# Patient Record
Sex: Female | Born: 1955 | Race: White | Hispanic: No | Marital: Married | Smoking: Current every day smoker
Health system: Southern US, Community
[De-identification: ages and names within clinical notes are randomized; demographics above are authoritative.]

## PROBLEM LIST (undated history)

## (undated) DIAGNOSIS — S065X9A Traumatic subdural hemorrhage with loss of consciousness of unspecified duration, initial encounter: Secondary | ICD-10-CM

## (undated) DIAGNOSIS — Z720 Tobacco use: Secondary | ICD-10-CM

## (undated) DIAGNOSIS — F329 Major depressive disorder, single episode, unspecified: Secondary | ICD-10-CM

## (undated) DIAGNOSIS — F99 Mental disorder, not otherwise specified: Secondary | ICD-10-CM

## (undated) DIAGNOSIS — F32A Depression, unspecified: Secondary | ICD-10-CM

## (undated) DIAGNOSIS — R4182 Altered mental status, unspecified: Secondary | ICD-10-CM

## (undated) DIAGNOSIS — J449 Chronic obstructive pulmonary disease, unspecified: Secondary | ICD-10-CM

## (undated) DIAGNOSIS — F1310 Sedative, hypnotic or anxiolytic abuse, uncomplicated: Secondary | ICD-10-CM

## (undated) DIAGNOSIS — G458 Other transient cerebral ischemic attacks and related syndromes: Secondary | ICD-10-CM

## (undated) DIAGNOSIS — K589 Irritable bowel syndrome without diarrhea: Secondary | ICD-10-CM

## (undated) DIAGNOSIS — F419 Anxiety disorder, unspecified: Secondary | ICD-10-CM

## (undated) DIAGNOSIS — R569 Unspecified convulsions: Secondary | ICD-10-CM

## (undated) DIAGNOSIS — D496 Neoplasm of unspecified behavior of brain: Secondary | ICD-10-CM

## (undated) HISTORY — PX: DILATION AND CURETTAGE OF UTERUS: SHX78

## (undated) HISTORY — PX: CARDIAC CATHETERIZATION: SHX172

## (undated) HISTORY — PX: LOOP RECORDER IMPLANT: SHX5954

## (undated) HISTORY — PX: VAGINAL HYSTERECTOMY: SUR661

## (undated) HISTORY — PX: TUBAL LIGATION: SHX77

## (undated) HISTORY — PX: CORONARY ANGIOPLASTY WITH STENT PLACEMENT: SHX49

---

## 2007-02-02 HISTORY — PX: SUBDURAL HEMATOMA EVACUATION VIA CRANIOTOMY: SUR319

## 2010-02-01 DIAGNOSIS — S065X9A Traumatic subdural hemorrhage with loss of consciousness of unspecified duration, initial encounter: Secondary | ICD-10-CM

## 2010-02-01 DIAGNOSIS — D496 Neoplasm of unspecified behavior of brain: Secondary | ICD-10-CM

## 2010-02-01 DIAGNOSIS — S065XAA Traumatic subdural hemorrhage with loss of consciousness status unknown, initial encounter: Secondary | ICD-10-CM

## 2010-02-01 HISTORY — DX: Neoplasm of unspecified behavior of brain: D49.6

## 2010-02-01 HISTORY — DX: Traumatic subdural hemorrhage with loss of consciousness of unspecified duration, initial encounter: S06.5X9A

## 2010-02-01 HISTORY — DX: Traumatic subdural hemorrhage with loss of consciousness status unknown, initial encounter: S06.5XAA

## 2012-01-02 HISTORY — PX: BRAIN SURGERY: SHX531

## 2012-02-02 HISTORY — PX: SUBDURAL HEMATOMA EVACUATION VIA CRANIOTOMY: SUR319

## 2012-05-23 ENCOUNTER — Observation Stay (HOSPITAL_COMMUNITY)
Admission: EM | Admit: 2012-05-23 | Discharge: 2012-05-24 | Disposition: A | Discharge status: Home / Self Care | Payer: Medicare Other | Attending: Family Medicine | Admitting: Family Medicine

## 2012-05-23 ENCOUNTER — Emergency Department (HOSPITAL_COMMUNITY): Payer: Medicare Other

## 2012-05-23 ENCOUNTER — Encounter (HOSPITAL_COMMUNITY): Payer: Self-pay | Admitting: *Deleted

## 2012-05-23 DIAGNOSIS — K5900 Constipation, unspecified: Secondary | ICD-10-CM | POA: Insufficient documentation

## 2012-05-23 DIAGNOSIS — J4489 Other specified chronic obstructive pulmonary disease: Secondary | ICD-10-CM | POA: Insufficient documentation

## 2012-05-23 DIAGNOSIS — R4182 Altered mental status, unspecified: Secondary | ICD-10-CM

## 2012-05-23 DIAGNOSIS — S0003XA Contusion of scalp, initial encounter: Secondary | ICD-10-CM | POA: Insufficient documentation

## 2012-05-23 DIAGNOSIS — Y92009 Unspecified place in unspecified non-institutional (private) residence as the place of occurrence of the external cause: Secondary | ICD-10-CM | POA: Insufficient documentation

## 2012-05-23 DIAGNOSIS — F1210 Cannabis abuse, uncomplicated: Secondary | ICD-10-CM | POA: Insufficient documentation

## 2012-05-23 DIAGNOSIS — J449 Chronic obstructive pulmonary disease, unspecified: Secondary | ICD-10-CM | POA: Insufficient documentation

## 2012-05-23 DIAGNOSIS — G40909 Epilepsy, unspecified, not intractable, without status epilepticus: Secondary | ICD-10-CM | POA: Insufficient documentation

## 2012-05-23 DIAGNOSIS — S81009A Unspecified open wound, unspecified knee, initial encounter: Secondary | ICD-10-CM | POA: Insufficient documentation

## 2012-05-23 DIAGNOSIS — S0083XA Contusion of other part of head, initial encounter: Secondary | ICD-10-CM | POA: Insufficient documentation

## 2012-05-23 DIAGNOSIS — F411 Generalized anxiety disorder: Secondary | ICD-10-CM

## 2012-05-23 DIAGNOSIS — F1920 Other psychoactive substance dependence, uncomplicated: Secondary | ICD-10-CM | POA: Diagnosis present

## 2012-05-23 DIAGNOSIS — R55 Syncope and collapse: Principal | ICD-10-CM | POA: Insufficient documentation

## 2012-05-23 DIAGNOSIS — W010XXA Fall on same level from slipping, tripping and stumbling without subsequent striking against object, initial encounter: Secondary | ICD-10-CM | POA: Insufficient documentation

## 2012-05-23 DIAGNOSIS — F1320 Sedative, hypnotic or anxiolytic dependence, uncomplicated: Secondary | ICD-10-CM | POA: Insufficient documentation

## 2012-05-23 HISTORY — DX: Neoplasm of unspecified behavior of brain: D49.6

## 2012-05-23 HISTORY — DX: Mental disorder, not otherwise specified: F99

## 2012-05-23 HISTORY — DX: Irritable bowel syndrome, unspecified: K58.9

## 2012-05-23 HISTORY — DX: Sedative, hypnotic or anxiolytic abuse, uncomplicated: F13.10

## 2012-05-23 HISTORY — DX: Traumatic subdural hemorrhage with loss of consciousness of unspecified duration, initial encounter: S06.5X9A

## 2012-05-23 HISTORY — DX: Chronic obstructive pulmonary disease, unspecified: J44.9

## 2012-05-23 HISTORY — DX: Tobacco use: Z72.0

## 2012-05-23 HISTORY — DX: Altered mental status, unspecified: R41.82

## 2012-05-23 HISTORY — DX: Other transient cerebral ischemic attacks and related syndromes: G45.8

## 2012-05-23 LAB — CBC WITH DIFFERENTIAL/PLATELET
Basophils Absolute: 0 10*3/uL (ref 0.0–0.1)
Lymphocytes Relative: 40 % (ref 12–46)
Neutro Abs: 2.6 10*3/uL (ref 1.7–7.7)
Neutrophils Relative %: 49 % (ref 43–77)
Platelets: 165 10*3/uL (ref 150–400)
RBC: 4.5 MIL/uL (ref 3.87–5.11)
RDW: 15.3 % (ref 11.5–15.5)
WBC: 5.4 10*3/uL (ref 4.0–10.5)

## 2012-05-23 LAB — POCT I-STAT 3, ART BLOOD GAS (G3+)
TCO2: 33 mmol/L (ref 0–100)
pCO2 arterial: 52 mmHg — ABNORMAL HIGH (ref 35.0–45.0)
pH, Arterial: 7.384 (ref 7.350–7.450)
pO2, Arterial: 106 mmHg — ABNORMAL HIGH (ref 80.0–100.0)

## 2012-05-23 LAB — COMPREHENSIVE METABOLIC PANEL
ALT: 5 U/L (ref 0–35)
AST: 14 U/L (ref 0–37)
Alkaline Phosphatase: 90 U/L (ref 39–117)
CO2: 32 mEq/L (ref 19–32)
Calcium: 9.1 mg/dL (ref 8.4–10.5)
Chloride: 102 mEq/L (ref 96–112)
GFR calc non Af Amer: >90 mL/min (ref >90)
Potassium: 4.5 mEq/L (ref 3.5–5.1)
Sodium: 138 mEq/L (ref 135–145)
Total Bilirubin: 0.2 mg/dL — ABNORMAL LOW (ref 0.3–1.2)

## 2012-05-23 LAB — URINALYSIS, ROUTINE W REFLEX MICROSCOPIC
Bilirubin Urine: NEGATIVE
Glucose, UA: NEGATIVE mg/dL
Ketones, ur: 15 mg/dL — AB
Nitrite: NEGATIVE
Protein, ur: NEGATIVE mg/dL
pH: 5 (ref 5.0–8.0)

## 2012-05-23 LAB — RAPID URINE DRUG SCREEN, HOSP PERFORMED
Amphetamines: NOT DETECTED
Barbiturates: NOT DETECTED
Benzodiazepines: POSITIVE — AB
Cocaine: NOT DETECTED

## 2012-05-23 LAB — CREATININE, SERUM
GFR calc Af Amer: >90 mL/min (ref >90)
GFR calc non Af Amer: >90 mL/min (ref >90)

## 2012-05-23 LAB — VALPROIC ACID LEVEL: Valproic Acid Lvl: 46.1 ug/mL — ABNORMAL LOW (ref 50.0–100.0)

## 2012-05-23 LAB — CG4 I-STAT (LACTIC ACID): Lactic Acid, Venous: 1.07 mmol/L (ref 0.5–2.2)

## 2012-05-23 MED ORDER — SODIUM CHLORIDE 0.9 % IJ SOLN
3.0000 mL | Freq: Two times a day (BID) | INTRAMUSCULAR | Status: DC
  Administered 2012-05-24: 3 mL via INTRAVENOUS

## 2012-05-23 MED ORDER — IPRATROPIUM BROMIDE 0.02 % IN SOLN
0.5000 mg | Freq: Once | RESPIRATORY_TRACT | Status: AC
  Administered 2012-05-23: 0.5 mg via RESPIRATORY_TRACT
  Filled 2012-05-23: qty 2.5

## 2012-05-23 MED ORDER — LUBIPROSTONE 24 MCG PO CAPS
24.0000 ug | ORAL_CAPSULE | Freq: Two times a day (BID) | ORAL | Status: DC
  Administered 2012-05-24 (×2): 24 ug via ORAL
  Filled 2012-05-23 (×3): qty 1

## 2012-05-23 MED ORDER — ACETAMINOPHEN 650 MG RE SUPP: 650.0000 mg | Freq: Four times a day (QID) | RECTAL | Status: DC | PRN

## 2012-05-23 MED ORDER — ACETAMINOPHEN 325 MG PO TABS: 650.0000 mg | ORAL_TABLET | Freq: Four times a day (QID) | ORAL | Status: DC | PRN

## 2012-05-23 MED ORDER — HEPARIN SODIUM (PORCINE) 5000 UNIT/ML IJ SOLN
5000.0000 [IU] | Freq: Three times a day (TID) | INTRAMUSCULAR | Status: DC
  Administered 2012-05-23 – 2012-05-24 (×2): 5000 [IU] via SUBCUTANEOUS
  Filled 2012-05-23 (×5): qty 1

## 2012-05-23 MED ORDER — ALBUTEROL SULFATE (5 MG/ML) 0.5% IN NEBU
2.5000 mg | INHALATION_SOLUTION | Freq: Once | RESPIRATORY_TRACT | Status: AC
  Administered 2012-05-23: 2.5 mg via RESPIRATORY_TRACT
  Filled 2012-05-23: qty 0.5

## 2012-05-23 MED ORDER — DIVALPROEX SODIUM 500 MG PO DR TAB
500.0000 mg | DELAYED_RELEASE_TABLET | Freq: Three times a day (TID) | ORAL | Status: DC
  Administered 2012-05-23 – 2012-05-24 (×3): 500 mg via ORAL
  Filled 2012-05-23 (×4): qty 1

## 2012-05-23 MED ORDER — SODIUM CHLORIDE 0.9 % IV SOLN
INTRAVENOUS | Status: DC
  Administered 2012-05-23: 100 mL/h via INTRAVENOUS

## 2012-05-23 MED ORDER — NALOXONE HCL 0.4 MG/ML IJ SOLN
0.4000 mg | Freq: Once | INTRAMUSCULAR | Status: AC
  Administered 2012-05-23: 0.4 mg via INTRAVENOUS
  Filled 2012-05-23: qty 1

## 2012-05-23 MED ORDER — BUDESONIDE-FORMOTEROL FUMARATE 160-4.5 MCG/ACT IN AERO
2.0000 | INHALATION_SPRAY | Freq: Two times a day (BID) | RESPIRATORY_TRACT | Status: DC
  Filled 2012-05-23: qty 6

## 2012-05-23 NOTE — ED Notes (Signed)
Pt undressed, in gown, on monitor, continuous pulse oximetry, blood pressure cuff, oxygen [place] (2L) and placed in c-collar

## 2012-05-23 NOTE — H&P (Signed)
Family Medicine Teaching [facility] Admission History and Physical  Patient name: Wanda Griffin Medical record number: [id number] Date of birth: 1955/09/04 Age: 57 y.o. Gender: female  Primary Care Provider: No primary provider on file.  Chief Complaint: altered mental status History of Present Illness: Wanda Griffin is a 57 y.o. year old female with PMH of a brain tumor status post resection, seizure disorder, benzodiazepine abuse, and COPD presenting with decreased level of consciousness. Her husband is accompanying her in the ED and provides the history that she started to act differently on Sunday. Her behavior included being quieter and increased desire to sleep. This morning, he found her unresponsive in her bathroom, and she struck her right forehead and right knee. Therefore, he call the EMS for evaluation. The husband notes that the patient has a history of being hospitalized for episodes like this where she has altered mental status. He notes three admissions in the last six months, each at [facility]. He says that her physicians at [facility] initially thought she was on too high a level of medications for anxiety, pain and seizures. However, he believes that they decreased the doses, but it is still happening. He cannot provide a history of all medications that she takes, but says that she is prescribed Xanax and Tylenol # 3. He locks these medications in a safe, because she will take more than prescribed if not. He is concerned that she has taken extra Xanax tablets during the past 2 days, because he has been gone from the house. He also notes that she will get benzodiazepines from her son and will smoke marijuana if he is not around. He denies any history of drinking alcohol. She does have a history of seizures, but he doesn't know when the last seizure was. She also has a history of possible cardiac problems, for which she has an implantable loop monitor, but  does not know if she has every had any abnormal heart rhythms. He denies any recent known illness. The patient is very somnolent and cannot provider history.    Past Medical History: Past Medical History  Diagnosis Date  . Subdural hematoma   . Brain tumor   . Mental health disorder   . Asthma   . Hypercholesterolemia   . IBS (irritable bowel syndrome)     chronic constipation  . COPD (chronic obstructive pulmonary disease)   . 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 Tobacco abuse   . Benzodiazepine abuse   . Marijuana use     Past Surgical History: Past Surgical History  Procedure Laterality Date  . Brain surgery  01/2012    Tumor resection  . Loop recorder implant      Social History: History   Social History  . Marital Status: Single    Spouse Name: N/A    Number of Children: N/A  . Years of Education: N/A   Social History Main Topics  . Smoking status: Never Smoker   . Smokeless tobacco: Not on file  . Alcohol Use: No  . Drug Use: No  . Sexually Active: Not on file   Other Topics Concern  . Not on file   Social History Narrative  . No narrative on file    Family History: No family history on file.  Allergies: Allergies  Allergen Reactions  . Ampicillin   . Asa (Aspirin)   . Colchicine   . Toradol (Ketorolac  Tromethamine)     Current Facility-Administered Medications  Medication Dose Route Frequency Provider Last Rate Last Dose  . 0.9 %  sodium chloride infusion   Intravenous Continuous Laray Anger, DO 100 mL/hr at 05/23/12 1448 100 mL/hr at 05/23/12 1448   Current Outpatient Prescriptions  Medication Sig Dispense Refill  . ALPRAZolam (XANAX) 1 MG tablet Take 1 mg by mouth 2 (two) times daily.      . budesonide-formoterol (SYMBICORT) 160-4.5 MCG/ACT inhaler Inhale 2 puffs into the lungs 2 (two) times daily.      . divalproex (DEPAKOTE) 500 MG DR tablet Take 500 mg by mouth 3 (three) times daily.      Marland Kitchen  lubiprostone (AMITIZA) 24 MCG capsule Take 24 mcg by mouth 2 (two) times daily with a meal.      . QUEtiapine (SEROQUEL) 25 MG tablet Take 25 mg by mouth at bedtime.      . rosuvastatin (CRESTOR) 20 MG tablet Take 20 mg by mouth daily.      . triazolam (HALCION) 0.25 MG tablet Take 0.25 mg by mouth at bedtime as needed.       Review Of Systems: Per HPI with the following additions: intermittent "shakes", muscle aches - Per husband  Otherwise 12 point review of systems was performed and was unremarkable.  Physical Exam: Temp:  [98.2 F (36.8 C)] 98.2 F (36.8 C) (04/22 1200) Pulse Rate:  [75] 75 (04/22 1200) Resp:  [16] 16 (04/22 1200) BP: (114)/(46) 114/46 mmHg (04/22 1200) SpO2:  [92 %] 92 % (04/22 1200)   General: cachetic, somnolent but easily aroused, chronically ill appearing, non distressed HEENT: pupils ERRLA but mydriasis bilaterally Neck: well healed incision scar superior to left medial clavicle  Chest: Palpable subcutaneous implantable device Heart: S1, S2 normal, no murmur, rub or gallop, regular rate and rhythm Lungs: normal work of breathing, poor respiratory effort and low air movement in bases Abdomen: abdomen is soft without significant tenderness, masses, organomegaly or guarding Extremities: atrophied muscle bilaterally, no edema Skin: new abrasion on right patella, healing abrasions on left patella Neurology: patient alert and oriented to person but not place, date or situation; CN II-XII grossly intact, negative babinski, could not participate in cerebellar testing  Labs and Imaging:  Results for orders placed during the hospital encounter of 05/23/12 (from the past 24 hour(s))  URINE RAPID DRUG SCREEN (HOSP PERFORMED)     Status: Abnormal   Collection Time    05/23/12  1:59 PM      Result Value Range   Opiates POSITIVE (*) NONE DETECTED   Cocaine NONE DETECTED  NONE DETECTED   Benzodiazepines POSITIVE (*) NONE DETECTED   Amphetamines NONE DETECTED  NONE  DETECTED   Tetrahydrocannabinol POSITIVE (*) NONE DETECTED   Barbiturates NONE DETECTED  NONE DETECTED  URINALYSIS, ROUTINE W REFLEX MICROSCOPIC     Status: Abnormal   Collection Time    05/23/12  1:59 PM      Result Value Range   Color, Urine YELLOW  YELLOW   APPearance CLEAR  CLEAR   Specific Gravity, Urine 1.021  1.005 - 1.030   pH 5.0  5.0 - 8.0   Glucose, UA NEGATIVE  NEGATIVE mg/dL   Hgb urine dipstick NEGATIVE  NEGATIVE   Bilirubin Urine NEGATIVE  NEGATIVE   Ketones, ur 15 (*) NEGATIVE mg/dL   Protein, ur NEGATIVE  NEGATIVE mg/dL   Urobilinogen, UA 1.0  0.0 - 1.0 mg/dL   Nitrite NEGATIVE  NEGATIVE   Leukocytes,  UA NEGATIVE  NEGATIVE  ETHANOL     Status: None   Collection Time    05/23/12  2:15 PM      Result Value Range   Alcohol, Ethyl (B) <11  0 - 11 mg/dL  CBC WITH DIFFERENTIAL     Status: None   Collection Time    05/23/12  2:15 PM      Result Value Range   WBC 5.4  4.0 - 10.5 K/uL   RBC 4.50  3.87 - 5.11 MIL/uL   Hemoglobin 13.3  12.0 - 15.0 g/dL   HCT 08.6  57.8 - 46.9 %   MCV 92.9  78.0 - 100.0 fL   MCH 29.6  26.0 - 34.0 pg   MCHC 31.8  30.0 - 36.0 g/dL   RDW 62.9  52.8 - 41.3 %   Platelets 165  150 - 400 K/uL   Neutrophils Relative 49  43 - 77 %   Neutro Abs 2.6  1.7 - 7.7 K/uL   Lymphocytes Relative 40  12 - 46 %   Lymphs Abs 2.2  0.7 - 4.0 K/uL   Monocytes Relative 9  3 - 12 %   Monocytes Absolute 0.5  0.1 - 1.0 K/uL   Eosinophils Relative 2  0 - 5 %   Eosinophils Absolute 0.1  0.0 - 0.7 K/uL   Basophils Relative 0  0 - 1 %   Basophils Absolute 0.0  0.0 - 0.1 K/uL  COMPREHENSIVE METABOLIC PANEL     Status: Abnormal   Collection Time    05/23/12  2:15 PM      Result Value Range   Sodium 138  135 - 145 mEq/L   Potassium 4.5  3.5 - 5.1 mEq/L   Chloride 102  96 - 112 mEq/L   CO2 32  19 - 32 mEq/L   Glucose, Bld 78  70 - 99 mg/dL   BUN 11  6 - 23 mg/dL   Creatinine, Ser 2.44  0.50 - 1.10 mg/dL   Calcium 9.1  8.4 - 01.0 mg/dL   Total Protein 6.0   6.0 - 8.3 g/dL   Albumin 2.3 (*) 3.5 - 5.2 g/dL   AST 14  0 - 37 U/L   ALT 5  0 - 35 U/L   Alkaline Phosphatase 90  39 - 117 U/L   Total Bilirubin 0.2 (*) 0.3 - 1.2 mg/dL   GFR calc non Af Amer >90  >90 mL/min   GFR calc Af Amer >90  >90 mL/min  LIPASE, BLOOD     Status: Abnormal   Collection Time    05/23/12  2:15 PM      Result Value Range   Lipase 10 (*) 11 - 59 U/L  VALPROIC ACID LEVEL     Status: Abnormal   Collection Time    05/23/12  2:15 PM      Result Value Range   Valproic Acid Lvl 46.1 (*) 50.0 - 100.0 ug/mL  TROPONIN I     Status: None   Collection Time    05/23/12  2:18 PM      Result Value Range   Troponin I <0.30  <0.30 ng/mL  POCT I-STAT TROPONIN I     Status: None   Collection Time    05/23/12  2:34 PM      Result Value Range   Troponin i, poc 0.00  0.00 - 0.08 ng/mL   Comment 3  CG4 I-STAT (LACTIC ACID)     Status: None   Collection Time    05/23/12  2:59 PM      Result Value Range   Lactic Acid, Venous 1.07  0.5 - 2.2 mmol/L    Dg Chest 1 View  05/23/2012  *RADIOLOGY REPORT*  Clinical Data: Fall, chest soreness  CHEST - 1 VIEW  Comparison: None.  Findings: Implantable loop recorder projects over the left thorax. Multiple surgical clips are noted in the soft tissues of the left clavicular region.  There is a wall stent projecting over the left clavicular head, likely within the proximal left subclavian or carotid artery. Borderline cardiomegaly.  Mild pulmonary vascular congestion without overt edema.  Appear chronic.  No pneumothorax or large pleural effusion.  Surgical clips in the right upper quadrant suggest prior cholecystectomy.  No acute rib fracture identified.  IMPRESSION:  1.  Borderline cardiomegaly and central bronchitic changes likely reflects chronic processes. 2.  Mild pulmonary vascular congestion. 3.  Implantable loop recorder. 4.  Surgical changes in the left supraclavicular. 5.  Stent in the region of the proximal left common carotid  or subclavian artery.   Original Report Authenticated By: Malachy Moan, M.D.    Ct Head Wo Contrast  05/23/2012  *RADIOLOGY REPORT*  Clinical Data:  Fall, confusion.  History of brain tumor.  CT HEAD WITHOUT CONTRAST CT CERVICAL SPINE WITHOUT CONTRAST  Technique:  Multidetector CT imaging of the head and cervical spine was performed following the standard protocol without intravenous contrast.  Multiplanar CT image reconstructions of the cervical spine were also generated.  Comparison:   None  CT HEAD  Findings: Right frontal craniotomy defect noted. No acute intracranial hemorrhage.  No focal mass lesion.  No CT evidence of acute infarction.   No midline shift or mass effect.  No hydrocephalus.  Basilar cisterns are patent.  There is mild generalized cortical atrophy.  No skull fracture. Paranasal sinuses mastoid air cells are clear.  Left frontal craniotomy defect also noted.  IMPRESSION:  1.  No acute intracranial findings. 2.  Mild generalized atrophy. 3.  Bilateral frontal craniotomy defects  CT CERVICAL SPINE  Findings: No prevertebral soft tissue swelling.  Normal alignment of cervical vertebral bodies.  No loss of vertebral body height. Normal facet articulation.  Normal craniocervical junction.  No evidence epidural or paraspinal hematoma. Vascular stents within the left supraclavicular neck.  IMPRESSION: No evidence cervical spine fracture.   Original Report Authenticated By: Genevive Bi, M.D.    Ct Cervical Spine Wo Contrast  05/23/2012  *RADIOLOGY REPORT*  Clinical Data:  Fall, confusion.  History of brain tumor.  CT HEAD WITHOUT CONTRAST CT CERVICAL SPINE WITHOUT CONTRAST  Technique:  Multidetector CT imaging of the head and cervical spine was performed following the standard protocol without intravenous contrast.  Multiplanar CT image reconstructions of the cervical spine were also generated.  Comparison:   None  CT HEAD  Findings: Right frontal craniotomy defect noted. No acute  intracranial hemorrhage.  No focal mass lesion.  No CT evidence of acute infarction.   No midline shift or mass effect.  No hydrocephalus.  Basilar cisterns are patent.  There is mild generalized cortical atrophy.  No skull fracture. Paranasal sinuses mastoid air cells are clear.  Left frontal craniotomy defect also noted.  IMPRESSION:  1.  No acute intracranial findings. 2.  Mild generalized atrophy. 3.  Bilateral frontal craniotomy defects  CT CERVICAL SPINE  Findings: No prevertebral soft tissue swelling.  Normal  alignment of cervical vertebral bodies.  No loss of vertebral body height. Normal facet articulation.  Normal craniocervical junction.  No evidence epidural or paraspinal hematoma. Vascular stents within the left supraclavicular neck.  IMPRESSION: No evidence cervical spine fracture.   Original Report Authenticated By: Genevive Bi, M.D.    Dg Knee Complete 4 Views Left  05/23/2012  *RADIOLOGY REPORT*  Clinical Data: Fall  LEFT KNEE - COMPLETE 4+ VIEW  Comparison: Concurrently obtained radiographs of the right knee  Findings: No acute fracture, malalignment or joint effusion.  No significant degenerative changes.  IMPRESSION: No acute fracture or malalignment.   Original Report Authenticated By: Malachy Moan, M.D.    Dg Knee Complete 4 Views Right  05/23/2012  *RADIOLOGY REPORT*  Clinical Data: Lacerations to the knees secondary to a fall.  RIGHT KNEE - COMPLETE 4+ VIEW  Comparison: None.  Findings: There is no fracture, dislocation, arthritis, joint effusion, or radiodense foreign body in the soft tissues.  IMPRESSION: Normal exam.   Original Report Authenticated By: Francene Boyers, M.D.       Assessment and Plan: Wanda Griffin is a 57 y.o. year old female with a history of brain tumor s/p resection 2013, seizure disorder, anxiety disorder and benzodiazepine abuse presenting with altered mental status  # Altered Mental Status - Most likely due to benzo abuse given history and +  UDS; possible post-ictal and less likely is metabolic encephalopathy, CVA or recurrent tumor - Mental status improving, but hold NPO - Seizure precautions - Continue current dose of Depakote - Decrease alprazolam dose but give to prevent withdrawal - Check ammonia level  # Seizure Disorder - Very poor history about this - No EEG warranted at this time - Continue Depakote  # Psych - Anxiety disorder - Cont alprazolam at 0.5 mg BID to prevent withdrawal - Hold halcion and seroquel  # Vascular Disease - S/p stent of left common carotid vs subclavian - Hold crestor tonight, restart tomorrow  # Chronic Constipation - Cont lubiprostone   # COPD - Cont symbicort   FENGI - NS @ 159ml/he PPX - Heparin SQ DISPO - Admit to telemetry   Rahim Astorga V. Clinton Sawyer, MD, MBA 05/23/2012, 6:05 PM Family Medicine Resident, PGY-2 [phone] pager

## 2012-05-23 NOTE — ED Notes (Signed)
Family has just arrived.  States every month pt has an episode where she "talks out of her head" and has to be admitted and have her meds changed.  State all of her MD'S are at [facility].

## 2012-05-23 NOTE — ED Notes (Signed)
Pt started choking and sats dropped to 86.  Pt suction and replaced on O2 she removed.  Thick white mucus suctioned.  Pt sats back to 96 on 2L.  Suction remains at bedside.

## 2012-05-23 NOTE — ED Notes (Signed)
RT paged for blood gas

## 2012-05-23 NOTE — ED Provider Notes (Signed)
History     CSN: [id number]  Arrival date & time 05/23/12  1144   First MD Initiated Contact with Patient 05/23/12 1204      Chief Complaint  Patient presents with  . Fall  . Altered Mental Status     Patient is a 57 y.o. female presenting with fall and altered mental status. The history is provided by the EMS personnel and the spouse. The history is limited by the condition of the patient (AMS).  Fall  Altered Mental Status  Pt was seen at 1205.  Per EMS, friend and family report, pt found on the floor of the bathroom at home by her husband this morning PTA.  Pt's family feels she has been "confused" from her baseline.  Denies recent N/V/D, no cough, no fevers.    Td UTD (11/2011) Past Medical History  Diagnosis Date  . Subdural hematoma   . Brain tumor   . Mental health disorder   . Asthma   . Hypercholesterolemia   . IBS (irritable bowel syndrome)     chronic constipation    Past Surgical History  Procedure Laterality Date  . Pacemaker insertion    . Brain surgery  01/2012  . Loop recorder implant       History  Substance Use Topics  . Smoking status: Never Smoker   . Smokeless tobacco: Not on file  . Alcohol Use: No    Review of Systems  Unable to perform ROS: Mental status change  Psychiatric/Behavioral: Positive for altered mental status.    Allergies  Ampicillin; Asa; Colchicine; and Toradol  Home Medications   Current Outpatient Rx  Name  Route  Sig  Dispense  Refill  . ALPRAZolam (XANAX) 1 MG tablet   Oral   Take 1 mg by mouth 2 (two) times daily.         . budesonide-formoterol (SYMBICORT) 160-4.5 MCG/ACT inhaler   Inhalation   Inhale 2 puffs into the lungs 2 (two) times daily.         . divalproex (DEPAKOTE) 500 MG DR tablet   Oral   Take 500 mg by mouth 3 (three) times daily.         Marland Kitchen lubiprostone (AMITIZA) 24 MCG capsule   Oral   Take 24 mcg by mouth 2 (two) times daily with a meal.         . QUEtiapine (SEROQUEL) 25  MG tablet   Oral   Take 25 mg by mouth at bedtime.         . rosuvastatin (CRESTOR) 20 MG tablet   Oral   Take 20 mg by mouth daily.         . triazolam (HALCION) 0.25 MG tablet   Oral   Take 0.25 mg by mouth at bedtime as needed.           BP 114/46  Pulse 75  Temp(Src) 98.2 F (36.8 C) (Oral)  Resp 16  SpO2 92%  Physical Exam 1210: Physical examination:  Nursing notes reviewed; Vital signs and O2 SAT reviewed;  Constitutional: Well developed, Well nourished, In no acute distress; Head:  Normocephalic, +right forehead hematoma with small very superficial hemostatic abrasion.; Eyes: EOMI, pupils pinpoint, No scleral icterus; ENMT: Mouth and pharynx normal, Mucous membranes dry; Neck: C-collar placed in ED, trachea midline, No lymphadenopathy; Cardiovascular: Regular rate and rhythm, No gallop; Respiratory: Breath sounds clear & equal bilaterally, No wheezes.  Speaking full sentences with ease, Normal respiratory effort/excursion; Chest: Nontender, Movement normal;  Abdomen: Soft, Nontender, Nondistended, Normal bowel sounds; Genitourinary: No CVA tenderness; Spine:  No midline CS, TS, LS tenderness.;; Extremities: Pulses normal, No tenderness, +FROM bilat knees, including able to lift extended bilat LE off stretcher, and extend bilat lower legs against resistance.  No ligamentous laxity.  No patellar or quad tendon step-offs.  NMS intact bilat feet, strong pedal pp. +plantarflexion of bilat feet w/calf squeeze.  No palpable gap right and left Achilles's tendons.  No bilat proximal fibular head tenderness. Bilat knees with superficial hemostatic abrasions and without edema, erythema, warmth, ecchymosis or deformity bilat knees. NT bilat hips/knees/ankles/feet. Pelvis stable. No calf edema or asymmetry.; Neuro: Lethargic, confused re: events PTA. No facial droop, holds her eyes closed, speech slurred. Moves all ext on stretcher spontaneously and to command. Grips equal, strength 5/5 equal  bilat UE's and LE's.; Skin: Color normal, Warm, Dry.   ED Course  Procedures   1345:  CT-H without acute findings.  Pt continues lethargic, will not answer questions. Will proceed with workup.     MDM  MDM Reviewed: nursing note and vitals Interpretation: labs, ECG, x-ray and CT scan    Date: 05/23/2012  Rate: 78  Rhythm: normal sinus rhythm  QRS Axis: normal  Intervals: normal  ST/T Wave abnormalities: normal  Conduction Disutrbances:none  Narrative Interpretation: artifact, baseline wander  Old EKG Reviewed: none available.  Results for orders placed during the hospital encounter of 05/23/12  ETHANOL      Result Value Range   Alcohol, Ethyl (B) <11  0 - 11 mg/dL  URINE RAPID DRUG SCREEN (HOSP PERFORMED)      Result Value Range   Opiates POSITIVE (*) NONE DETECTED   Cocaine NONE DETECTED  NONE DETECTED   Benzodiazepines POSITIVE (*) NONE DETECTED   Amphetamines NONE DETECTED  NONE DETECTED   Tetrahydrocannabinol POSITIVE (*) NONE DETECTED   Barbiturates NONE DETECTED  NONE DETECTED  URINALYSIS, ROUTINE W REFLEX MICROSCOPIC      Result Value Range   Color, Urine YELLOW  YELLOW   APPearance CLEAR  CLEAR   Specific Gravity, Urine 1.021  1.005 - 1.030   pH 5.0  5.0 - 8.0   Glucose, UA NEGATIVE  NEGATIVE mg/dL   Hgb urine dipstick NEGATIVE  NEGATIVE   Bilirubin Urine NEGATIVE  NEGATIVE   Ketones, ur 15 (*) NEGATIVE mg/dL   Protein, ur NEGATIVE  NEGATIVE mg/dL   Urobilinogen, UA 1.0  0.0 - 1.0 mg/dL   Nitrite NEGATIVE  NEGATIVE   Leukocytes, UA NEGATIVE  NEGATIVE  CBC WITH DIFFERENTIAL      Result Value Range   WBC 5.4  4.0 - 10.5 K/uL   RBC 4.50  3.87 - 5.11 MIL/uL   Hemoglobin 13.3  12.0 - 15.0 g/dL   HCT 78.2  95.6 - 21.3 %   MCV 92.9  78.0 - 100.0 fL   MCH 29.6  26.0 - 34.0 pg   MCHC 31.8  30.0 - 36.0 g/dL   RDW 08.6  57.8 - 46.9 %   Platelets 165  150 - 400 K/uL   Neutrophils Relative 49  43 - 77 %   Neutro Abs 2.6  1.7 - 7.7 K/uL   Lymphocytes Relative  40  12 - 46 %   Lymphs Abs 2.2  0.7 - 4.0 K/uL   Monocytes Relative 9  3 - 12 %   Monocytes Absolute 0.5  0.1 - 1.0 K/uL   Eosinophils Relative 2  0 - 5 %   Eosinophils  Absolute 0.1  0.0 - 0.7 K/uL   Basophils Relative 0  0 - 1 %   Basophils Absolute 0.0  0.0 - 0.1 K/uL  COMPREHENSIVE METABOLIC PANEL      Result Value Range   Sodium 138  135 - 145 mEq/L   Potassium 4.5  3.5 - 5.1 mEq/L   Chloride 102  96 - 112 mEq/L   CO2 32  19 - 32 mEq/L   Glucose, Bld 78  70 - 99 mg/dL   BUN 11  6 - 23 mg/dL   Creatinine, Ser 1.61  0.50 - 1.10 mg/dL   Calcium 9.1  8.4 - 09.6 mg/dL   Total Protein 6.0  6.0 - 8.3 g/dL   Albumin 2.3 (*) 3.5 - 5.2 g/dL   AST 14  0 - 37 U/L   ALT 5  0 - 35 U/L   Alkaline Phosphatase 90  39 - 117 U/L   Total Bilirubin 0.2 (*) 0.3 - 1.2 mg/dL   GFR calc non Af Amer >90  >90 mL/min   GFR calc Af Amer >90  >90 mL/min  LIPASE, BLOOD      Result Value Range   Lipase 10 (*) 11 - 59 U/L  TROPONIN I      Result Value Range   Troponin I <0.30  <0.30 ng/mL  VALPROIC ACID LEVEL      Result Value Range   Valproic Acid Lvl 46.1 (*) 50.0 - 100.0 ug/mL  POCT I-STAT TROPONIN I      Result Value Range   Troponin i, poc 0.00  0.00 - 0.08 ng/mL   Comment 3           CG4 I-STAT (LACTIC ACID)      Result Value Range   Lactic Acid, Venous 1.07  0.5 - 2.2 mmol/L   Dg Chest 1 View 05/23/2012  *RADIOLOGY REPORT*  Clinical Data: Fall, chest soreness  CHEST - 1 VIEW  Comparison: None.  Findings: Implantable loop recorder projects over the left thorax. Multiple surgical clips are noted in the soft tissues of the left clavicular region.  There is a wall stent projecting over the left clavicular head, likely within the proximal left subclavian or carotid artery. Borderline cardiomegaly.  Mild pulmonary vascular congestion without overt edema.  Appear chronic.  No pneumothorax or large pleural effusion.  Surgical clips in the right upper quadrant suggest prior cholecystectomy.  No acute rib  fracture identified.  IMPRESSION:  1.  Borderline cardiomegaly and central bronchitic changes likely reflects chronic processes. 2.  Mild pulmonary vascular congestion. 3.  Implantable loop recorder. 4.  Surgical changes in the left supraclavicular. 5.  Stent in the region of the proximal left common carotid or subclavian artery.   Original Report Authenticated By: Malachy Moan, M.D.    Ct Head Wo Contrast 05/23/2012  *RADIOLOGY REPORT*  Clinical Data:  Fall, confusion.  History of brain tumor.  CT HEAD WITHOUT CONTRAST CT CERVICAL SPINE WITHOUT CONTRAST  Technique:  Multidetector CT imaging of the head and cervical spine was performed following the standard protocol without intravenous contrast.  Multiplanar CT image reconstructions of the cervical spine were also generated.  Comparison:   None  CT HEAD  Findings: Right frontal craniotomy defect noted. No acute intracranial hemorrhage.  No focal mass lesion.  No CT evidence of acute infarction.   No midline shift or mass effect.  No hydrocephalus.  Basilar cisterns are patent.  There is mild generalized cortical atrophy.  No  skull fracture. Paranasal sinuses mastoid air cells are clear.  Left frontal craniotomy defect also noted.  IMPRESSION:  1.  No acute intracranial findings. 2.  Mild generalized atrophy. 3.  Bilateral frontal craniotomy defects  CT CERVICAL SPINE  Findings: No prevertebral soft tissue swelling.  Normal alignment of cervical vertebral bodies.  No loss of vertebral body height. Normal facet articulation.  Normal craniocervical junction.  No evidence epidural or paraspinal hematoma. Vascular stents within the left supraclavicular neck.  IMPRESSION: No evidence cervical spine fracture.   Original Report Authenticated By: Genevive Bi, M.D.    Ct Cervical Spine Wo Contrast 05/23/2012  *RADIOLOGY REPORT*  Clinical Data:  Fall, confusion.  History of brain tumor.  CT HEAD WITHOUT CONTRAST CT CERVICAL SPINE WITHOUT CONTRAST  Technique:   Multidetector CT imaging of the head and cervical spine was performed following the standard protocol without intravenous contrast.  Multiplanar CT image reconstructions of the cervical spine were also generated.  Comparison:   None  CT HEAD  Findings: Right frontal craniotomy defect noted. No acute intracranial hemorrhage.  No focal mass lesion.  No CT evidence of acute infarction.   No midline shift or mass effect.  No hydrocephalus.  Basilar cisterns are patent.  There is mild generalized cortical atrophy.  No skull fracture. Paranasal sinuses mastoid air cells are clear.  Left frontal craniotomy defect also noted.  IMPRESSION:  1.  No acute intracranial findings. 2.  Mild generalized atrophy. 3.  Bilateral frontal craniotomy defects  CT CERVICAL SPINE  Findings: No prevertebral soft tissue swelling.  Normal alignment of cervical vertebral bodies.  No loss of vertebral body height. Normal facet articulation.  Normal craniocervical junction.  No evidence epidural or paraspinal hematoma. Vascular stents within the left supraclavicular neck.  IMPRESSION: No evidence cervical spine fracture.   Original Report Authenticated By: Genevive Bi, M.D.    Dg Knee Complete 4 Views Left 05/23/2012  *RADIOLOGY REPORT*  Clinical Data: Fall  LEFT KNEE - COMPLETE 4+ VIEW  Comparison: Concurrently obtained radiographs of the right knee  Findings: No acute fracture, malalignment or joint effusion.  No significant degenerative changes.  IMPRESSION: No acute fracture or malalignment.   Original Report Authenticated By: Malachy Moan, M.D.    Dg Knee Complete 4 Views Right 05/23/2012  *RADIOLOGY REPORT*  Clinical Data: Lacerations to the knees secondary to a fall.  RIGHT KNEE - COMPLETE 4+ VIEW  Comparison: None.  Findings: There is no fracture, dislocation, arthritis, joint effusion, or radiodense foreign body in the soft tissues.  IMPRESSION: Normal exam.   Original Report Authenticated By: Francene Boyers, M.D.     1615:   Pt continues with AMS, resps without distress.  Will awaken to name and then go back to sleep. VS remain stable. Pt's friend and pt's husband at bedside states pt "does this once a month like a cycle."  States she has been evaluated at [facility] previously for same. Care Everywhere records accessed: no records found for this pt name.  Pt's husband he gives pt her meds (they are under lock and key) but both pt's husband and friend both "still worry she may hide some medicine in her room."  Pt's UDS +THC, benzos, and opiates and pt's friend and husband state pt is not on any narcotic meds.  IV narcan given with partial effect.  Dx and testing d/w pt's friend and family.  Questions answered.  Verb understanding, agreeable to admit. T/C to [facility] Resident, case discussed, including:  HPI, pertinent  PM/SHx, VS/PE, dx testing, ED course and treatment:  Agreeable to admit, requests they will come to ED for eval.            Laray Anger, DO 05/25/12 1748

## 2012-05-23 NOTE — ED Notes (Addendum)
Patient was by husband in the bathroom on the floor, tripped and hit her head and has a hematoma to her right forehead. EMS did not collar or board patient. Patient has a history of head bleeds and brain tumor. Patient is confused.

## 2012-05-23 NOTE — ED Notes (Signed)
Pt transported to CT at this time.

## 2012-05-23 NOTE — H&P (Signed)
I have seen and examined this patient with Dr Clinton Sawyer . I have discussed with Dr Clinton Sawyer.  I agree with their findings and plans as documented in their admission note.

## 2012-05-24 DIAGNOSIS — R4182 Altered mental status, unspecified: Secondary | ICD-10-CM

## 2012-05-24 DIAGNOSIS — F1920 Other psychoactive substance dependence, uncomplicated: Secondary | ICD-10-CM | POA: Diagnosis present

## 2012-05-24 DIAGNOSIS — R55 Syncope and collapse: Secondary | ICD-10-CM | POA: Diagnosis present

## 2012-05-24 DIAGNOSIS — G40909 Epilepsy, unspecified, not intractable, without status epilepticus: Secondary | ICD-10-CM | POA: Diagnosis present

## 2012-05-24 LAB — PROTIME-INR
INR: 1 (ref 0.00–1.49)
Prothrombin Time: 13.1 seconds (ref 11.6–15.2)

## 2012-05-24 LAB — BASIC METABOLIC PANEL
Calcium: 8.4 mg/dL (ref 8.4–10.5)
Creatinine, Ser: 0.72 mg/dL (ref 0.50–1.10)
GFR calc Af Amer: >90 mL/min (ref >90)
Sodium: 136 mEq/L (ref 135–145)

## 2012-05-24 LAB — CBC
Platelets: 188 10*3/uL (ref 150–400)
RBC: 4.15 MIL/uL (ref 3.87–5.11)
RDW: 15.3 % (ref 11.5–15.5)
WBC: 5 10*3/uL (ref 4.0–10.5)

## 2012-05-24 LAB — AMMONIA: Ammonia: 24 umol/L (ref 11–60)

## 2012-05-24 LAB — URINE CULTURE: Colony Count: NO GROWTH

## 2012-05-24 MED ORDER — ALPRAZOLAM 0.5 MG PO TABS
0.5000 mg | ORAL_TABLET | Freq: Two times a day (BID) | ORAL | Status: DC
  Administered 2012-05-24: 0.5 mg via ORAL
  Filled 2012-05-24: qty 1

## 2012-05-24 NOTE — Discharge Summary (Signed)
Physician Discharge Summary  Patient ID: Wanda Griffin MRN: [id number] DOB/AGE: 05-29-1955 57 y.o.  Admit date: 05/23/2012 Discharge date: 05/24/2012  Admission Diagnoses: Syncope with altered mental status  Discharge Diagnoses:  Principal Problem:   Altered mental status Active Problems:   Seizure disorder   Syncope and collapse   Polysubstance (excluding opioids) dependence, daily use   Discharged Condition: fair  Hospital Course:  Wanda Griffin is a 57 y.o. year old female with a history of brain tumor s/p resection 2013, seizure disorder, anxiety disorder and benzodiazepine abuse that presented with altered mental status  Altered Mental Status - Most likely due to benzo abuse given history and + UDS. Family reported this type of event occurs at least once a month and the patient is typically worked up at [employer]. During admission her Depakote was continued and a decreased alprazolam dose was given to allow some clarity, but prevent withdrawal. Ammonia, CBC and CMP were normal, with the exception of low albumin.  Hgb was stable and remained stable. CT was unchanged from prior readings. On day of discharge patients mental status had improved greatly and was back to her baseline.  Seizure Disorder - Very poor history, likely from brain resections, but unknown etiology. Continued Depakote  Psych - Anxiety disorder. Cont alprazolam at 0.5 mg BID to prevent withdrawal. Held halcion and seroquel for sedating effects, but continued them on discharge.  Vascular Disease - S/p stent of left common carotid vs subclavian. Crestor held first night and then given. Chronic Constipation - Cont lubiprostone  COPD - Cont symbicort   Consults: None  Significant Diagnostic Studies: Dg Chest 1 View 05/23/2012  IMPRESSION:  1.  Borderline cardiomegaly and central bronchitic changes likely reflects chronic processes. 2.  Mild pulmonary vascular congestion. 3.  Implantable loop recorder. 4.   Surgical changes in the left supraclavicular. 5.  Stent in the region of the proximal left common carotid or subclavian artery.   Original Report Authenticated By: Malachy Moan, M.D.    Ct Head Wo Contrast 05/23/2012  IMPRESSION: No evidence cervical spine fracture.   Original Report Authenticated By: Genevive Bi, M.D.    Ct Cervical Spine Wo Contrast 05/23/2012   IMPRESSION: No evidence cervical spine fracture.   Original Report Authenticated By: Genevive Bi, M.D.    Dg Knee Complete 4 Views Left 05/23/2012   Findings: No acute fracture, malalignment or joint effusion.  No significant degenerative changes.  IMPRESSION: No acute fracture or malalignment.   Original Report Authenticated By: Malachy Moan, M.D.    Dg Knee Complete 4 Views Right 05/23/2012    Findings: There is no fracture, dislocation, arthritis, joint effusion, or radiodense foreign body in the soft tissues.  IMPRESSION: Normal exam.   Original Report Authenticated By: Francene Boyers, M.D.    Treatments: IV hydration, weaned of medications that may cause confusion.   Discharge Exam: Blood pressure 100/40, pulse 74, temperature 98.7 F (37.1 C), temperature source Oral, resp. rate 16, height 5\' 2"  (1.575 m), weight 102 lb 11.2 oz (46.584 kg), SpO2 90.00%. Gen: NAD, alert, cooperative with exam. Chronically ill appearing. Alert to person, birth date, building (Called it [facility]), time  HEENT: [place]. Abrasions to right temporal area (healing), EOMI. Well healed incision scar to left medical clavicle  Chest: Palpable subcutaneous implantable device  CV: RRR. No murmur  Resp: CTAB, non labored, good air movement.  Abd: Soft. NTND. BS present  Ext: No erythema or edema. +2/4 pulses bilaterally.  Skin: No rashes noted.  Well perfused. New abrasion on right patella. Healing abrasions on left patella.  Neuro: Alert and oriented, No gross deficits. Walking in room. Family reports she is at her  baseline   Disposition: 01-Home or Self Care      Discharge Orders   Future Orders Complete By Expires     Diet - low sodium heart healthy  As directed     Discharge instructions  As directed     Comments:      Patient  Needs to make an appointment to follow up with her primary care physician in 1 week. Did not order to continue the triazolam, she can talk to her primary physician about this if she feels she needs it continued.    Increase activity slowly  As directed         Medication List    STOP taking these medications       triazolam 0.25 MG tablet  Commonly known as:  HALCION      TAKE these medications       ALPRAZolam 1 MG tablet  Commonly known as:  XANAX  Take 1 mg by mouth 2 (two) times daily.     budesonide-formoterol 160-4.5 MCG/ACT inhaler  Commonly known as:  SYMBICORT  Inhale 2 puffs into the lungs 2 (two) times daily.     divalproex 500 MG DR tablet  Commonly known as:  DEPAKOTE  Take 500 mg by mouth 3 (three) times daily.     lubiprostone 24 MCG capsule  Commonly known as:  AMITIZA  Take 24 mcg by mouth 2 (two) times daily with a meal.     QUEtiapine 25 MG tablet  Commonly known as:  SEROQUEL  Take 25 mg by mouth at bedtime.     rosuvastatin 20 MG tablet  Commonly known as:  CRESTOR  Take 20 mg by mouth daily.       Follow-up Information   Follow up with Jearld Lesch, MD. Schedule an appointment as soon as possible for a visit in 1 week.   Contact information:   [address] [place] [address] [phone]      Outpatient recommendations: - Counseling on Benzodiazepine dependence and polysubstance abuse Signed: Felix Pacini 05/25/2012, 1:38 PM

## 2012-05-24 NOTE — Progress Notes (Signed)
I discussed with Dr Claiborne Billings.  I agree with their plans documented in their note for today.

## 2012-05-24 NOTE — H&P (Signed)
I have seen and examined this patient with Dr Williamson . I have discussed with Dr Williamson.  I agree with their findings and plans as documented in their admission note.     

## 2012-05-24 NOTE — Progress Notes (Signed)
Patient ID: Wanda Griffin, female   DOB: Jan 07, 1956, 57 y.o.   MRN: [id number] Family Medicine Teaching Service Daily Progress Note Service Page: [phone]   Subjective:  Patient is alone in  her room today. She answers questions appropriately. She has been able to walk to the bathroom without event, and has tolerated meals. She reports she had a large, but normal BM last night.   Objective: Temp:  [98.2 F (36.8 C)-98.8 F (37.1 C)] 98.8 F (37.1 C) (04/23 0557) Pulse Rate:  [70-80] 78 (04/23 0557) Cardiac Rhythm:  [-] Normal sinus rhythm (04/22 2000) Resp:  [16-25] 18 (04/23 0557) BP: (90-114)/(36-50) 90/42 mmHg (04/23 0557) SpO2:  [91 %-100 %] 93 % (04/23 0557) Weight:  [102 lb 11.2 oz (46.584 kg)] 102 lb 11.2 oz (46.584 kg) (04/22 2013) No intake or output data in the 24 hours ending 05/24/12 1003  Exam: Gen: NAD, alert, cooperative with exam. Chronically ill appearing. Alert to person, birth date, building (Called it [facility]), time HEENT: [place]. Abrasions to right temporal area (healing), EOMI. Well healed incision scar to left medical clavicle Chest: Palpable subcutaneous implantable device  CV: RRR. No murmur Resp: CTAB, non labored, good air movement.  Abd: Soft. NTND. BS present Ext: No erythema or edema. +2/4 pulses bilaterally.  Skin: No rashes noted. Well perfused. New abrasion on right patella. Healing abrasions on left patella.  Neuro: Alert and oriented, No gross deficits. Walking in room. Family reports she is at her baseline  I have reviewed the patient's medications, labs, imaging, and diagnostic testing.  Notable results are summarized below. CBC    Component Value Date/Time   WBC 5.0 05/24/2012 0606   RBC 4.15 05/24/2012 0606   HGB 11.9* 05/24/2012 0606   HCT 38.2 05/24/2012 0606   PLT 188 05/24/2012 0606   MCV 92.0 05/24/2012 0606   MCH 28.7 05/24/2012 0606   MCHC 31.2 05/24/2012 0606   RDW 15.3 05/24/2012 0606   LYMPHSABS 2.2 05/23/2012 1415   MONOABS 0.5 05/23/2012 1415   EOSABS 0.1 05/23/2012 1415   BASOSABS 0.0 05/23/2012 1415     CMP     Component Value Date/Time   NA 136 05/24/2012 0606   K 4.8 05/24/2012 0606   CL 101 05/24/2012 0606   CO2 24 05/24/2012 0606   GLUCOSE 57* 05/24/2012 0606   BUN 16 05/24/2012 0606   CREATININE 0.72 05/24/2012 0606   CALCIUM 8.4 05/24/2012 0606   PROT 6.0 05/23/2012 1415   ALBUMIN 2.3* 05/23/2012 1415   AST 14 05/23/2012 1415   ALT 5 05/23/2012 1415   ALKPHOS 90 05/23/2012 1415   BILITOT 0.2* 05/23/2012 1415   GFRNONAA >90 05/24/2012 0606   GFRAA >90 05/24/2012 0606      Dg Chest 1 View  05/23/2012  *RADIOLOGY REPORT*  Clinical Data: Fall, chest soreness  CHEST - 1 VIEW  Comparison: None.  Findings: Implantable loop recorder projects over the left thorax. Multiple surgical clips are noted in the soft tissues of the left clavicular region.  There is a wall stent projecting over the left clavicular head, likely within the proximal left subclavian or carotid artery. Borderline cardiomegaly.  Mild pulmonary vascular congestion without overt edema.  Appear chronic.  No pneumothorax or large pleural effusion.  Surgical clips in the right upper quadrant suggest prior cholecystectomy.  No acute rib fracture identified.  IMPRESSION:  1.  Borderline cardiomegaly and central bronchitic changes likely reflects chronic processes. 2.  Mild pulmonary vascular congestion. 3.  Implantable loop recorder. 4.  Surgical changes in the left supraclavicular. 5.  Stent in the region of the proximal left common carotid or subclavian artery.   Original Report Authenticated By: Malachy Moan, M.D.    Ct Head Wo Contrast  05/23/2012  *RADIOLOGY REPORT*  Clinical Data:  Fall, confusion.  History of brain tumor.  CT HEAD WITHOUT CONTRAST CT CERVICAL SPINE WITHOUT CONTRAST  Technique:  Multidetector CT imaging of the head and cervical spine was performed following the standard protocol without intravenous contrast.  Multiplanar CT  image reconstructions of the cervical spine were also generated.  Comparison:   None  CT HEAD  Findings: Right frontal craniotomy defect noted. No acute intracranial hemorrhage.  No focal mass lesion.  No CT evidence of acute infarction.   No midline shift or mass effect.  No hydrocephalus.  Basilar cisterns are patent.  There is mild generalized cortical atrophy.  No skull fracture. Paranasal sinuses mastoid air cells are clear.  Left frontal craniotomy defect also noted.  IMPRESSION:  1.  No acute intracranial findings. 2.  Mild generalized atrophy. 3.  Bilateral frontal craniotomy defects  CT CERVICAL SPINE  Findings: No prevertebral soft tissue swelling.  Normal alignment of cervical vertebral bodies.  No loss of vertebral body height. Normal facet articulation.  Normal craniocervical junction.  No evidence epidural or paraspinal hematoma. Vascular stents within the left supraclavicular neck.  IMPRESSION: No evidence cervical spine fracture.   Original Report Authenticated By: Genevive Bi, M.D.    Ct Cervical Spine Wo Contrast  05/23/2012  *RADIOLOGY REPORT*  Clinical Data:  Fall, confusion.  History of brain tumor.  CT HEAD WITHOUT CONTRAST CT CERVICAL SPINE WITHOUT CONTRAST  Technique:  Multidetector CT imaging of the head and cervical spine was performed following the standard protocol without intravenous contrast.  Multiplanar CT image reconstructions of the cervical spine were also generated.  Comparison:   None  CT HEAD  Findings: Right frontal craniotomy defect noted. No acute intracranial hemorrhage.  No focal mass lesion.  No CT evidence of acute infarction.   No midline shift or mass effect.  No hydrocephalus.  Basilar cisterns are patent.  There is mild generalized cortical atrophy.  No skull fracture. Paranasal sinuses mastoid air cells are clear.  Left frontal craniotomy defect also noted.  IMPRESSION:  1.  No acute intracranial findings. 2.  Mild generalized atrophy. 3.  Bilateral frontal  craniotomy defects  CT CERVICAL SPINE  Findings: No prevertebral soft tissue swelling.  Normal alignment of cervical vertebral bodies.  No loss of vertebral body height. Normal facet articulation.  Normal craniocervical junction.  No evidence epidural or paraspinal hematoma. Vascular stents within the left supraclavicular neck.  IMPRESSION: No evidence cervical spine fracture.   Original Report Authenticated By: Genevive Bi, M.D.    Dg Knee Complete 4 Views Left  05/23/2012  *RADIOLOGY REPORT*  Clinical Data: Fall  LEFT KNEE - COMPLETE 4+ VIEW  Comparison: Concurrently obtained radiographs of the right knee  Findings: No acute fracture, malalignment or joint effusion.  No significant degenerative changes.  IMPRESSION: No acute fracture or malalignment.   Original Report Authenticated By: Malachy Moan, M.D.    Dg Knee Complete 4 Views Right  05/23/2012  *RADIOLOGY REPORT*  Clinical Data: Lacerations to the knees secondary to a fall.  RIGHT KNEE - COMPLETE 4+ VIEW  Comparison: None.  Findings: There is no fracture, dislocation, arthritis, joint effusion, or radiodense foreign body in the soft tissues.  IMPRESSION: Normal exam.  Original Report Authenticated By: Francene Boyers, M.D.    Assessment/Plan: Wanda Griffin is a 57 y.o. year old female with a history of brain tumor s/p resection 2013, seizure disorder, anxiety disorder and benzodiazepine abuse presenting with altered mental status   Altered Mental Status - Most likely due to benzo abuse given history and + UDS; possible post-ictal and less likely is metabolic encephalopathy, CVA or recurrent tumor  - Mental status improved today, family agrees at baseline since last night - Seizure precautions  - Continue current dose of Depakote  - Decrease alprazolam dose but give to prevent withdrawal  - Ammonia level normal - CBC and CMP within normal limits (low albumin) - HgB stable - CT: Unchanged from prior  Seizure Disorder - Very poor  history, likely from brain resections, but unknown etiology. - No EEG warranted at this time  - Continue Depakote    Psych - Anxiety disorder  - Cont alprazolam at 0.5 mg BID to prevent withdrawal  - Hold halcion and seroquel    Vascular Disease - S/p stent of left common carotid vs subclavian  - Hold crestor tonight, restart tomorrow   Chronic Constipation - Cont lubiprostone   COPD - Cont symbicort   FENGI - Saline lock  PPX - Heparin SQ   DISPO - Probable discharge today to home

## 2012-05-29 NOTE — Discharge Summary (Signed)
I discussed with  Dr Claiborne Billings.  I agree with their plans documented in their discharge note for today.

## 2014-04-10 DIAGNOSIS — R5382 Chronic fatigue, unspecified: Secondary | ICD-10-CM | POA: Diagnosis not present

## 2014-04-10 DIAGNOSIS — F0390 Unspecified dementia without behavioral disturbance: Secondary | ICD-10-CM | POA: Diagnosis not present

## 2014-04-10 DIAGNOSIS — F419 Anxiety disorder, unspecified: Secondary | ICD-10-CM | POA: Diagnosis not present

## 2014-04-10 DIAGNOSIS — G4700 Insomnia, unspecified: Secondary | ICD-10-CM | POA: Diagnosis not present

## 2014-04-18 DIAGNOSIS — R419 Unspecified symptoms and signs involving cognitive functions and awareness: Secondary | ICD-10-CM | POA: Diagnosis not present

## 2014-05-20 DIAGNOSIS — R419 Unspecified symptoms and signs involving cognitive functions and awareness: Secondary | ICD-10-CM | POA: Diagnosis not present

## 2014-07-02 DIAGNOSIS — R419 Unspecified symptoms and signs involving cognitive functions and awareness: Secondary | ICD-10-CM | POA: Diagnosis not present

## 2014-08-28 DIAGNOSIS — R419 Unspecified symptoms and signs involving cognitive functions and awareness: Secondary | ICD-10-CM | POA: Diagnosis not present

## 2014-10-16 DIAGNOSIS — R419 Unspecified symptoms and signs involving cognitive functions and awareness: Secondary | ICD-10-CM | POA: Diagnosis not present

## 2014-10-31 DIAGNOSIS — I10 Essential (primary) hypertension: Secondary | ICD-10-CM | POA: Diagnosis not present

## 2014-10-31 DIAGNOSIS — R05 Cough: Secondary | ICD-10-CM | POA: Diagnosis not present

## 2014-10-31 DIAGNOSIS — Z79899 Other long term (current) drug therapy: Secondary | ICD-10-CM | POA: Diagnosis not present

## 2014-10-31 DIAGNOSIS — F418 Other specified anxiety disorders: Secondary | ICD-10-CM | POA: Diagnosis not present

## 2014-10-31 DIAGNOSIS — F332 Major depressive disorder, recurrent severe without psychotic features: Secondary | ICD-10-CM | POA: Diagnosis not present

## 2014-10-31 DIAGNOSIS — Z950 Presence of cardiac pacemaker: Secondary | ICD-10-CM | POA: Diagnosis not present

## 2014-10-31 DIAGNOSIS — R45851 Suicidal ideations: Secondary | ICD-10-CM | POA: Diagnosis not present

## 2014-10-31 DIAGNOSIS — F0390 Unspecified dementia without behavioral disturbance: Secondary | ICD-10-CM | POA: Diagnosis not present

## 2014-10-31 DIAGNOSIS — F17210 Nicotine dependence, cigarettes, uncomplicated: Secondary | ICD-10-CM | POA: Diagnosis not present

## 2014-10-31 DIAGNOSIS — J441 Chronic obstructive pulmonary disease with (acute) exacerbation: Secondary | ICD-10-CM | POA: Diagnosis not present

## 2014-11-02 DIAGNOSIS — Z950 Presence of cardiac pacemaker: Secondary | ICD-10-CM | POA: Diagnosis not present

## 2014-11-02 DIAGNOSIS — J441 Chronic obstructive pulmonary disease with (acute) exacerbation: Secondary | ICD-10-CM | POA: Diagnosis not present

## 2014-11-02 DIAGNOSIS — F332 Major depressive disorder, recurrent severe without psychotic features: Secondary | ICD-10-CM | POA: Diagnosis not present

## 2014-11-02 DIAGNOSIS — I10 Essential (primary) hypertension: Secondary | ICD-10-CM | POA: Diagnosis not present

## 2014-11-02 DIAGNOSIS — F0390 Unspecified dementia without behavioral disturbance: Secondary | ICD-10-CM | POA: Diagnosis not present

## 2014-11-02 DIAGNOSIS — F17210 Nicotine dependence, cigarettes, uncomplicated: Secondary | ICD-10-CM | POA: Diagnosis not present

## 2014-11-02 DIAGNOSIS — Z79899 Other long term (current) drug therapy: Secondary | ICD-10-CM | POA: Diagnosis not present

## 2014-12-02 DIAGNOSIS — R419 Unspecified symptoms and signs involving cognitive functions and awareness: Secondary | ICD-10-CM | POA: Diagnosis not present

## 2015-07-27 ENCOUNTER — Encounter (HOSPITAL_COMMUNITY): Payer: Self-pay | Admitting: *Deleted

## 2015-07-27 ENCOUNTER — Inpatient Hospital Stay (HOSPITAL_COMMUNITY): Payer: Medicare Other

## 2015-07-27 ENCOUNTER — Emergency Department (HOSPITAL_COMMUNITY): Payer: Medicare Other

## 2015-07-27 ENCOUNTER — Other Ambulatory Visit: Payer: Self-pay

## 2015-07-27 ENCOUNTER — Inpatient Hospital Stay (HOSPITAL_COMMUNITY)
Admission: EM | Admit: 2015-07-27 | Discharge: 2015-08-04 | DRG: 480 | Disposition: A | Discharge status: Skilled Nursing Facility | Payer: Medicare Other | Attending: Internal Medicine | Admitting: Internal Medicine

## 2015-07-27 DIAGNOSIS — K5909 Other constipation: Secondary | ICD-10-CM | POA: Diagnosis present

## 2015-07-27 DIAGNOSIS — I10 Essential (primary) hypertension: Secondary | ICD-10-CM | POA: Diagnosis present

## 2015-07-27 DIAGNOSIS — S72331A Displaced oblique fracture of shaft of right femur, initial encounter for closed fracture: Secondary | ICD-10-CM | POA: Diagnosis not present

## 2015-07-27 DIAGNOSIS — S72361A Displaced segmental fracture of shaft of right femur, initial encounter for closed fracture: Secondary | ICD-10-CM | POA: Diagnosis not present

## 2015-07-27 DIAGNOSIS — F39 Unspecified mood [affective] disorder: Secondary | ICD-10-CM | POA: Diagnosis not present

## 2015-07-27 DIAGNOSIS — E872 Acidosis: Secondary | ICD-10-CM | POA: Diagnosis present

## 2015-07-27 DIAGNOSIS — D7589 Other specified diseases of blood and blood-forming organs: Secondary | ICD-10-CM | POA: Diagnosis present

## 2015-07-27 DIAGNOSIS — R69 Illness, unspecified: Secondary | ICD-10-CM

## 2015-07-27 DIAGNOSIS — R41841 Cognitive communication deficit: Secondary | ICD-10-CM | POA: Diagnosis not present

## 2015-07-27 DIAGNOSIS — I638 Other cerebral infarction: Secondary | ICD-10-CM | POA: Diagnosis not present

## 2015-07-27 DIAGNOSIS — W19XXXA Unspecified fall, initial encounter: Secondary | ICD-10-CM | POA: Diagnosis not present

## 2015-07-27 DIAGNOSIS — E785 Hyperlipidemia, unspecified: Secondary | ICD-10-CM | POA: Diagnosis not present

## 2015-07-27 DIAGNOSIS — G9340 Encephalopathy, unspecified: Secondary | ICD-10-CM | POA: Diagnosis not present

## 2015-07-27 DIAGNOSIS — Z886 Allergy status to analgesic agent status: Secondary | ICD-10-CM | POA: Diagnosis not present

## 2015-07-27 DIAGNOSIS — Z880 Allergy status to penicillin: Secondary | ICD-10-CM | POA: Diagnosis not present

## 2015-07-27 DIAGNOSIS — W1830XA Fall on same level, unspecified, initial encounter: Secondary | ICD-10-CM | POA: Diagnosis present

## 2015-07-27 DIAGNOSIS — M6281 Muscle weakness (generalized): Secondary | ICD-10-CM | POA: Diagnosis not present

## 2015-07-27 DIAGNOSIS — Z681 Body mass index (BMI) 19 or less, adult: Secondary | ICD-10-CM | POA: Diagnosis not present

## 2015-07-27 DIAGNOSIS — J438 Other emphysema: Secondary | ICD-10-CM | POA: Diagnosis not present

## 2015-07-27 DIAGNOSIS — K589 Irritable bowel syndrome without diarrhea: Secondary | ICD-10-CM | POA: Diagnosis present

## 2015-07-27 DIAGNOSIS — F339 Major depressive disorder, recurrent, unspecified: Secondary | ICD-10-CM | POA: Diagnosis not present

## 2015-07-27 DIAGNOSIS — R4182 Altered mental status, unspecified: Secondary | ICD-10-CM

## 2015-07-27 DIAGNOSIS — G8191 Hemiplegia, unspecified affecting right dominant side: Secondary | ICD-10-CM | POA: Diagnosis not present

## 2015-07-27 DIAGNOSIS — S7291XD Unspecified fracture of right femur, subsequent encounter for closed fracture with routine healing: Secondary | ICD-10-CM | POA: Diagnosis not present

## 2015-07-27 DIAGNOSIS — T148 Other injury of unspecified body region: Secondary | ICD-10-CM | POA: Diagnosis not present

## 2015-07-27 DIAGNOSIS — G40901 Epilepsy, unspecified, not intractable, with status epilepticus: Secondary | ICD-10-CM | POA: Diagnosis present

## 2015-07-27 DIAGNOSIS — J449 Chronic obstructive pulmonary disease, unspecified: Secondary | ICD-10-CM | POA: Diagnosis not present

## 2015-07-27 DIAGNOSIS — Z741 Need for assistance with personal care: Secondary | ICD-10-CM | POA: Diagnosis not present

## 2015-07-27 DIAGNOSIS — F17210 Nicotine dependence, cigarettes, uncomplicated: Secondary | ICD-10-CM | POA: Diagnosis present

## 2015-07-27 DIAGNOSIS — F29 Unspecified psychosis not due to a substance or known physiological condition: Secondary | ICD-10-CM | POA: Diagnosis not present

## 2015-07-27 DIAGNOSIS — I63232 Cerebral infarction due to unspecified occlusion or stenosis of left carotid arteries: Secondary | ICD-10-CM | POA: Diagnosis not present

## 2015-07-27 DIAGNOSIS — I6522 Occlusion and stenosis of left carotid artery: Secondary | ICD-10-CM | POA: Diagnosis present

## 2015-07-27 DIAGNOSIS — S728X9A Other fracture of unspecified femur, initial encounter for closed fracture: Secondary | ICD-10-CM | POA: Diagnosis not present

## 2015-07-27 DIAGNOSIS — S7291XA Unspecified fracture of right femur, initial encounter for closed fracture: Secondary | ICD-10-CM | POA: Diagnosis present

## 2015-07-27 DIAGNOSIS — F0631 Mood disorder due to known physiological condition with depressive features: Secondary | ICD-10-CM | POA: Insufficient documentation

## 2015-07-27 DIAGNOSIS — F418 Other specified anxiety disorders: Secondary | ICD-10-CM | POA: Diagnosis not present

## 2015-07-27 DIAGNOSIS — S72141A Displaced intertrochanteric fracture of right femur, initial encounter for closed fracture: Secondary | ICD-10-CM | POA: Diagnosis not present

## 2015-07-27 DIAGNOSIS — R262 Difficulty in walking, not elsewhere classified: Secondary | ICD-10-CM | POA: Diagnosis not present

## 2015-07-27 DIAGNOSIS — I639 Cerebral infarction, unspecified: Secondary | ICD-10-CM | POA: Diagnosis not present

## 2015-07-27 DIAGNOSIS — N390 Urinary tract infection, site not specified: Secondary | ICD-10-CM | POA: Diagnosis present

## 2015-07-27 DIAGNOSIS — F419 Anxiety disorder, unspecified: Secondary | ICD-10-CM

## 2015-07-27 DIAGNOSIS — R278 Other lack of coordination: Secondary | ICD-10-CM | POA: Diagnosis not present

## 2015-07-27 DIAGNOSIS — S72351A Displaced comminuted fracture of shaft of right femur, initial encounter for closed fracture: Secondary | ICD-10-CM | POA: Diagnosis present

## 2015-07-27 DIAGNOSIS — S7290XA Unspecified fracture of unspecified femur, initial encounter for closed fracture: Secondary | ICD-10-CM | POA: Diagnosis present

## 2015-07-27 DIAGNOSIS — S72001A Fracture of unspecified part of neck of right femur, initial encounter for closed fracture: Secondary | ICD-10-CM | POA: Diagnosis not present

## 2015-07-27 DIAGNOSIS — Z419 Encounter for procedure for purposes other than remedying health state, unspecified: Secondary | ICD-10-CM

## 2015-07-27 DIAGNOSIS — K5900 Constipation, unspecified: Secondary | ICD-10-CM | POA: Diagnosis not present

## 2015-07-27 DIAGNOSIS — Z09 Encounter for follow-up examination after completed treatment for conditions other than malignant neoplasm: Secondary | ICD-10-CM

## 2015-07-27 DIAGNOSIS — E440 Moderate protein-calorie malnutrition: Secondary | ICD-10-CM | POA: Insufficient documentation

## 2015-07-27 DIAGNOSIS — I6789 Other cerebrovascular disease: Secondary | ICD-10-CM | POA: Diagnosis not present

## 2015-07-27 DIAGNOSIS — Z955 Presence of coronary angioplasty implant and graft: Secondary | ICD-10-CM | POA: Diagnosis not present

## 2015-07-27 DIAGNOSIS — S72001D Fracture of unspecified part of neck of right femur, subsequent encounter for closed fracture with routine healing: Secondary | ICD-10-CM | POA: Diagnosis not present

## 2015-07-27 DIAGNOSIS — G40909 Epilepsy, unspecified, not intractable, without status epilepticus: Secondary | ICD-10-CM | POA: Diagnosis present

## 2015-07-27 DIAGNOSIS — Z4789 Encounter for other orthopedic aftercare: Secondary | ICD-10-CM | POA: Diagnosis not present

## 2015-07-27 DIAGNOSIS — S72144A Nondisplaced intertrochanteric fracture of right femur, initial encounter for closed fracture: Secondary | ICD-10-CM | POA: Diagnosis not present

## 2015-07-27 DIAGNOSIS — G9389 Other specified disorders of brain: Secondary | ICD-10-CM | POA: Diagnosis not present

## 2015-07-27 DIAGNOSIS — E46 Unspecified protein-calorie malnutrition: Secondary | ICD-10-CM | POA: Diagnosis not present

## 2015-07-27 DIAGNOSIS — Z8543 Personal history of malignant neoplasm of ovary: Secondary | ICD-10-CM

## 2015-07-27 DIAGNOSIS — F329 Major depressive disorder, single episode, unspecified: Secondary | ICD-10-CM | POA: Diagnosis present

## 2015-07-27 DIAGNOSIS — Z945 Skin transplant status: Secondary | ICD-10-CM | POA: Diagnosis not present

## 2015-07-27 DIAGNOSIS — S72301A Unspecified fracture of shaft of right femur, initial encounter for closed fracture: Secondary | ICD-10-CM | POA: Diagnosis not present

## 2015-07-27 DIAGNOSIS — F32A Depression, unspecified: Secondary | ICD-10-CM | POA: Diagnosis present

## 2015-07-27 DIAGNOSIS — Z720 Tobacco use: Secondary | ICD-10-CM | POA: Diagnosis present

## 2015-07-27 DIAGNOSIS — M79604 Pain in right leg: Secondary | ICD-10-CM | POA: Diagnosis not present

## 2015-07-27 DIAGNOSIS — I63032 Cerebral infarction due to thrombosis of left carotid artery: Secondary | ICD-10-CM | POA: Diagnosis not present

## 2015-07-27 DIAGNOSIS — R569 Unspecified convulsions: Secondary | ICD-10-CM | POA: Diagnosis not present

## 2015-07-27 HISTORY — DX: Unspecified convulsions: R56.9

## 2015-07-27 HISTORY — DX: Major depressive disorder, single episode, unspecified: F32.9

## 2015-07-27 HISTORY — DX: Depression, unspecified: F32.A

## 2015-07-27 HISTORY — DX: Anxiety disorder, unspecified: F41.9

## 2015-07-27 LAB — COMPREHENSIVE METABOLIC PANEL
ALK PHOS: 78 U/L (ref 38–126)
ALT: 12 U/L — AB (ref 14–54)
AST: 18 U/L (ref 15–41)
Albumin: 3.4 g/dL — ABNORMAL LOW (ref 3.5–5.0)
Anion gap: 6 (ref 5–15)
BUN: 7 mg/dL (ref 6–20)
CALCIUM: 9.1 mg/dL (ref 8.9–10.3)
CHLORIDE: 110 mmol/L (ref 101–111)
CO2: 23 mmol/L (ref 22–32)
CREATININE: 0.78 mg/dL (ref 0.44–1.00)
GFR calc non Af Amer: >60 mL/min (ref >60)
GLUCOSE: 117 mg/dL — AB (ref 65–99)
Potassium: 3.9 mmol/L (ref 3.5–5.1)
SODIUM: 139 mmol/L (ref 135–145)
Total Bilirubin: 0.1 mg/dL — ABNORMAL LOW (ref 0.3–1.2)
Total Protein: 6.2 g/dL — ABNORMAL LOW (ref 6.5–8.1)

## 2015-07-27 LAB — TYPE AND SCREEN
ABO/RH(D): A POS
Antibody Screen: NEGATIVE

## 2015-07-27 LAB — CBC WITH DIFFERENTIAL/PLATELET
BASOS ABS: 0 10*3/uL (ref 0.0–0.1)
Basophils Relative: 0 %
EOS ABS: 0.1 10*3/uL (ref 0.0–0.7)
Eosinophils Relative: 1 %
HCT: 40.2 % (ref 36.0–46.0)
HEMOGLOBIN: 12.4 g/dL (ref 12.0–15.0)
Lymphocytes Relative: 24 %
Lymphs Abs: 1.7 10*3/uL (ref 0.7–4.0)
MCH: 32.5 pg (ref 26.0–34.0)
MCHC: 30.8 g/dL (ref 30.0–36.0)
MCV: 105.2 fL — ABNORMAL HIGH (ref 78.0–100.0)
MONO ABS: 0.4 10*3/uL (ref 0.1–1.0)
MONOS PCT: 6 %
NEUTROS ABS: 5 10*3/uL (ref 1.7–7.7)
Neutrophils Relative %: 69 %
PLATELETS: 255 10*3/uL (ref 150–400)
RBC: 3.82 MIL/uL — AB (ref 3.87–5.11)
RDW: 13.6 % (ref 11.5–15.5)
WBC: 7.2 10*3/uL (ref 4.0–10.5)

## 2015-07-27 LAB — I-STAT CHEM 8, ED
BUN: 8 mg/dL (ref 6–20)
CALCIUM ION: 1.15 mmol/L (ref 1.12–1.23)
CREATININE: 0.8 mg/dL (ref 0.44–1.00)
Chloride: 108 mmol/L (ref 101–111)
GLUCOSE: 114 mg/dL — AB (ref 65–99)
HEMATOCRIT: 43 % (ref 36.0–46.0)
HEMOGLOBIN: 14.6 g/dL (ref 12.0–15.0)
Potassium: 3.8 mmol/L (ref 3.5–5.1)
Sodium: 141 mmol/L (ref 135–145)
TCO2: 23 mmol/L (ref 0–100)

## 2015-07-27 LAB — ABO/RH: ABO/RH(D): A POS

## 2015-07-27 LAB — PROTIME-INR
INR: 0.98 (ref 0.00–1.49)
PROTHROMBIN TIME: 13.2 s (ref 11.6–15.2)

## 2015-07-27 MED ORDER — ZOLPIDEM TARTRATE 5 MG PO TABS: 5.0000 mg | ORAL_TABLET | Freq: Every evening | ORAL | Status: DC | PRN

## 2015-07-27 MED ORDER — FENTANYL CITRATE (PF) 100 MCG/2ML IJ SOLN
100.0000 ug | Freq: Once | INTRAMUSCULAR | Status: AC
  Administered 2015-07-27: 100 ug via INTRAVENOUS
  Filled 2015-07-27: qty 2

## 2015-07-27 MED ORDER — CHLORHEXIDINE GLUCONATE 4 % EX LIQD
60.0000 mL | Freq: Once | CUTANEOUS | Status: AC
  Administered 2015-07-28: 4 via TOPICAL
  Filled 2015-07-27: qty 60

## 2015-07-27 MED ORDER — LUBIPROSTONE 24 MCG PO CAPS
24.0000 ug | ORAL_CAPSULE | Freq: Two times a day (BID) | ORAL | Status: DC
  Administered 2015-07-29 – 2015-08-04 (×14): 24 ug via ORAL
  Filled 2015-07-27 (×17): qty 1

## 2015-07-27 MED ORDER — MORPHINE SULFATE (PF) 2 MG/ML IV SOLN
0.5000 mg | INTRAVENOUS | Status: DC | PRN
  Administered 2015-07-27 – 2015-07-28 (×3): 0.5 mg via INTRAVENOUS
  Filled 2015-07-27 (×3): qty 1

## 2015-07-27 MED ORDER — POLYETHYLENE GLYCOL 3350 17 G PO PACK: 17.0000 g | PACK | Freq: Every day | ORAL | Status: DC | PRN

## 2015-07-27 MED ORDER — BISACODYL 5 MG PO TBEC: 5.0000 mg | DELAYED_RELEASE_TABLET | Freq: Every day | ORAL | Status: DC | PRN

## 2015-07-27 MED ORDER — HYDROMORPHONE HCL 1 MG/ML IJ SOLN
0.5000 mg | Freq: Once | INTRAMUSCULAR | Status: AC
  Administered 2015-07-27: 0.5 mg via INTRAVENOUS

## 2015-07-27 MED ORDER — POVIDONE-IODINE 10 % EX SWAB
2.0000 | Freq: Once | CUTANEOUS | Status: AC
Start: 2015-07-28 — End: 2015-07-28
  Administered 2015-07-28: 2 via TOPICAL

## 2015-07-27 MED ORDER — METHOCARBAMOL 500 MG PO TABS
500.0000 mg | ORAL_TABLET | Freq: Four times a day (QID) | ORAL | Status: DC | PRN
  Administered 2015-07-28: 500 mg via ORAL
  Filled 2015-07-27: qty 1

## 2015-07-27 MED ORDER — HYDROMORPHONE HCL 1 MG/ML IJ SOLN
INTRAMUSCULAR | Status: AC
  Filled 2015-07-27: qty 1

## 2015-07-27 MED ORDER — DIVALPROEX SODIUM 500 MG PO DR TAB
500.0000 mg | DELAYED_RELEASE_TABLET | Freq: Three times a day (TID) | ORAL | Status: DC
  Administered 2015-07-27 – 2015-08-04 (×23): 500 mg via ORAL
  Filled 2015-07-27 (×2): qty 1
  Filled 2015-07-27: qty 2
  Filled 2015-07-27 (×16): qty 1
  Filled 2015-07-27: qty 2
  Filled 2015-07-27 (×4): qty 1

## 2015-07-27 MED ORDER — HYDROMORPHONE HCL 1 MG/ML IJ SOLN
0.5000 mg | Freq: Once | INTRAMUSCULAR | Status: AC
  Administered 2015-07-27: 0.5 mg via INTRAVENOUS
  Filled 2015-07-27: qty 1

## 2015-07-27 MED ORDER — MORPHINE SULFATE (PF) 4 MG/ML IV SOLN
4.0000 mg | Freq: Once | INTRAVENOUS | Status: AC
  Administered 2015-07-27: 4 mg via INTRAVENOUS
  Filled 2015-07-27: qty 1

## 2015-07-27 MED ORDER — LIDOCAINE HCL (PF) 1 % IJ SOLN
10.0000 mL | Freq: Once | INTRAMUSCULAR | Status: AC
  Administered 2015-07-27: 10 mL
  Filled 2015-07-27: qty 10

## 2015-07-27 MED ORDER — ROSUVASTATIN CALCIUM 10 MG PO TABS
20.0000 mg | ORAL_TABLET | Freq: Every day | ORAL | Status: DC
  Administered 2015-07-29 – 2015-08-04 (×7): 20 mg via ORAL
  Filled 2015-07-27: qty 2
  Filled 2015-07-27: qty 1
  Filled 2015-07-27 (×7): qty 2

## 2015-07-27 MED ORDER — QUETIAPINE FUMARATE 25 MG PO TABS
25.0000 mg | ORAL_TABLET | Freq: Every day | ORAL | Status: DC
  Administered 2015-07-27 – 2015-07-31 (×5): 25 mg via ORAL
  Filled 2015-07-27 (×5): qty 1

## 2015-07-27 MED ORDER — ALPRAZOLAM 0.5 MG PO TABS
1.0000 mg | ORAL_TABLET | Freq: Two times a day (BID) | ORAL | Status: DC | PRN
  Administered 2015-07-29 – 2015-08-03 (×2): 1 mg via ORAL
  Filled 2015-07-27 (×3): qty 2

## 2015-07-27 MED ORDER — IPRATROPIUM-ALBUTEROL 0.5-2.5 (3) MG/3ML IN SOLN
3.0000 mL | RESPIRATORY_TRACT | Status: DC | PRN
Start: 2015-07-27 — End: 2015-08-04

## 2015-07-27 MED ORDER — HYDROMORPHONE HCL 1 MG/ML IJ SOLN
1.0000 mg | Freq: Once | INTRAMUSCULAR | Status: AC
  Administered 2015-07-27: 1 mg via INTRAVENOUS

## 2015-07-27 MED ORDER — MOMETASONE FURO-FORMOTEROL FUM 200-5 MCG/ACT IN AERO
2.0000 | INHALATION_SPRAY | Freq: Two times a day (BID) | RESPIRATORY_TRACT | Status: DC
  Administered 2015-08-03 – 2015-08-04 (×2): 2 via RESPIRATORY_TRACT
  Filled 2015-07-27 (×2): qty 8.8

## 2015-07-27 MED ORDER — HYDROCODONE-ACETAMINOPHEN 5-325 MG PO TABS: 1.0000 | ORAL_TABLET | Freq: Four times a day (QID) | ORAL | Status: DC | PRN

## 2015-07-27 MED ORDER — HYDROMORPHONE HCL 1 MG/ML IJ SOLN
1.0000 mg | Freq: Once | INTRAMUSCULAR | Status: DC
Start: 2015-07-27 — End: 2015-07-27

## 2015-07-27 MED ORDER — METHOCARBAMOL 1000 MG/10ML IJ SOLN
500.0000 mg | Freq: Four times a day (QID) | INTRAVENOUS | Status: DC | PRN
  Filled 2015-07-27: qty 5

## 2015-07-27 MED ORDER — POTASSIUM CHLORIDE IN NACL 20-0.9 MEQ/L-% IV SOLN
INTRAVENOUS | Status: AC
  Administered 2015-07-28: 75 mL/h via INTRAVENOUS
  Filled 2015-07-27: qty 1000

## 2015-07-27 MED ORDER — FENTANYL CITRATE (PF) 100 MCG/2ML IJ SOLN
50.0000 ug | INTRAMUSCULAR | Status: DC | PRN
  Administered 2015-07-27: 50 ug via INTRAVENOUS
  Filled 2015-07-27: qty 2

## 2015-07-27 MED ORDER — CLINDAMYCIN PHOSPHATE 900 MG/50ML IV SOLN
900.0000 mg | INTRAVENOUS | Status: AC
  Administered 2015-07-28: 900 mg via INTRAVENOUS
  Filled 2015-07-27 (×2): qty 50

## 2015-07-27 MED ORDER — NICOTINE 14 MG/24HR TD PT24: 14.0000 mg | MEDICATED_PATCH | Freq: Every day | TRANSDERMAL | Status: DC | PRN

## 2015-07-27 NOTE — ED Notes (Signed)
Offered fluids and snack to patient, states she does not feel like eating or drinking anything at this time.

## 2015-07-27 NOTE — H&P (Signed)
History and Physical    Wanda Griffin [id number] DOB: [birth year] DOA: 07/27/2015  PCP: [name], MD   Patient coming from: Home   Chief Complaint: Right leg pain   HPI: Wanda Griffin is a 60 y.o. 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 Patient reports that she had been in her usual state of health with no fever, chills, chest pain, palpitations, or dyspnea when she was squatting and pivoting to get into her car this evening. She heard a "pop" from the right thigh and experienced immediate pain. EMS was activated for transported to the hospital. She reportedly had a shortening and external rotation of the right lower extremity and was placed in traction by EMS. 200 g of fentanyl was administered en route.  ED Course: Upon arrival to the ED, patient is found to be afebrile, saturating well on room air, mildly hypertensive, but with vitals otherwise stable. EKG demonstrates a sinus rhythm with short PR and RSR'in V1 with no significant change relative to prior EKGs. Chest x-ray is negative for acute cardiopulmonary disease and radiographs of the right femur demonstrate no bleak, mild comminuted and displaced fracture of the proximal shaft. CMP is largely unremarkable and CBC is notable only for MCV of 105.2. INR is 2.98 and type and screen has been performed. Additional fentanyl was administered in the emergency department, patient remained hemodynamically stable, and orthopedic surgery was consulted by the ED physician. Admission to the hospitalist service was requested with plans for operative management of the right femur fracture.  Review of Systems:  All other systems  reviewed and apart from HPI, are negative.  Past Medical History  Diagnosis Date  . Subdural hematoma ([place]) 2012    After tumor resection  . Brain tumor ([place]) 2012    Resected, had subsequent subdural hematoma  . Mental health disorder   . IBS (irritable bowel syndrome)     chronic constipation  . COPD (chronic obstructive pulmonary disease) ([facility])     FEV1 36% predicted  . 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 Tobacco abuse   . Benzodiazepine abuse   . Altered mental status     /notes "has episodes ~ q month/family" (05/23/2012)    Past Surgical History  Procedure Laterality Date  . Brain surgery  01/2012    Tumor resection  . Loop recorder implant    . Subdural hematoma evacuation via craniotomy Left 2009  . Subdural hematoma evacuation via craniotomy Right 2014  . Tubal ligation    . Vaginal hysterectomy    . Dilation and curettage of uterus    . Cardiac catheterization    . Coronary angioplasty with stent placement  ~ 2009    "1" (05/23/2012)     reports that she has been smoking Cigarettes.  She has a 10 pack-year smoking history. She does not have any smokeless tobacco history on file. She reports that she uses illicit drugs (Marijuana). She reports that she does not drink alcohol.  Allergies  Allergen Reactions  . Ampicillin Hives  . Asa [Aspirin] Hives  . Colchicine     unknown  . Toradol [Ketorolac Tromethamine] Hives    History reviewed.  No pertinent family history.   Prior to Admission medications   Medication Sig Start Date End Date Taking? Authorizing Provider  acetaminophen (TYLENOL) 500 MG tablet Take 500 mg by mouth every 6 (six) hours as needed for mild pain.   Yes Historical Provider, MD  ALPRAZolam Duanne Moron) 1 MG tablet Take 1 mg by mouth 2 (two) times daily as needed for anxiety.     Historical Provider, MD  budesonide-formoterol (SYMBICORT) 160-4.5 MCG/ACT inhaler Inhale 2 puffs into the lungs 2 (two) times daily.     Historical Provider, MD  divalproex (DEPAKOTE) 500 MG DR tablet Take 500 mg by mouth 3 (three) times daily.    Historical Provider, MD  lubiprostone (AMITIZA) 24 MCG capsule Take 24 mcg by mouth 2 (two) times daily with a meal.    Historical Provider, MD  QUEtiapine (SEROQUEL) 25 MG tablet Take 25 mg by mouth at bedtime.    Historical Provider, MD  rosuvastatin (CRESTOR) 20 MG tablet Take 20 mg by mouth daily.    Historical Provider, MD    Physical Exam: Filed Vitals:   07/27/15 1618 07/27/15 1627 07/27/15 1730 07/27/15 1845  BP:  172/80 164/63 158/69  Pulse:  80 82 87  Temp:  98.6 F (37 C)    TempSrc:  Oral    Resp:  20  16  SpO2: 98% 99% 99% 96%      Constitutional: NAD, calm, in apparent discomfort, very thin  Eyes: PERTLA, lids and conjunctivae normal ENMT: Mucous membranes are moist. Posterior pharynx clear of any exudate or lesions.   Neck: normal, supple, no masses, no thyromegaly Respiratory: clear to auscultation bilaterally, no wheezing, no crackles. Normal respiratory effort.   Cardiovascular: S1 & S2 heard, regular rate and rhythm. No extremity edema. No significant JVD. Abdomen: No distension, no tenderness, no masses palpated. Bowel sounds normal.  Musculoskeletal: no clubbing / cyanosis. Deformity at mid-proximal right thigh, neurovascularly intact distally. Normal muscle tone.  Skin: no significant rashes, lesions, ulcers. Warm, dry, well-perfused. Neurologic: CN 2-12 grossly intact. Sensation intact, DTR normal. Strength 5/5 in all 4 limbs.  Psychiatric: Normal judgment and insight. Alert and oriented x 3. Normal mood and affect.     Labs on Admission: I have personally reviewed following labs and imaging studies  CBC:  Recent Labs Lab 07/27/15 1712 07/27/15 1723  WBC 7.2  --   NEUTROABS 5.0  --   HGB 12.4 14.6  HCT 40.2 43.0  MCV 105.2*  --   PLT 255  --    Basic Metabolic Panel:  Recent Labs Lab 07/27/15 1712 07/27/15 1723  NA 139 141  K  3.9 3.8  CL 110 108  CO2 23  --   GLUCOSE 117* 114*  BUN 7 8  CREATININE 0.78 0.80  CALCIUM 9.1  --    GFR: CrCl cannot be calculated (Unknown ideal weight.). Liver Function Tests:  Recent Labs Lab 07/27/15 1712  AST 18  ALT 12*  ALKPHOS 78  BILITOT 0.1*  PROT 6.2*  ALBUMIN 3.4*   No results for input(s): LIPASE, AMYLASE in the last 168 hours. No results for input(s): AMMONIA in the last 168 hours. Coagulation Profile:  Recent Labs Lab 07/27/15 1712  INR 0.98   Cardiac Enzymes: No results for input(s): CKTOTAL, CKMB, CKMBINDEX, TROPONINI in the last 168 hours. BNP (last 3 results) No results for input(s): PROBNP in the last 8760 hours. HbA1C: No results for input(s): HGBA1C in the last 72 hours. CBG: No results for input(s):  GLUCAP in the last 168 hours. Lipid Profile: No results for input(s): CHOL, HDL, LDLCALC, TRIG, CHOLHDL, LDLDIRECT in the last 72 hours. Thyroid Function Tests: No results for input(s): TSH, T4TOTAL, FREET4, T3FREE, THYROIDAB in the last 72 hours. Anemia Panel: No results for input(s): VITAMINB12, FOLATE, FERRITIN, TIBC, IRON, RETICCTPCT in the last 72 hours. Urine analysis:    Component Value Date/Time   COLORURINE YELLOW 05/23/2012 [place] 05/23/2012 1359   LABSPEC 1.021 05/23/2012 1359   PHURINE 5.0 05/23/2012 1359   GLUCOSEU NEGATIVE 05/23/2012 1359   HGBUR NEGATIVE 05/23/2012 1359   BILIRUBINUR NEGATIVE 05/23/2012 1359   KETONESUR 15* 05/23/2012 1359   PROTEINUR NEGATIVE 05/23/2012 1359   UROBILINOGEN 1.0 05/23/2012 1359   NITRITE NEGATIVE 05/23/2012 1359   LEUKOCYTESUR NEGATIVE 05/23/2012 1359   Sepsis Labs: @LABRCNTIP (procalcitonin:4,lacticidven:4) )No results found for this or any previous visit (from the past 240 hour(s)).   Radiological Exams on Admission: Dg Chest Portable 1 View  07/27/2015  CLINICAL DATA:  No chest complaints. Hx of COPD. Pt is a current smoker. Pt c/o proximal right femur pain  after stepping into her car and hearing her leg pop. No hx of prior injuries. Best obtainable images due to pt condition. EXAM: PORTABLE CHEST 1 VIEW COMPARISON:  05/23/2012 FINDINGS: Normal cardiac silhouette. Vascular stent again noted extending from the aortic arch. Cardiac recording device noted. Normal mediastinum and cardiac silhouette. Normal pulmonary vasculature. No evidence of effusion, infiltrate, or pneumothorax. No acute bony abnormality. IMPRESSION: No acute cardiopulmonary process. Electronically Signed   By: Suzy Bouchard M.D.   On: 07/27/2015 17:23   Dg Femur Port, Min 2 Views Right  07/27/2015  CLINICAL DATA:  Right femur pain after stepping into her car today and hearing a pop EXAM: RIGHT FEMUR PORTABLE 1 VIEW COMPARISON:  None. FINDINGS: Four views of the right femur submitted. There is oblique mild comminuted displaced fracture in proximal shaft of the right femur. There is at least 4 cm separation of bony fragments. No hip dislocation. IMPRESSION: Oblique mild comminuted displaced fracture proximal shaft of the right femur. Electronically Signed   By: Lahoma Crocker M.D.   On: 07/27/2015 17:25    EKG: Independently reviewed. Sinus rhythm, short PR, RSR' in V1, no significant change from prior  Assessment/Plan  1. Proximal femur fracture, right  - Occurred with squatting and pivoting to get into car just PTA  - Radiographs with oblique, mildly comminuted, displaced fx of prox shaft  - Dr. Rolena Infante of orthopedic surgery on the case with tentative plans for operative management tomorrow  - Traction pin placed in ED  - CXR with no acute cardiopulmonary dz; EKG without arrhythmia or significant changes from priors; type and screen done; chem panel and CBC okay; UA remains pending  - Pt presents a low perioperative cardiac risk per revised cardiac risk index - Keep NPO after MN; DVT ppx per surgical team  - Pain-control prn   2. Anxiety and depression  - Anxiety exacerbated by  current situation; depression reportedly stable  - Continue current management with Seroquel and prn Xanax   3. Seizure disorder  - Likely secondary to remote brain tumor s/p resection  - Pt reports no recent seizure  - Continue current management with Depakote    4. COPD  - Stable, no evidence of acute exacerbation  - Continue ICS/LABA with Dulera while in hospital  - DuoNeb q4h prn SOB or wheezing    5. Hyperlipidemia  - Continue Crestor  6. Tobacco abuse  - Counseled toward cessation  - Nicotine patch available  - RN asked to provide smoking cessation information prior to discharge   7. Macrocytosis  - MCV 105.2 without anemia  - Check B12 and folate; pt denies EtOH use     DVT prophylaxis: Left SCD, post-op ppx per surgical team  Code Status: Full  Family Communication: Husband updated at bedside   Disposition Plan: Admit to med-surg  Consults called: Orthopedic surgery  Admission status: Inpatient    [name], MD [facility] Pager [phone]  If 7PM-7AM, please contact night-coverage [email or web address] Password [facility]  07/27/2015, 7:14 PM

## 2015-07-27 NOTE — ED Notes (Signed)
Reports provided to Amy RN on 5N.  ED MD does not wish for pt to be transported upstairs until ortho comes down to place pt in traction.

## 2015-07-27 NOTE — Progress Notes (Signed)
Orthopedic Tech Progress Note Patient Details:  Wanda Griffin 1955/06/14 [id number]  Musculoskeletal Traction Type of Traction: Skeletal (Balanced Suspension) Traction Location: RLE Traction Weight: 15 lbs    Braulio Bosch 07/27/2015, 10:06 PM

## 2015-07-27 NOTE — ED Notes (Signed)
Warm washcloth placed over infiltration site.

## 2015-07-27 NOTE — ED Notes (Signed)
Per EMS: pt coming from home with c/o fall at home getting into car. Pt felt a pop in her right thigh. Pt has shortening and external rotation. Traction placed by EMS. Pt given 200 mcg fentanyl with no relief. Pt denies hitting her head, denies neck/back pain, denies LOC. CMS intact, strong pulses.

## 2015-07-27 NOTE — ED Notes (Signed)
Dr. Rolena Infante successfully drilled screw into patient's leg.

## 2015-07-27 NOTE — ED Notes (Signed)
MD at bedside. 

## 2015-07-27 NOTE — Consult Note (Signed)
Patient ID: KAMIRAH HAEFELE MRN: [id number] DOB/AGE: July 18, 1955 60 y.o.  Admit date: 07/27/2015  Admission Diagnoses:  Principal Problem:   Femur fracture ([place]) Active Problems:   Seizure disorder (HCC)   Anxiety and depression   Tobacco abuse   Macrocytosis without anemia   Hyperlipidemia   Chronic constipation   COPD (chronic obstructive pulmonary disease) (HCC)   Femur fracture, right (HCC)   HPI: Pleasant 60 year old female pt with a hx of a brain tumor, subdural hematoma, COPD and ovarian cancer.  She reports when she got in the car today as she twisted and fell to the ground.  She reports landing on her right leg and feeling a snap in her right femur.  She reports severe pain and being unable to bear weight after the trama.   Past Medical History: Past Medical History  Diagnosis Date  . Subdural hematoma ([place]) 2012    After tumor resection  . Brain tumor ([place]) 2012    Resected, had subsequent subdural hematoma  . Mental health disorder   . IBS (irritable bowel syndrome)     chronic constipation  . COPD (chronic obstructive pulmonary disease) (HCC)     FEV1 36% predicted  . 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 Tobacco abuse   . Benzodiazepine abuse   . Altered mental status     /notes "has episodes ~ q month/family" (05/23/2012)    Surgical History: Past Surgical History  Procedure Laterality Date  . Brain surgery  01/2012    Tumor resection  . Loop recorder implant    . Subdural hematoma evacuation via craniotomy Left 2009  . Subdural hematoma evacuation via craniotomy Right 2014  . Tubal ligation    . Vaginal hysterectomy    . Dilation and curettage of uterus    . Cardiac catheterization    . Coronary angioplasty with stent placement  ~ 2009    "1" (05/23/2012)    Family History: History reviewed. No pertinent family history.  Social History: Social History   Social History  . Marital Status: Married   Spouse Name: N/A  . Number of Children: N/A  . Years of Education: N/A   Occupational History  . Not on file.   Social History Main Topics  . Smoking status: Current Every Day Smoker -- 0.50 packs/day for 20 years    Types: Cigarettes  . Smokeless tobacco: Not on file     Comment: 05/23/2012 offered smoking cessation materials; pt declines  . Alcohol Use: No  . Drug Use: Yes    Special: Marijuana     Comment: 05/23/2012 "last marijuana ~ 3 wk ago; usually smoke once q 2-3 months"  . Sexual Activity: Yes   Other Topics Concern  . Not on file   Social History Narrative   Lives with husband in [place], [place]. All care is received from [facility].     Allergies: Ampicillin; Asa; Colchicine; and Toradol  Medications: I have reviewed the patient's current medications.  Vital Signs: Patient Vitals for the past 24 hrs:  BP Temp Temp src Pulse Resp SpO2  07/27/15 1915 135/63 mmHg - - 89 17 95 %  07/27/15 1845 158/69 mmHg - - 87 16 96 %  07/27/15 1730 164/63 mmHg - - 82 - 99 %  07/27/15 1627 172/80 mmHg 98.6 F (37 C) Oral 80 20 99 %  07/27/15 1618 - - - - - 98 %  Radiology: Dg Chest Portable 1 View  07/27/2015  CLINICAL DATA:  No chest complaints. Hx of COPD. Pt is a current smoker. Pt c/o proximal right femur pain after stepping into her car and hearing her leg pop. No hx of prior injuries. Best obtainable images due to pt condition. EXAM: PORTABLE CHEST 1 VIEW COMPARISON:  05/23/2012 FINDINGS: Normal cardiac silhouette. Vascular stent again noted extending from the aortic arch. Cardiac recording device noted. Normal mediastinum and cardiac silhouette. Normal pulmonary vasculature. No evidence of effusion, infiltrate, or pneumothorax. No acute bony abnormality. IMPRESSION: No acute cardiopulmonary process. Electronically Signed   By: Suzy Bouchard M.D.   On: 07/27/2015 17:23   Dg Femur Port, Min 2 Views Right  07/27/2015  CLINICAL DATA:  Right femur pain after stepping  into her car today and hearing a pop EXAM: RIGHT FEMUR PORTABLE 1 VIEW COMPARISON:  None. FINDINGS: Four views of the right femur submitted. There is oblique mild comminuted displaced fracture in proximal shaft of the right femur. There is at least 4 cm separation of bony fragments. No hip dislocation. IMPRESSION: Oblique mild comminuted displaced fracture proximal shaft of the right femur. Electronically Signed   By: Lahoma Crocker M.D.   On: 07/27/2015 17:25    Labs:  Recent Labs  07/27/15 1712 07/27/15 1723  WBC 7.2  --   RBC 3.82*  --   HCT 40.2 43.0  PLT 255  --     Recent Labs  07/27/15 1712 07/27/15 1723  NA 139 141  K 3.9 3.8  CL 110 108  CO2 23  --   BUN 7 8  CREATININE 0.78 0.80  GLUCOSE 117* 114*  CALCIUM 9.1  --     Recent Labs  07/27/15 1712  INR 0.98    Review of Systems: ROS  Physical Exam: Neurologically intact ABD soft Sensation intact distally TTP of the right upper LE Decreased ROM of RLE Deformity noted of the right femur  Assessment and Plan: Traction pin will be put in while in ED NPO after midnight Surgical intervention tomorrows Hospitalist consulted to admit  Melina Schools, MD [place] [phone]  Agree with above Closed injury  Will require ORIF Discussed with Dr Lyla Glassing Proximal tibial traction pin placed in ER Consent obtained  Post-pin xrays pending

## 2015-07-27 NOTE — ED Provider Notes (Signed)
CSN: [id number]     Arrival date & time 07/27/15  1617 History   First MD Initiated Contact with Patient 07/27/15 1628     Chief Complaint  Patient presents with  . Fall  . Leg Pain     (Consider location/radiation/quality/duration/timing/severity/associated sxs/prior Treatment) HPI  The patient is a 60 year old female, she does have a history of a prior brain tumor, subsequent subdural hematoma, COPD, she also has a history of prior ovarian cancer many years ago which she underwent resection but never had chemotherapy or radiation. She states that while she was tried to get into the car today as she bent over she twisted and felt a snap in her mid right femur causing her to collapse and she was nonambulatory after that. Her pain is persistent severe and worse with movement. She was placed in traction by EMS, her last meal was 2 hours ago when she had a bologna and cheese sandwich  Past Medical History  Diagnosis Date  . Subdural hematoma ([place]) 2012    After tumor resection  . Brain tumor ([place]) 2012    Resected, had subsequent subdural hematoma  . Mental health disorder   . IBS (irritable bowel syndrome)     chronic constipation  . COPD (chronic obstructive pulmonary disease) (HCC)     FEV1 36% predicted  . 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 Tobacco abuse   . Benzodiazepine abuse   . Altered mental status     /notes "has episodes ~ q month/family" (05/23/2012)   Past Surgical History  Procedure Laterality Date  . Brain surgery  01/2012    Tumor resection  . Loop recorder implant    . Subdural hematoma evacuation via craniotomy Left 2009  . Subdural hematoma evacuation via craniotomy Right 2014  . Tubal ligation    . Vaginal hysterectomy    . Dilation and curettage of uterus    . Cardiac catheterization    . Coronary angioplasty with stent placement  ~ 2009    "1" (05/23/2012)   History reviewed. No pertinent family history. Social History   Substance Use Topics  . Smoking status: Current Every Day Smoker -- 0.50 packs/day for 20 years    Types: Cigarettes  . Smokeless tobacco: None     Comment: 05/23/2012 offered smoking cessation materials; pt declines  . Alcohol Use: No   OB History    No data available     Review of Systems  All other systems reviewed and are negative.     Allergies  Ampicillin; Asa; Colchicine; and Toradol  Home Medications   Prior to Admission medications   Medication Sig Start Date End Date Taking? Authorizing Provider  ALPRAZolam Duanne Moron) 1 MG tablet Take 1 mg by mouth 2 (two) times daily.    Historical Provider, MD  budesonide-formoterol (SYMBICORT) 160-4.5 MCG/ACT inhaler Inhale 2 puffs into the lungs 2 (two) times daily.    Historical Provider, MD  divalproex (DEPAKOTE) 500 MG DR tablet Take 500 mg by mouth 3 (three) times daily.    Historical Provider, MD  lubiprostone (AMITIZA) 24 MCG capsule Take 24 mcg by mouth 2 (two) times daily with a meal.    Historical Provider, MD  QUEtiapine (SEROQUEL) 25 MG tablet Take 25 mg by mouth at bedtime.    Historical Provider, MD  rosuvastatin (CRESTOR) 20 MG tablet Take 20 mg by mouth daily.    Historical Provider, MD   BP 164/63 mmHg  Pulse  82  Temp(Src) 98.6 F (37 C) (Oral)  Resp 20  SpO2 99% Physical Exam  Constitutional: She appears well-developed and well-nourished. No distress.  HENT:  Head: Normocephalic and atraumatic.  Mouth/Throat: Oropharynx is clear and moist. No oropharyngeal exudate.  Eyes: Conjunctivae and EOM are normal. Pupils are equal, round, and reactive to light. Right eye exhibits no discharge. Left eye exhibits no discharge. No scleral icterus.  Neck: Normal range of motion. Neck supple. No JVD present. No thyromegaly present.  Cardiovascular: Normal rate, regular rhythm, normal heart sounds and intact distal pulses.  Exam reveals no gallop and no friction rub.   No murmur heard. Pulmonary/Chest: Effort normal. No  respiratory distress. She has wheezes. She has no rales.  Abdominal: Soft. Bowel sounds are normal. She exhibits no distension and no mass. There is no tenderness.  Musculoskeletal: She exhibits tenderness. She exhibits no edema.  Dec ROM to the hip and knee - mid femur with deformity - intraction - pulses at foot normal  Lymphadenopathy:    She has no cervical adenopathy.  Neurological: She is alert. Coordination normal.  Skin: Skin is warm and dry. No rash noted. No erythema.  Psychiatric: She has a normal mood and affect. Her behavior is normal.  Nursing note and vitals reviewed.   ED Course  Procedures (including critical care time) Labs Review Labs Reviewed  CBC WITH DIFFERENTIAL/PLATELET - Abnormal; Notable for the following:    RBC 3.82 (*)    MCV 105.2 (*)    All other components within normal limits  COMPREHENSIVE METABOLIC PANEL - Abnormal; Notable for the following:    Glucose, Bld 117 (*)    Total Protein 6.2 (*)    Albumin 3.4 (*)    ALT 12 (*)    Total Bilirubin 0.1 (*)    All other components within normal limits  I-STAT CHEM 8, ED - Abnormal; Notable for the following:    Glucose, Bld 114 (*)    All other components within normal limits  PROTIME-INR  TYPE AND SCREEN  ABO/RH    Imaging Review Dg Chest Portable 1 View  07/27/2015  CLINICAL DATA:  No chest complaints. Hx of COPD. Pt is a current smoker. Pt c/o proximal right femur pain after stepping into her car and hearing her leg pop. No hx of prior injuries. Best obtainable images due to pt condition. EXAM: PORTABLE CHEST 1 VIEW COMPARISON:  05/23/2012 FINDINGS: Normal cardiac silhouette. Vascular stent again noted extending from the aortic arch. Cardiac recording device noted. Normal mediastinum and cardiac silhouette. Normal pulmonary vasculature. No evidence of effusion, infiltrate, or pneumothorax. No acute bony abnormality. IMPRESSION: No acute cardiopulmonary process. Electronically Signed   By: Suzy Bouchard M.D.   On: 07/27/2015 17:23   Dg Femur Port, Min 2 Views Right  07/27/2015  CLINICAL DATA:  Right femur pain after stepping into her car today and hearing a pop EXAM: RIGHT FEMUR PORTABLE 1 VIEW COMPARISON:  None. FINDINGS: Four views of the right femur submitted. There is oblique mild comminuted displaced fracture in proximal shaft of the right femur. There is at least 4 cm separation of bony fragments. No hip dislocation. IMPRESSION: Oblique mild comminuted displaced fracture proximal shaft of the right femur. Electronically Signed   By: Lahoma Crocker M.D.   On: 07/27/2015 17:25   I have personally reviewed and evaluated these images and lab results as part of my medical decision-making.   EKG Interpretation   Date/Time:  Sunday July 27 2015  17:46:43 EDT Ventricular Rate:  83 PR Interval:    QRS Duration: 102 QT Interval:  387 QTC Calculation: 455 R Axis:   73 Text Interpretation:  Sinus rhythm Short PR interval RSR' in V1 or V2,  right VCD or RVH since last tracing no significant change Confirmed by  Generoso Cropper  MD, Shahrukh Pasch ([id number]) on 07/27/2015 5:50:18 PM      MDM   Final diagnoses:  Closed fracture of right femur, unspecified fracture morphology, unspecified portion of femur, initial encounter ([place])   Likely femur fracture Consider pathlogical source given OVCA hx. Pain meds NPO  imaging Ortho consult 0- d/w Dr Rolena Infante - pt will need surgery tomorrow - will get traction pin todqay hospitalist consult for admission.  D/w Dr. Myna Hidalgo who agrees with temp admit orders Pain controled with fentanyl  Noemi Chapel, MD 07/27/15 [phone]

## 2015-07-27 NOTE — ED Notes (Signed)
1st dose of morphine did not go into IV - IV was infiltrated. Morphine went up into the bag. RN nor Rolena Infante, MD think patient received any of this dose.

## 2015-07-27 NOTE — ED Notes (Signed)
Informed consent signed, time out performed. Dr. Rolena Infante numbing patient's leg at this time.

## 2015-07-28 ENCOUNTER — Inpatient Hospital Stay (HOSPITAL_COMMUNITY): Payer: Medicare Other | Admitting: Certified Registered"

## 2015-07-28 ENCOUNTER — Encounter (HOSPITAL_COMMUNITY): Payer: Self-pay | Admitting: Certified Registered Nurse Anesthetist

## 2015-07-28 ENCOUNTER — Encounter (HOSPITAL_COMMUNITY)
Admission: EM | Disposition: A | Discharge status: Skilled Nursing Facility | Payer: Self-pay | Source: Home / Self Care | Attending: Internal Medicine

## 2015-07-28 ENCOUNTER — Inpatient Hospital Stay (HOSPITAL_COMMUNITY): Payer: Medicare Other

## 2015-07-28 DIAGNOSIS — S72001A Fracture of unspecified part of neck of right femur, initial encounter for closed fracture: Secondary | ICD-10-CM

## 2015-07-28 DIAGNOSIS — S7291XA Unspecified fracture of right femur, initial encounter for closed fracture: Secondary | ICD-10-CM | POA: Diagnosis present

## 2015-07-28 HISTORY — PX: FEMUR IM NAIL: SHX1597

## 2015-07-28 LAB — URINALYSIS, ROUTINE W REFLEX MICROSCOPIC
Bilirubin Urine: NEGATIVE
GLUCOSE, UA: NEGATIVE mg/dL
Ketones, ur: NEGATIVE mg/dL
Nitrite: NEGATIVE
PH: 6 (ref 5.0–8.0)
Protein, ur: NEGATIVE mg/dL
SPECIFIC GRAVITY, URINE: 1.011 (ref 1.005–1.030)

## 2015-07-28 LAB — BASIC METABOLIC PANEL
Anion gap: 6 (ref 5–15)
BUN: 5 mg/dL — AB (ref 6–20)
CHLORIDE: 106 mmol/L (ref 101–111)
CO2: 26 mmol/L (ref 22–32)
Calcium: 9.4 mg/dL (ref 8.9–10.3)
Creatinine, Ser: 0.71 mg/dL (ref 0.44–1.00)
GFR calc Af Amer: >60 mL/min (ref >60)
GFR calc non Af Amer: >60 mL/min (ref >60)
GLUCOSE: 116 mg/dL — AB (ref 65–99)
POTASSIUM: 4 mmol/L (ref 3.5–5.1)
Sodium: 138 mmol/L (ref 135–145)

## 2015-07-28 LAB — CBC
HEMATOCRIT: 37.5 % (ref 36.0–46.0)
Hemoglobin: 11.7 g/dL — ABNORMAL LOW (ref 12.0–15.0)
MCH: 32.5 pg (ref 26.0–34.0)
MCHC: 31.2 g/dL (ref 30.0–36.0)
MCV: 104.2 fL — AB (ref 78.0–100.0)
Platelets: 226 10*3/uL (ref 150–400)
RBC: 3.6 MIL/uL — ABNORMAL LOW (ref 3.87–5.11)
RDW: 13.5 % (ref 11.5–15.5)
WBC: 7.4 10*3/uL (ref 4.0–10.5)

## 2015-07-28 LAB — URINE MICROSCOPIC-ADD ON: SQUAMOUS EPITHELIAL / LPF: NONE SEEN

## 2015-07-28 LAB — VITAMIN B12: Vitamin B-12: 355 pg/mL (ref 180–914)

## 2015-07-28 LAB — MRSA PCR SCREENING: MRSA BY PCR: NEGATIVE

## 2015-07-28 SURGERY — INSERTION, INTRAMEDULLARY ROD, FEMUR
Anesthesia: General | Site: Hip | Laterality: Right

## 2015-07-28 MED ORDER — ROCURONIUM BROMIDE 100 MG/10ML IV SOLN
INTRAVENOUS | Status: DC | PRN
  Administered 2015-07-28: 50 mg via INTRAVENOUS

## 2015-07-28 MED ORDER — MIDAZOLAM HCL 2 MG/2ML IJ SOLN
INTRAMUSCULAR | Status: AC
  Filled 2015-07-28: qty 2

## 2015-07-28 MED ORDER — CLINDAMYCIN PHOSPHATE 600 MG/50ML IV SOLN
600.0000 mg | Freq: Four times a day (QID) | INTRAVENOUS | Status: AC
  Administered 2015-07-28 – 2015-07-29 (×2): 600 mg via INTRAVENOUS
  Filled 2015-07-28 (×2): qty 50

## 2015-07-28 MED ORDER — SENNA 8.6 MG PO TABS
1.0000 | ORAL_TABLET | Freq: Two times a day (BID) | ORAL | Status: DC
  Administered 2015-07-29 – 2015-08-04 (×12): 8.6 mg via ORAL
  Filled 2015-07-28 (×13): qty 1

## 2015-07-28 MED ORDER — ENOXAPARIN SODIUM 40 MG/0.4ML ~~LOC~~ SOLN
40.0000 mg | SUBCUTANEOUS | Status: DC
  Administered 2015-07-29 – 2015-08-04 (×7): 40 mg via SUBCUTANEOUS
  Filled 2015-07-28 (×7): qty 0.4

## 2015-07-28 MED ORDER — PHENYLEPHRINE HCL 10 MG/ML IJ SOLN
INTRAMUSCULAR | Status: DC | PRN
  Administered 2015-07-28: 120 ug via INTRAVENOUS
  Administered 2015-07-28 (×2): 200 ug via INTRAVENOUS

## 2015-07-28 MED ORDER — ENSURE ENLIVE PO LIQD
237.0000 mL | Freq: Two times a day (BID) | ORAL | Status: DC
  Administered 2015-07-29 – 2015-08-04 (×12): 237 mL via ORAL

## 2015-07-28 MED ORDER — MIDAZOLAM HCL 5 MG/5ML IJ SOLN
INTRAMUSCULAR | Status: DC | PRN
  Administered 2015-07-28: 2 mg via INTRAVENOUS

## 2015-07-28 MED ORDER — ACETAMINOPHEN 325 MG PO TABS
650.0000 mg | ORAL_TABLET | Freq: Four times a day (QID) | ORAL | Status: DC | PRN
  Administered 2015-08-03: 650 mg via ORAL
  Filled 2015-07-28: qty 2

## 2015-07-28 MED ORDER — LACTATED RINGERS IV SOLN
INTRAVENOUS | Status: DC
  Administered 2015-07-28 (×2): via INTRAVENOUS

## 2015-07-28 MED ORDER — CYANOCOBALAMIN 1000 MCG/ML IJ SOLN
1000.0000 ug | Freq: Every day | INTRAMUSCULAR | Status: AC
  Administered 2015-07-28 – 2015-07-30 (×3): 1000 ug via INTRAMUSCULAR
  Filled 2015-07-28 (×3): qty 1

## 2015-07-28 MED ORDER — SUGAMMADEX SODIUM 200 MG/2ML IV SOLN
INTRAVENOUS | Status: DC | PRN
  Administered 2015-07-28: 100 mg via INTRAVENOUS

## 2015-07-28 MED ORDER — LACTATED RINGERS IV SOLN
INTRAVENOUS | Status: DC
  Administered 2015-07-28: 23:00:00 via INTRAVENOUS

## 2015-07-28 MED ORDER — FENTANYL CITRATE (PF) 250 MCG/5ML IJ SOLN
INTRAMUSCULAR | Status: AC
  Filled 2015-07-28: qty 5

## 2015-07-28 MED ORDER — EPHEDRINE SULFATE 50 MG/ML IJ SOLN
INTRAMUSCULAR | Status: DC | PRN
  Administered 2015-07-28: 10 mg via INTRAVENOUS

## 2015-07-28 MED ORDER — LIDOCAINE 2% (20 MG/ML) 5 ML SYRINGE
INTRAMUSCULAR | Status: AC
  Filled 2015-07-28: qty 5

## 2015-07-28 MED ORDER — HYDROMORPHONE HCL 1 MG/ML IJ SOLN
INTRAMUSCULAR | Status: AC
  Filled 2015-07-28: qty 1

## 2015-07-28 MED ORDER — METOCLOPRAMIDE HCL 5 MG/ML IJ SOLN: 5.0000 mg | Freq: Three times a day (TID) | INTRAMUSCULAR | Status: DC | PRN

## 2015-07-28 MED ORDER — ONDANSETRON HCL 4 MG PO TABS
4.0000 mg | ORAL_TABLET | Freq: Four times a day (QID) | ORAL | Status: DC | PRN
Start: 2015-07-28 — End: 2015-08-04

## 2015-07-28 MED ORDER — ONDANSETRON HCL 4 MG/2ML IJ SOLN: 4.0000 mg | Freq: Four times a day (QID) | INTRAMUSCULAR | Status: DC | PRN

## 2015-07-28 MED ORDER — ONDANSETRON HCL 4 MG/2ML IJ SOLN
INTRAMUSCULAR | Status: DC | PRN
Start: 2015-07-28 — End: 2015-07-28
  Administered 2015-07-28: 4 mg via INTRAVENOUS

## 2015-07-28 MED ORDER — PHENOL 1.4 % MT LIQD: 1.0000 | OROMUCOSAL | Status: DC | PRN

## 2015-07-28 MED ORDER — 0.9 % SODIUM CHLORIDE (POUR BTL) OPTIME
TOPICAL | Status: DC | PRN
  Administered 2015-07-28: 1000 mL

## 2015-07-28 MED ORDER — FENTANYL CITRATE (PF) 100 MCG/2ML IJ SOLN
INTRAMUSCULAR | Status: DC | PRN
  Administered 2015-07-28 (×2): 100 ug via INTRAVENOUS
  Administered 2015-07-28: 50 ug via INTRAVENOUS

## 2015-07-28 MED ORDER — FENTANYL CITRATE (PF) 250 MCG/5ML IJ SOLN
INTRAMUSCULAR | Status: AC
  Filled 2015-07-28: qty 10

## 2015-07-28 MED ORDER — ONDANSETRON HCL 4 MG/2ML IJ SOLN
INTRAMUSCULAR | Status: AC
  Filled 2015-07-28: qty 4

## 2015-07-28 MED ORDER — MORPHINE SULFATE (PF) 2 MG/ML IV SOLN
1.0000 mg | INTRAVENOUS | Status: DC | PRN
  Administered 2015-07-28 – 2015-07-29 (×7): 2 mg via INTRAVENOUS
  Filled 2015-07-28 (×7): qty 1

## 2015-07-28 MED ORDER — OXYCODONE HCL 5 MG PO TABS: 5.0000 mg | ORAL_TABLET | Freq: Once | ORAL | Status: DC | PRN

## 2015-07-28 MED ORDER — DOCUSATE SODIUM 100 MG PO CAPS
100.0000 mg | ORAL_CAPSULE | Freq: Two times a day (BID) | ORAL | Status: DC
  Administered 2015-07-28 – 2015-08-04 (×13): 100 mg via ORAL
  Filled 2015-07-28 (×14): qty 1

## 2015-07-28 MED ORDER — ACETAMINOPHEN 650 MG RE SUPP: 650.0000 mg | Freq: Four times a day (QID) | RECTAL | Status: DC | PRN

## 2015-07-28 MED ORDER — LIDOCAINE HCL (CARDIAC) 20 MG/ML IV SOLN
INTRAVENOUS | Status: DC | PRN
  Administered 2015-07-28: 80 mg via INTRAVENOUS

## 2015-07-28 MED ORDER — PROPOFOL 10 MG/ML IV BOLUS
INTRAVENOUS | Status: DC | PRN
  Administered 2015-07-28: 150 mg via INTRAVENOUS

## 2015-07-28 MED ORDER — HYDROMORPHONE HCL 1 MG/ML IJ SOLN
0.2500 mg | INTRAMUSCULAR | Status: DC | PRN
  Administered 2015-07-28 (×2): 0.5 mg via INTRAVENOUS

## 2015-07-28 MED ORDER — MAGNESIUM CITRATE PO SOLN: 1.0000 | Freq: Once | ORAL | Status: DC | PRN

## 2015-07-28 MED ORDER — SORBITOL 70 % SOLN: 30.0000 mL | Freq: Every day | Status: DC | PRN

## 2015-07-28 MED ORDER — PHENYLEPHRINE HCL 10 MG/ML IJ SOLN
10.0000 mg | INTRAVENOUS | Status: DC | PRN
  Administered 2015-07-28: 40 ug/min via INTRAVENOUS

## 2015-07-28 MED ORDER — OXYCODONE HCL 5 MG/5ML PO SOLN: 5.0000 mg | Freq: Once | ORAL | Status: DC | PRN

## 2015-07-28 MED ORDER — CIPROFLOXACIN IN D5W 400 MG/200ML IV SOLN
400.0000 mg | Freq: Two times a day (BID) | INTRAVENOUS | Status: DC
  Administered 2015-07-28 – 2015-07-29 (×3): 400 mg via INTRAVENOUS
  Filled 2015-07-28 (×3): qty 200

## 2015-07-28 MED ORDER — PROPOFOL 10 MG/ML IV BOLUS
INTRAVENOUS | Status: AC
  Filled 2015-07-28: qty 20

## 2015-07-28 MED ORDER — METOCLOPRAMIDE HCL 5 MG PO TABS: 5.0000 mg | ORAL_TABLET | Freq: Three times a day (TID) | ORAL | Status: DC | PRN

## 2015-07-28 MED ORDER — HEPARIN SODIUM (PORCINE) 5000 UNIT/ML IJ SOLN: 5000.0000 [IU] | Freq: Three times a day (TID) | INTRAMUSCULAR | Status: DC

## 2015-07-28 MED ORDER — HYDROCODONE-ACETAMINOPHEN 5-325 MG PO TABS
1.0000 | ORAL_TABLET | Freq: Four times a day (QID) | ORAL | Status: DC | PRN
Start: 2015-07-28 — End: 2015-07-29
  Administered 2015-07-29: 2 via ORAL
  Administered 2015-07-29: 1 via ORAL
  Filled 2015-07-28: qty 1
  Filled 2015-07-28 (×2): qty 2

## 2015-07-28 MED ORDER — MENTHOL 3 MG MT LOZG
1.0000 | LOZENGE | OROMUCOSAL | Status: DC | PRN
  Administered 2015-07-28: 3 mg via ORAL
  Filled 2015-07-28: qty 9

## 2015-07-28 MED ORDER — HYDROMORPHONE HCL 1 MG/ML IJ SOLN
INTRAMUSCULAR | Status: DC | PRN
  Administered 2015-07-28: 1 mg via INTRAVENOUS

## 2015-07-28 SURGICAL SUPPLY — 47 items
ALCOHOL ISOPROPYL (RUBBING) (MISCELLANEOUS) ×3 IMPLANT
BLADE HELICAL TFNA 90 HIP (Anchor) ×2 IMPLANT
BLADE HELICAL TFNA 90MM HIP (Anchor) ×1 IMPLANT
BNDG COHESIVE 4X5 TAN STRL (GAUZE/BANDAGES/DRESSINGS) ×3 IMPLANT
CHLORAPREP W/TINT 26ML (MISCELLANEOUS) ×3 IMPLANT
COVER PERINEAL POST (MISCELLANEOUS) ×3 IMPLANT
COVER SURGICAL LIGHT HANDLE (MISCELLANEOUS) ×3 IMPLANT
DERMABOND ADVANCED (GAUZE/BANDAGES/DRESSINGS) ×2
DERMABOND ADVANCED .7 DNX12 (GAUZE/BANDAGES/DRESSINGS) ×1 IMPLANT
DRAPE C-ARM 42X72 X-RAY (DRAPES) ×3 IMPLANT
DRAPE C-ARMOR (DRAPES) ×3 IMPLANT
DRAPE IMP U-DRAPE 54X76 (DRAPES) ×6 IMPLANT
DRAPE STERI IOBAN 125X83 (DRAPES) ×3 IMPLANT
DRAPE U-SHAPE 47X51 STRL (DRAPES) ×6 IMPLANT
DRAPE UNIVERSAL PACK (DRAPES) ×3 IMPLANT
DRSG MEPILEX BORDER 4X4 (GAUZE/BANDAGES/DRESSINGS) ×6 IMPLANT
DRSG PAD ABDOMINAL 8X10 ST (GAUZE/BANDAGES/DRESSINGS) ×3 IMPLANT
ELECT REM PT RETURN 9FT ADLT (ELECTROSURGICAL) ×3
ELECTRODE REM PT RTRN 9FT ADLT (ELECTROSURGICAL) ×1 IMPLANT
FACESHIELD WRAPAROUND (MASK) IMPLANT
GLOVE BIO SURGEON STRL SZ8.5 (GLOVE) ×6 IMPLANT
GLOVE BIOGEL PI IND STRL 7.0 (GLOVE) ×3 IMPLANT
GLOVE BIOGEL PI IND STRL 8.5 (GLOVE) ×1 IMPLANT
GLOVE BIOGEL PI INDICATOR 7.0 (GLOVE) ×6
GLOVE BIOGEL PI INDICATOR 8.5 (GLOVE) ×2
GLOVE SURG SS PI 6.5 STRL IVOR (GLOVE) ×9 IMPLANT
GOWN STRL REUS W/ TWL LRG LVL3 (GOWN DISPOSABLE) ×3 IMPLANT
GOWN STRL REUS W/TWL 2XL LVL3 (GOWN DISPOSABLE) ×6 IMPLANT
GOWN STRL REUS W/TWL LRG LVL3 (GOWN DISPOSABLE) ×6
KIT ROOM TURNOVER OR (KITS) ×3 IMPLANT
MANIFOLD NEPTUNE II (INSTRUMENTS) ×3 IMPLANT
MARKER SKIN DUAL TIP RULER LAB (MISCELLANEOUS) ×3 IMPLANT
NAIL CANN 10X360MM RT STERILE (Nail) ×3 IMPLANT
NS IRRIG 1000ML POUR BTL (IV SOLUTION) ×3 IMPLANT
PACK GENERAL/GYN (CUSTOM PROCEDURE TRAY) ×3 IMPLANT
PAD ARMBOARD 7.5X6 YLW CONV (MISCELLANEOUS) ×6 IMPLANT
PADDING CAST ABS 4INX4YD NS (CAST SUPPLIES) ×2
PADDING CAST ABS COTTON 4X4 ST (CAST SUPPLIES) ×1 IMPLANT
REAMER ROD DEEP FLUTE 2.5X950 (INSTRUMENTS) ×3 IMPLANT
SCREW LOCKING 5.0X34MM (Screw) ×3 IMPLANT
SUT MNCRL AB 3-0 PS2 18 (SUTURE) ×3 IMPLANT
SUT MNCRL AB 3-0 PS2 27 (SUTURE) ×3 IMPLANT
SUT MON AB 2-0 CT1 27 (SUTURE) ×6 IMPLANT
SUT VIC AB 1 CT1 27 (SUTURE) ×2
SUT VIC AB 1 CT1 27XBRD ANBCTR (SUTURE) ×1 IMPLANT
TOWEL OR 17X24 6PK STRL BLUE (TOWEL DISPOSABLE) ×3 IMPLANT
TOWEL OR 17X26 10 PK STRL BLUE (TOWEL DISPOSABLE) ×3 IMPLANT

## 2015-07-28 NOTE — Transfer of Care (Signed)
Immediate Anesthesia Transfer of Care Note  Patient: Wanda Griffin  Procedure(s) Performed: Procedure(s): REMOVAL OF TRACTION PIN INTRAMEDULLART (IM) NAIL FEMORAL (Right)  Patient Location: PACU  Anesthesia Type:General  Level of Consciousness: awake and alert   Airway & Oxygen Therapy: Patient Spontanous Breathing and Patient connected to face mask oxygen  Post-op Assessment: Report given to RN, Post -op Vital signs reviewed and stable and Patient moving all extremities X 4  Post vital signs: Reviewed and stable  Last Vitals:  Filed Vitals:   07/28/15 0444 07/28/15 1316  BP: 118/56 148/62  Pulse: 92 96  Temp: 37.7 C 37.4 C  Resp: 15 16    Last Pain:  Filed Vitals:   07/28/15 1648  PainSc: 9          Complications: No apparent anesthesia complications

## 2015-07-28 NOTE — H&P (View-Only) (Signed)
Patient ID: MERLEEN DICE MRN: [id number] DOB/AGE: 59-Nov-1957 59 y.o.  Admit date: 07/27/2015  Admission Diagnoses:  Principal Problem:   Femur fracture ([place]) Active Problems:   Seizure disorder (HCC)   Anxiety and depression   Tobacco abuse   Macrocytosis without anemia   Hyperlipidemia   Chronic constipation   COPD (chronic obstructive pulmonary disease) (HCC)   Femur fracture, right (HCC)   HPI: Pleasant 60 year old female pt with a hx of a brain tumor, subdural hematoma, COPD and ovarian cancer.  She reports when she got in the car today as she twisted and fell to the ground.  She reports landing on her right leg and feeling a snap in her right femur.  She reports severe pain and being unable to bear weight after the trama.   Past Medical History: Past Medical History  Diagnosis Date  . Subdural hematoma ([place]) 2012    After tumor resection  . Brain tumor ([place]) 2012    Resected, had subsequent subdural hematoma  . Mental health disorder   . IBS (irritable bowel syndrome)     chronic constipation  . COPD (chronic obstructive pulmonary disease) (HCC)     FEV1 36% predicted  . 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 Tobacco abuse   . Benzodiazepine abuse   . Altered mental status     /notes "has episodes ~ q month/family" (05/23/2012)    Surgical History: Past Surgical History  Procedure Laterality Date  . Brain surgery  01/2012    Tumor resection  . Loop recorder implant    . Subdural hematoma evacuation via craniotomy Left 2009  . Subdural hematoma evacuation via craniotomy Right 2014  . Tubal ligation    . Vaginal hysterectomy    . Dilation and curettage of uterus    . Cardiac catheterization    . Coronary angioplasty with stent placement  ~ 2009    "1" (05/23/2012)    Family History: History reviewed. No pertinent family history.  Social History: Social History   Social History  . Marital Status: Married   Spouse Name: N/A  . Number of Children: N/A  . Years of Education: N/A   Occupational History  . Not on file.   Social History Main Topics  . Smoking status: Current Every Day Smoker -- 0.50 packs/day for 20 years    Types: Cigarettes  . Smokeless tobacco: Not on file     Comment: 05/23/2012 offered smoking cessation materials; pt declines  . Alcohol Use: No  . Drug Use: Yes    Special: Marijuana     Comment: 05/23/2012 "last marijuana ~ 3 wk ago; usually smoke once q 2-3 months"  . Sexual Activity: Yes   Other Topics Concern  . Not on file   Social History Narrative   Lives with husband in [place], [place]. [facility] is [facility] from [facility].     Allergies: Ampicillin; Asa; Colchicine; and Toradol  Medications: I have reviewed the patient's current medications.  Vital Signs: Patient Vitals for the past 24 hrs:  BP Temp Temp src Pulse Resp SpO2  07/27/15 1915 135/63 mmHg - - 89 17 95 %  07/27/15 1845 158/69 mmHg - - 87 16 96 %  07/27/15 1730 164/63 mmHg - - 82 - 99 %  07/27/15 1627 172/80 mmHg 98.6 F (37 C) Oral 80 20 99 %  07/27/15 1618 - - - - - 98 %  Radiology: Dg Chest Portable 1 View  07/27/2015  CLINICAL DATA:  No chest complaints. Hx of COPD. Pt is a current smoker. Pt c/o proximal right femur pain after stepping into her car and hearing her leg pop. No hx of prior injuries. Best obtainable images due to pt condition. EXAM: PORTABLE CHEST 1 VIEW COMPARISON:  05/23/2012 FINDINGS: Normal cardiac silhouette. Vascular stent again noted extending from the aortic arch. Cardiac recording device noted. Normal mediastinum and cardiac silhouette. Normal pulmonary vasculature. No evidence of effusion, infiltrate, or pneumothorax. No acute bony abnormality. IMPRESSION: No acute cardiopulmonary process. Electronically Signed   By: Suzy Bouchard M.D.   On: 07/27/2015 17:23   Dg Femur Port, Min 2 Views Right  07/27/2015  CLINICAL DATA:  Right femur pain after stepping  into her car today and hearing a pop EXAM: RIGHT FEMUR PORTABLE 1 VIEW COMPARISON:  None. FINDINGS: Four views of the right femur submitted. There is oblique mild comminuted displaced fracture in proximal shaft of the right femur. There is at least 4 cm separation of bony fragments. No hip dislocation. IMPRESSION: Oblique mild comminuted displaced fracture proximal shaft of the right femur. Electronically Signed   By: Lahoma Crocker M.D.   On: 07/27/2015 17:25    Labs:  Recent Labs  07/27/15 1712 07/27/15 1723  WBC 7.2  --   RBC 3.82*  --   HCT 40.2 43.0  PLT 255  --     Recent Labs  07/27/15 1712 07/27/15 1723  NA 139 141  K 3.9 3.8  CL 110 108  CO2 23  --   BUN 7 8  CREATININE 0.78 0.80  GLUCOSE 117* 114*  CALCIUM 9.1  --     Recent Labs  07/27/15 1712  INR 0.98    Review of Systems: ROS  Physical Exam: Neurologically intact ABD soft Sensation intact distally TTP of the right upper LE Decreased ROM of RLE Deformity noted of the right femur  Assessment and Plan: Traction pin will be put in while in ED NPO after midnight Surgical intervention tomorrows Hospitalist consulted to admit  Melina Schools, MD [place] [phone]  Agree with above Closed injury  Will require ORIF Discussed with Dr Lyla Glassing Proximal tibial traction pin placed in ER Consent obtained  Post-pin xrays pending

## 2015-07-28 NOTE — Anesthesia Preprocedure Evaluation (Addendum)
Anesthesia Evaluation  Patient identified by MRN, date of birth, ID band Patient awake    Reviewed: Allergy & Precautions, H&P , NPO status , Patient's Chart, lab work & pertinent test results  Airway Mallampati: II  TM Distance: >3 FB Neck ROM: full    Dental  (+) Edentulous Upper, Edentulous Lower, Dental Advisory Given   Pulmonary COPD, Current Smoker,    breath sounds clear to auscultation       Cardiovascular + Peripheral Vascular Disease   Rhythm:regular Rate:Normal     Neuro/Psych    GI/Hepatic   Endo/Other    Renal/GU      Musculoskeletal   Abdominal   Peds  Hematology   Anesthesia Other Findings   Reproductive/Obstetrics                            Anesthesia Physical Anesthesia Plan  ASA: III  Anesthesia Plan: General   Post-op Pain Management:    Induction: Intravenous  Airway Management Planned: Oral ETT  Additional Equipment:   Intra-op Plan:   Post-operative Plan: Extubation in OR  Informed Consent: I have reviewed the patients History and Physical, chart, labs and discussed the procedure including the risks, benefits and alternatives for the proposed anesthesia with the patient or authorized representative who has indicated his/her understanding and acceptance.     Plan Discussed with: CRNA, Anesthesiologist and Surgeon  Anesthesia Plan Comments:         Anesthesia Quick Evaluation

## 2015-07-28 NOTE — Progress Notes (Signed)
Asked to assume care by Dr. Rolena Infante for R femur fx. Tibial traction pin placed yesterday. Patient seen and evaluated. She denies other injuries.  NAD RLE: pinsites c/d/i. (+) TA/GS/EHL. 2+ DP. Reports SILT.  Plan: R femur fx, R IT fx To OR today for IM nail R femur Cont bed rest, NWB RLE for now Cont traction NPO  Hold chemical DVT ppx

## 2015-07-28 NOTE — Progress Notes (Signed)
   07/28/15 1010  Clinical Encounter Type  Visited With Patient  Visit Type Initial  Spiritual Encounters  Spiritual Needs Emotional;Prayer  Chaplain visited patient on initial rounds.  Patient very emotional.  Patient shared she was not doing well and experiencing a lot of pain.  Chaplain offered prayer.  Chaplain will Visit on afternoon rounds.

## 2015-07-28 NOTE — Anesthesia Procedure Notes (Signed)
Procedure Name: Intubation Date/Time: 07/28/2015 3:28 PM Performed by: Lance Coon Pre-anesthesia Checklist: Patient identified, Emergency Drugs available, Suction available, Timeout performed and Patient being monitored Patient Re-evaluated:Patient Re-evaluated prior to inductionOxygen Delivery Method: Circle system utilized Preoxygenation: Pre-oxygenation with 100% oxygen Intubation Type: IV induction Ventilation: Mask ventilation without difficulty Laryngoscope Size: Miller and 3 Grade View: Grade I Tube type: Oral Number of attempts: 1 Airway Equipment and Method: Stylet Placement Confirmation: ETT inserted through vocal cords under direct vision,  positive ETCO2 and breath sounds checked- equal and bilateral Secured at: 21 cm Tube secured with: Tape Dental Injury: Teeth and Oropharynx as per pre-operative assessment

## 2015-07-28 NOTE — Interval H&P Note (Signed)
History and Physical Interval Note:  07/28/2015 3:57 PM  Wanda Griffin  has presented today for surgery, with the diagnosis of right femur fracture  The various methods of treatment have been discussed with the patient and family. After consideration of risks, benefits and other options for treatment, the patient has consented to  Procedure(s): INTRAMEDULLARY (IM) NAIL FEMORAL (Right) as a surgical intervention .  The patient's history has been reviewed, patient examined, no change in status, stable for surgery.  I have reviewed the patient's chart and labs.  Questions were answered to the patient's satisfaction.     The risks, benefits, and alternatives were discussed with the patient. There are risks associated with the surgery including, but not limited to, problems with anesthesia (death), infection, differences in leg length/angulation/rotation, fracture of bones, loosening or failure of implants, malunion, nonunion, hematoma (blood accumulation) which may require surgical drainage, blood clots, pulmonary embolism, nerve injury (foot drop), and blood vessel injury. The patient understands these risks and elects to proceed.    Wanda Griffin, Horald Pollen

## 2015-07-28 NOTE — Progress Notes (Signed)
Initial Nutrition Assessment  DOCUMENTATION CODES:   Non-severe (moderate) malnutrition in context of chronic illness  INTERVENTION:  Once diet advances, provide Ensure Enlive po BID, each supplement provides 350 kcal and 20 grams of protein.  Recommend obtaining new weight to fully assess weight trends.   NUTRITION DIAGNOSIS:   Malnutrition related to chronic illness as evidenced by severe depletion of muscle mass, moderate depletion of body fat.  GOAL:   Patient will meet greater than or equal to 90% of their needs  MONITOR:   Diet advancement, Supplement acceptance, Weight trends, Labs, I & O's, Skin  REASON FOR ASSESSMENT:   Consult Hip fracture protocol  ASSESSMENT:   60 y.o. 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 plans for operative management of the right femur fracture.  Pt is currently NPO for surgery today. Pt reports eating well PTA with usual consumption of at least 3 meals a day. Usual body weight reported to be 88 lbs. Noted no new weight recorded. Recommend obtaining new weight to fully assess weight trends. RD to order Ensure to aid in caloric and protein needs as well as in healing. Nursing staff to provide once diet advances.   Nutrition-Focused physical exam completed. Findings are mild to moderate fat depletion, moderate to severe muscle depletion, and no edema.   Labs and medications reviewed.   Diet Order:  Diet NPO time specified Except for: Sips with Meds  Skin:  Reviewed, no issues  Last BM:  6/24  Height:   Ht Readings from Last 1 Encounters:  05/23/12 5\' 2"  (1.575 m)    Weight:   Wt Readings from Last 1 Encounters:  05/23/12 102  lb 11.2 oz (46.584 kg)    Ideal Body Weight:  50 kg  BMI:  There is no weight on file to calculate BMI.  Estimated Nutritional Needs:   Kcal:  1400-1700  Protein:  60-70 grams  Fluid:  >/= 1.5 L/day  EDUCATION NEEDS:   No education needs identified at this time  Corrin Parker, MS, RD, LDN Pager # [phone] After hours/ weekend pager # [phone]

## 2015-07-28 NOTE — Discharge Instructions (Signed)
Dr. Rod Can Adult Hip & Knee Specialist [facility] [address] [place], [place] [address] [phone]   POSTOPERATIVE DIRECTIONS    Hip Rehabilitation, Guidelines Following Surgery   WEIGHT BEARING Other:  Touch Down Weight Bearing   HOME CARE INSTRUCTIONS  Remove items at home which could result in a fall. This includes throw rugs or furniture in walking pathways.  Continue medications as instructed at time of discharge.  You may have some home medications which will be placed on hold until you complete the course of blood thinner medication.  4 days after discharge, you may start showering. No tub baths or soaking your incisions. Do not put on socks or shoes without following the instructions of your caregivers.   Sit on chairs with arms. Use the chair arms to help push yourself up when arising.  Arrange for the use of a toilet seat elevator so you are not sitting low.   Walk with walker as instructed.  You may resume a sexual relationship in one month or when given the OK by your caregiver.  Use walker as long as suggested by your caregivers.  Avoid periods of inactivity such as sitting longer than an hour when not asleep. This helps prevent blood clots.  You may return to work once you are cleared by Engineer, production.  Do not drive a car for 6 weeks or until released by your surgeon.  Do not drive while taking narcotics.  Wear elastic stockings for two weeks following surgery during the day but you may remove then at night.  Make sure you keep all of your appointments after your operation with all of your doctors and caregivers. You should call the office at the above phone number and make an appointment for approximately two weeks after the date of your surgery. Please pick up a stool softener and laxative for home use as long as you are requiring pain medications.  ICE to the affected hip every three hours for 30 minutes at a time and  then as needed for pain and swelling. Continue to use ice on the hip for pain and swelling from surgery. You may notice swelling that will progress down to the foot and ankle.  This is normal after surgery.  Elevate the leg when you are not up walking on it.   It is important for you to complete the blood thinner medication as prescribed by your doctor.  Continue to use the breathing machine which will help keep your temperature down.  It is common for your temperature to cycle up and down following surgery, especially at night when you are not up moving around and exerting yourself.  The breathing machine keeps your lungs expanded and your temperature down.  RANGE OF MOTION AND STRENGTHENING EXERCISES  These exercises are designed to help you keep full movement of your hip joint. Follow your caregiver's or physical therapist's instructions. Perform all exercises about fifteen times, three times per day or as directed. Exercise both hips, even if you have had only one joint replacement. These exercises can be done on a training (exercise) mat, on the floor, on a table or on a bed. Use whatever works the best and is most comfortable for you. Use music or television while you are exercising so that the exercises are a pleasant break in your day. This will make your life better with the exercises acting as a break in routine you can look forward to.  Lying on your  back, slowly slide your foot toward your buttocks, raising your knee up off the floor. Then slowly slide your foot back down until your leg is straight again.  Lying on your back spread your legs as far apart as you can without causing discomfort.  Lying on your side, raise your upper leg and foot straight up from the floor as far as is comfortable. Slowly lower the leg and repeat.  Lying on your back, tighten up the muscle in the front of your thigh (quadriceps muscles). You can do this by keeping your leg straight and trying to raise your heel off  the floor. This helps strengthen the largest muscle supporting your knee.  Lying on your back, tighten up the muscles of your buttocks both with the legs straight and with the knee bent at a comfortable angle while keeping your heel on the floor.   SKILLED REHAB INSTRUCTIONS: If the patient is transferred to a skilled rehab facility following release from the hospital, a list of the current medications will be sent to the facility for the patient to continue.  When discharged from the skilled rehab facility, please have the facility set up the patient's Magna prior to being released. Also, the skilled facility will be responsible for providing the patient with their medications at time of release from the facility to include their pain medication and their blood thinner medication. If the patient is still at the rehab facility at time of the two week follow up appointment, the skilled rehab facility will also need to assist the patient in arranging follow up appointment in our office and any transportation needs.  MAKE SURE YOU:  Understand these instructions.  Will watch your condition.  Will get help right away if you are not doing well or get worse.  Pick up stool softner and laxative for home use following surgery while on pain medications. Daily dry dressing changes as needed. In 4 days, you may remove your dressings and begin taking showers - no tub baths or soaking the incisions. Continue to use ice for pain and swelling after surgery. Do not use any lotions or creams on the incision until instructed by your surgeon.

## 2015-07-28 NOTE — Progress Notes (Signed)
PROGRESS NOTE                                                                                                                                                                                                             Patient Demographics:    Wanda Griffin, is a 60 y.o. female, DOB - 25-Dec-1955, [id number]  Admit date - 07/27/2015   Admitting Physician [name], MD  Outpatient Primary MD for the patient is [name], MD  LOS - 1   Chief Complaint  Patient presents with  . Fall  . Leg Pain       Brief Narrative   60 y.o. 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  Assessment  & Plan :    Principal Problem:   Femur fracture (HCC) Active Problems:   Seizure disorder (HCC)   Anxiety and depression   Tobacco abuse   Macrocytosis without anemia   Hyperlipidemia   Chronic constipation   COPD (chronic obstructive pulmonary disease) (HCC)   Femur fracture, right (HCC)   Proximal femur fracture, right  - Secondary to fall, orthopedic consult greatly appreciated, plan for surgical repair today, continue with when necessary pain medication. - PT consult after surgery  UTI - We'll start on ciprofloxacin, follow on urine culture  Anxiety and depression  - Continue current management with Seroquel and prn Xanax   Seizure disorder  - Likely secondary to remote brain tumor s/p resection  - Pt reports no recent seizure  - Continue current management with Depakote   COPD  - Stable, no evidence of acute exacerbation  - Continue ICS/LABA with Dulera while  in hospital  - DuoNeb q4h prn SOB or wheezing   Hyperlipidemia  - Continue Crestor   Tobacco abuse  - Nicotine patch available  - RN asked to provide smoking cessation information prior to discharge   Macrocytosis  - MCV 105.2 without anemia  - B 12 normal, but on the lower side, so will give supplement for  3 days IM injections, follow on folate   Code Status : Full  Family Communication  : none at bedside  Disposition Plan  : pending PT evaluation  post op  Consults  :  ortho  Procedures  : none  DVT Prophylaxis  :  Heparin - SCDs   Lab Results  Component Value Date   PLT 226 07/28/2015    Antibiotics  :    Anti-infectives    Start     Dose/Rate Route Frequency Ordered Stop   07/28/15 1530  clindamycin (CLEOCIN) IVPB 900 mg     900 mg 100 mL/hr over 30 Minutes Intravenous To ShortStay Surgical 07/27/15 2245 07/29/15 1530        Objective:   Filed Vitals:   07/27/15 2130 07/27/15 2145 07/27/15 2300 07/28/15 0444  BP:  136/64 145/60 118/56  Pulse: 93 81 92 92  Temp:   99 F (37.2 C) 99.9 F (37.7 C)  TempSrc:   Oral   Resp: 15 14 16 15   SpO2: 95%  95% 95%    Wt Readings from Last 3 Encounters:  05/23/12 46.584 kg (102 lb 11.2 oz)     Intake/Output Summary (Last 24 hours) at 07/28/15 1204 Last data filed at 07/28/15 1026  Gross per 24 hour  Intake    325 ml  Output   1420 ml  Net  -1095 ml     Physical Exam  Awake Alert, Oriented X 3,  Supple Neck,No JVD,  Symmetrical Chest wall movement, Good air movement bilaterally, CTAB RRR,No Gallops,Rubs or new Murmurs, No Parasternal Heave +ve B.Sounds, Abd Soft, No tenderness, No Cyanosis, Clubbing or edema, No new Rash or bruise , proximal right tibial traction     Data Review:    CBC  Recent Labs Lab 07/27/15 1712 07/27/15 1723 07/28/15 0553  WBC 7.2  --  7.4  HGB 12.4 14.6 11.7*  HCT 40.2 43.0 37.5  PLT 255  --  226  MCV 105.2*  --  104.2*  MCH 32.5  --  32.5  MCHC 30.8   --  31.2  RDW 13.6  --  13.5  LYMPHSABS 1.7  --   --   MONOABS 0.4  --   --   EOSABS 0.1  --   --   BASOSABS 0.0  --   --     Chemistries   Recent Labs Lab 07/27/15 1712 07/27/15 1723 07/28/15 0553  NA 139 141 138  K 3.9 3.8 4.0  CL 110 108 106  CO2 23  --  26  GLUCOSE 117* 114* 116*  BUN 7 8 5*  CREATININE 0.78 0.80 0.71  CALCIUM 9.1  --  9.4  AST 18  --   --   ALT 12*  --   --   ALKPHOS 78  --   --   BILITOT 0.1*  --   --    ------------------------------------------------------------------------------------------------------------------ No results for input(s): CHOL, HDL, LDLCALC, TRIG, CHOLHDL, LDLDIRECT in the last 72 hours.  No results found for: HGBA1C ------------------------------------------------------------------------------------------------------------------ No results for input(s): TSH, T4TOTAL, T3FREE, THYROIDAB in the last 72 hours.  Invalid input(s): FREET3 ------------------------------------------------------------------------------------------------------------------  Recent Labs  07/27/15 2259  VITAMINB12 355    Coagulation profile  Recent Labs Lab 07/27/15 1712  INR 0.98    No results for input(s): DDIMER in the last 72 hours.  Cardiac Enzymes No results for input(s): CKMB, TROPONINI, MYOGLOBIN in the last 168 hours.  Invalid input(s): CK ------------------------------------------------------------------------------------------------------------------ No results found  for: BNP  Inpatient Medications  Scheduled Meds: . clindamycin (CLEOCIN) IV  900 mg Intravenous To SS-Surg  . divalproex  500 mg Oral TID  . lubiprostone  24 mcg Oral BID WC  . mometasone-formoterol  2 puff Inhalation BID  . QUEtiapine  25 mg Oral QHS  . rosuvastatin  20 mg Oral Daily   Continuous Infusions:  PRN Meds:.ALPRAZolam, bisacodyl, HYDROcodone-acetaminophen, ipratropium-albuterol, methocarbamol **OR** methocarbamol (ROBAXIN)  IV, morphine injection,  nicotine, polyethylene glycol, zolpidem  Micro Results Recent Results (from the past 240 hour(s))  MRSA PCR Screening     Status: None   Collection Time: 07/27/15 11:19 PM  Result Value Ref Range Status   MRSA by PCR NEGATIVE NEGATIVE Final    Comment:        The GeneXpert MRSA Assay (FDA approved for NASAL specimens only), is one component of a comprehensive MRSA colonization surveillance program. It is not intended to diagnose MRSA infection nor to guide or monitor treatment for MRSA infections.     Radiology Reports Ct Femur Right Wo Contrast  07/27/2015  CLINICAL DATA:  60 year old female with right femur fracture. EXAM: CT OF THE RIGHT FEMUR WITHOUT CONTRAST TECHNIQUE: Multidetector CT imaging was performed according to the standard protocol. Multiplanar CT image reconstructions were also generated. COMPARISON:  Radiograph dated 07/27/2015 FINDINGS: There is a nondisplaced cortical fracture of the greater trochanter and intertrochanteric ridge. No definite femoral neck fracture identified, however evaluation is limited due to osteopenia. There is a posteriorly displaced comminuted oblique fracture of the proximal third and mid right femoral shaft. There is approximately 4 cm overlap of the fracture fragments. The fracture fragments demonstrate sharp changes without evidence of underlying osseous lesion. There is no dislocation. No significant hematoma. IMPRESSION: Nondisplaced cortical fracture of the intertrochanteric ridge and a posteriorly dislocated, comminuted and oblique fracture of the right femoral diaphysis with approximately 4 cm overlap. Electronically Signed   By: Anner Crete M.D.   On: 07/27/2015 20:08   Dg Chest Portable 1 View  07/27/2015  CLINICAL DATA:  No chest complaints. Hx of COPD. Pt is a current smoker. Pt c/o proximal right femur pain after stepping into her car and hearing her leg pop. No hx of prior injuries. Best obtainable images due to pt condition.  EXAM: PORTABLE CHEST 1 VIEW COMPARISON:  05/23/2012 FINDINGS: Normal cardiac silhouette. Vascular stent again noted extending from the aortic arch. Cardiac recording device noted. Normal mediastinum and cardiac silhouette. Normal pulmonary vasculature. No evidence of effusion, infiltrate, or pneumothorax. No acute bony abnormality. IMPRESSION: No acute cardiopulmonary process. Electronically Signed   By: Suzy Bouchard M.D.   On: 07/27/2015 17:23   Dg Tibia/fibula Right Port  07/27/2015  CLINICAL DATA:  60 year old female with tibia fibula skin placement EXAM: PORTABLE RIGHT TIBIA AND FIBULA - 2 VIEW COMPARISON:  CT dated 07/27/2015 FINDINGS: Two views of the knee and proximal tibia and fibula demonstrate a fixation pin extending through the proximal tibial metadiaphysis through the fibular head. A stabilizing metallic rod is noted along the medial aspect of the knee and distal lower extremity. No acute fracture for dislocation of the visualized bones identified. There is no joint effusion. The soft tissues appear unremarkable. IMPRESSION: Stabilizing device along the medial aspect of the leg and a transfixing screw through the proximal tibia and fibula. Normal alignment. Electronically Signed   By: Anner Crete M.D.   On: 07/27/2015 21:01   Dg Femur [place], Min 2 Views Right  07/27/2015  CLINICAL DATA:  Right femur pain after stepping into her car today and hearing a pop EXAM: RIGHT FEMUR PORTABLE 1 VIEW COMPARISON:  None. FINDINGS: Four views of the right femur submitted. There is oblique mild comminuted displaced fracture in proximal shaft of the right femur. There is at least 4 cm separation of bony fragments. No hip dislocation. IMPRESSION: Oblique mild comminuted displaced fracture proximal shaft of the right femur. Electronically Signed   By: Lahoma Crocker M.D.   On: 07/27/2015 17:25    Time Spent in minutes  25 minutes   ELGERGAWY, DAWOOD M.D on 07/28/2015 at 12:04 PM  Between 7am to 7pm -  Pager - [phone]  After 7pm go to [email or web address] - password [facility]  [facility] -  Office  [phone]

## 2015-07-28 NOTE — Brief Op Note (Signed)
07/28/2015  5:36 PM  PATIENT:  Wanda Griffin  60 y.o. female  PRE-OPERATIVE DIAGNOSIS:  right femur fracture  POST-OPERATIVE DIAGNOSIS:  right femur fracture  PROCEDURE:  Procedure(s): REMOVAL OF TRACTION PIN INTRAMEDULLART (IM) NAIL FEMORAL (Right)  SURGEON:  Surgeon(s) and Role:    * Rod Can, MD - Primary  PHYSICIAN ASSISTANT:   ASSISTANTS: justin queen, rnfa   ANESTHESIA:   general  EBL:  Total I/O In: [id number] [I.V.:1325; IV Piggyback:200] Out: 650 [Urine:500; Blood:150]  BLOOD ADMINISTERED:none  DRAINS: none   LOCAL MEDICATIONS USED:  NONE  SPECIMEN:  No Specimen  DISPOSITION OF SPECIMEN:  N/A  COUNTS:  YES  TOURNIQUET:  * No tourniquets in log *  DICTATION: .Other Dictation: Dictation Number [id number]  PLAN OF CARE: Admit to inpatient   PATIENT DISPOSITION:  PACU - hemodynamically stable.   Delay start of Pharmacological VTE agent (>24hrs) due to surgical blood loss or risk of bleeding: no

## 2015-07-28 NOTE — Care Management Important Message (Signed)
Important Message  Patient Details  Name: Wanda Griffin MRN: [id number] Date of Birth: [birth year]   Medicare Important Message Given:  Yes    [name] 07/28/2015, 8:58 AM

## 2015-07-29 LAB — CBC
HCT: 32.2 % — ABNORMAL LOW (ref 36.0–46.0)
HEMOGLOBIN: 10.3 g/dL — AB (ref 12.0–15.0)
MCH: 33.8 pg (ref 26.0–34.0)
MCHC: 32 g/dL (ref 30.0–36.0)
MCV: 105.6 fL — ABNORMAL HIGH (ref 78.0–100.0)
PLATELETS: 175 10*3/uL (ref 150–400)
RBC: 3.05 MIL/uL — AB (ref 3.87–5.11)
RDW: 13.7 % (ref 11.5–15.5)
WBC: 7.7 10*3/uL (ref 4.0–10.5)

## 2015-07-29 LAB — VITAMIN D 25 HYDROXY (VIT D DEFICIENCY, FRACTURES): Vit D, 25-Hydroxy: 10.4 ng/mL — ABNORMAL LOW (ref 30.0–100.0)

## 2015-07-29 LAB — BASIC METABOLIC PANEL
Anion gap: 7 (ref 5–15)
CHLORIDE: 101 mmol/L (ref 101–111)
CO2: 30 mmol/L (ref 22–32)
CREATININE: 0.87 mg/dL (ref 0.44–1.00)
Calcium: 9.1 mg/dL (ref 8.9–10.3)
Glucose, Bld: 119 mg/dL — ABNORMAL HIGH (ref 65–99)
Potassium: 4.1 mmol/L (ref 3.5–5.1)
SODIUM: 138 mmol/L (ref 135–145)

## 2015-07-29 LAB — URINE CULTURE: CULTURE: NO GROWTH

## 2015-07-29 LAB — FOLATE RBC
FOLATE, HEMOLYSATE: 372.3 ng/mL
FOLATE, RBC: 1004 ng/mL (ref >498)
HEMATOCRIT: 37.1 % (ref 34.0–46.6)

## 2015-07-29 MED ORDER — OXYCODONE-ACETAMINOPHEN 5-325 MG PO TABS
1.0000 | ORAL_TABLET | ORAL | Status: DC | PRN
Start: 2015-07-29 — End: 2015-07-31
  Administered 2015-07-29 – 2015-07-31 (×5): 2 via ORAL
  Filled 2015-07-29 (×5): qty 2

## 2015-07-29 MED ORDER — CIPROFLOXACIN HCL 500 MG PO TABS
500.0000 mg | ORAL_TABLET | Freq: Two times a day (BID) | ORAL | Status: DC
  Administered 2015-07-29 – 2015-08-01 (×6): 500 mg via ORAL
  Filled 2015-07-29 (×6): qty 1

## 2015-07-29 NOTE — Op Note (Signed)
Wanda Griffin, PARROT NO.:  [id number]  MEDICAL RECORD NO.:  [id number]  LOCATION:  5N06C                        FACILITY:  [facility]  PHYSICIAN:  Rod Can, MD     DATE OF BIRTH:  Dec 05, 1955  DATE OF PROCEDURE:  07/28/2015 DATE OF DISCHARGE:                              OPERATIVE REPORT   SURGEON:  Rod Can, MD.  ASSISTANT:  Ky Barban, RNFA.  PREOPERATIVE DIAGNOSES: 1. Comminuted right proximal third femoral shaft fracture. 2. Nondisplaced right intertrochanteric femur fracture.  POSTOPERATIVE DIAGNOSES: 1. Comminuted right proximal third femoral shaft fracture. 2. Nondisplaced right intertrochanteric femur fracture.  PROCEDURES PERFORMED:  Intramedullary fixation of right femur.  IMPLANTS: 1. Synthes TFNA nail size 10 x 360 mm, 130 degrees. 2. TFNA helical blade size 90 mm. 3. 5.0 mm x 34 mm distal interlocking screw x1.  ANESTHESIA:  General.  EBL:  150 mL.  ANTIBIOTICS:  900 mg clindamycin.  COMPLICATIONS:  None.  SPECIMENS:  None.  TUBES AND DRAINS:  None.  DISPOSITION:  Stable to PACU.  INDICATIONS:  The patient is a 60 year old female with multiple medical problems, including history of previous ovarian cancer and tobacco abuse who had a ground level fall yesterday.  She had right thigh pain and inability to weight bear.  She came to the emergency department and x- rays revealed a nondisplaced right intertrochanteric femur fracture as well as a comminuted fracture to the proximal 3rd of the femoral shaft. CT scan was performed of the right femur, which did not demonstrate any evidence of pathologic fracture.  Dr. Rolena Infante placed her in skeletal traction by way of a tibial pin. We discussed the risks, benefits, and alternatives to surgical fixation and she elected to proceed.  DESCRIPTION OF PROCEDURE IN DETAIL:  I identified the patient in the holding area using 2 identifiers.  The surgical site was marked by myself.  The  patient was taken to the operating room.  General anesthesia was induced.  I began by removing her tibial traction pin.  I clipped the pin medially, flushed with the skin.  I prepped the end with Betadine.  I used a drill to remove the pin from medial to lateral.  The wound was dressed.  She was then transferred to the [facility] table.  The fracture was reduced with traction, rotation, and adduction.  The left lower extremity was scissored underneath the right hip.  Right hip was prepped and draped in normal sterile surgical fashion.  Time-out was called verifying side and site of surgery.  She did receive IV clindamycin within 60 minutes of beginning the procedure.  I began by making a 4 cm incision proximal to the tip of the trochanter.  I used the awl to determine the standard starting point for trochanteric entry nail under live AP and lateral fluoroscopic control.  I inserted a guide pin.  I then used the entry reamer.  I then passed the reduction finger to the fracture site.  I manipulated the proximal fragment and then I passed the guidewire down to the physeal scar of the knee.  I measured the length of the nail.  I sequentially reamed up to an 11.5 mm reamer with  excellent chatter.  Therefore, I selected a 10 mm nail.  I seated the nail to the appropriate depth without any difficulty.  I made a stab incision, and I inserted the cannula down to the bone for the cephalomedullary device.  Under AP and lateral fluoroscopic control, I inserted the guidewire.  I then measured, reamed, and placed the real TFNA blade.  The intertrochanteric fracture remained nondisplaced.  Upon passing the nail, the femur fracture did reduce very nicely.  She did have a large lateral butterfly fragment.  Medially, she had acceptable cortical contact.  I tightened the set screw down.  I removed the jig. I then turned my attention distally.  Using perfect circle technique, I placed a single distal interlocking  screw.  Final AP and lateral fluoroscopy views were taken to determine appropriate hardware placement and fracture reduction.  Tip apex distance was appropriate.  There was no chondral penetration.  The wounds were copiously irrigated with saline.  I closed the wounds in layers with #1 Vicryl for the fascia, 2- 0 Monocryl for the deep dermal layer, and running 3-0 Monocryl subcuticular stitch.  Glue was applied to the skin.  Once the glue was hardened, sterile dressings were applied.  She was then transferred to her bed, extubated, taken to PACU in stable condition.  Sponge, needle, and instrument counts were correct at the end of the case x2.  There were no known complications.  We will readmit the patient to the hospitalist.  She will be touchdown weightbearing right lower extremity.  We will put her on Lovenox for DVT prophylaxis.  We will have her work with physical and occupational therapy.  She will need disposition planning.  She was counseled on smoking cessation and the risk of nonunion and malunion.  I will see her in the office 2 weeks after discharge.          ______________________________ Rod Can, MD     BS/MEDQ  D:  07/28/2015  T:  07/29/2015  Job:  [id number]

## 2015-07-29 NOTE — Progress Notes (Signed)
PROGRESS NOTE                                                                                                                                                                                                             Patient Demographics:    Wanda Griffin, is a 60 y.o. female, DOB - 1956-01-17, [id number]  Admit date - 07/27/2015   Admitting Physician [name], MD  Outpatient Primary MD for the patient is Wanda Junior, MD  LOS - 2   Chief Complaint  Patient presents with  . Fall  . Leg Pain       Brief Narrative   60 y.o. 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 [name] on 6/27    Subjective:    Wanda Griffin today has, No headache, No chest pain, No abdominal pain , Ports to right leg pain is improving.   Assessment  & Plan :    Principal Problem:   Femur fracture (HCC) Active Problems:   Seizure disorder (HCC)   Anxiety and depression   Tobacco abuse   Macrocytosis without anemia   Hyperlipidemia   Chronic constipation   COPD (chronic obstructive pulmonary disease) (HCC)   Femur fracture, right (HCC)   Femur fracture, right, closed, initial encounter   Proximal femur fracture, right  - Secondary to fall, orthopedic consult greatly appreciated, Status post surgical repair by Dr. [name] on 6/26. - Awaiting PT consult, but very likely will need SNF placement.  UTI - on ciprofloxacin, follow on urine culture  Anxiety and depression  - Continue current management with Seroquel and prn Xanax   Seizure disorder  - Likely secondary to remote brain tumor s/p resection  - Pt reports no recent seizure  - Continue current  management with Depakote   COPD  - Stable, no evidence of acute exacerbation  - Continue ICS/LABA with Dulera while in hospital  - DuoNeb q4h prn SOB or wheezing   Hyperlipidemia  - Continue Crestor   Tobacco abuse  - Nicotine patch available  - RN asked to provide smoking cessation information prior to discharge  Macrocytosis  - MCV 105.2 without anemia  - B 12 normal, but on the lower side, so will give supplement for 3 days IM injections, and discharged on by mouth supplement follow on folate   Code Status : Full  Family Communication  : none at bedside  Disposition Plan  : pending PT evaluation  , very likely will be able to discharge in a.m.  Consults  :  ortho  Procedures  : none  DVT Prophylaxis  :  lovenox- SCDs   Lab Results  Component Value Date   PLT 175 07/29/2015    Antibiotics  :    Anti-infectives    Start     Dose/Rate Route Frequency Ordered Stop   07/28/15 2200  clindamycin (CLEOCIN) IVPB 600 mg     600 mg 100 mL/hr over 30 Minutes Intravenous Every 6 hours 07/28/15 1952 07/29/15 0451   07/28/15 1530  clindamycin (CLEOCIN) IVPB 900 mg     900 mg 100 mL/hr over 30 Minutes Intravenous To [facility] 07/27/15 2245 07/28/15 1638   07/28/15 1245  ciprofloxacin (CIPRO) IVPB 400 mg     400 mg 200 mL/hr over 60 Minutes Intravenous Every 12 hours 07/28/15 1210          Objective:   Filed Vitals:   07/28/15 1930 07/28/15 2005 07/29/15 0000 07/29/15 0337  BP:  127/63 98/38 102/52  Pulse: 90 102 109 107  Temp: 97.9 F (36.6 C) 97.8 F (36.6 C)    TempSrc:  Oral    Resp: 11     SpO2: 100% 98% 91% 98%    Wt Readings from Last 3 Encounters:  05/23/12 46.584 kg (102 lb 11.2 oz)     Intake/Output Summary (Last 24 hours) at 07/29/15 1049 Last data filed at 07/29/15 0342  Gross per 24 hour  Intake   1450 ml  Output   1200 ml  Net    250 ml     Physical Exam  Awake Alert, Oriented X 3, . Supple Neck,No JVD,    Symmetrical Chest wall movement, Good air movement bilaterally, CTAB RRR,No Gallops,Rubs or new Murmurs, No Parasternal Heave +ve B.Sounds, Abd Soft, No tenderness, No Cyanosis, Clubbing or edema, No new Rash or bruise ,dressing C/D/I    Data Review:    CBC  Recent Labs Lab 07/27/15 1712 07/27/15 1723 07/28/15 0553 07/29/15 0528  WBC 7.2  --  7.4 7.7  HGB 12.4 14.6 11.7* 10.3*  HCT 40.2 43.0 37.5 32.2*  PLT 255  --  226 175  MCV 105.2*  --  104.2* 105.6*  MCH 32.5  --  32.5 33.8  MCHC 30.8  --  31.2 32.0  RDW 13.6  --  13.5 13.7  LYMPHSABS 1.7  --   --   --   MONOABS 0.4  --   --   --   EOSABS 0.1  --   --   --   BASOSABS 0.0  --   --   --     Chemistries   Recent Labs Lab 07/27/15 1712 07/27/15 1723 07/28/15 0553 07/29/15 0528  NA 139 141 138 138  K 3.9 3.8 4.0 4.1  CL 110 108 106 101  CO2 23  --  26 30  GLUCOSE 117* 114* 116* 119*  BUN 7 8 5* <5*  CREATININE 0.78 0.80 0.71 0.87  CALCIUM 9.1  --  9.4 9.1  AST 18  --   --   --   ALT 12*  --   --   --  ALKPHOS 78  --   --   --   BILITOT 0.1*  --   --   --    ------------------------------------------------------------------------------------------------------------------ No results for input(s): CHOL, HDL, LDLCALC, TRIG, CHOLHDL, LDLDIRECT in the last 72 hours.  No results found for: HGBA1C ------------------------------------------------------------------------------------------------------------------ No results for input(s): TSH, T4TOTAL, T3FREE, THYROIDAB in the last 72 hours.  Invalid input(s): FREET3 ------------------------------------------------------------------------------------------------------------------  Recent Labs  07/27/15 2259  VITAMINB12 355    Coagulation profile  Recent Labs Lab 07/27/15 1712  INR 0.98    No results for input(s): DDIMER in the last 72 hours.  Cardiac Enzymes No results for input(s): CKMB, TROPONINI, MYOGLOBIN in the last 168 hours.  Invalid  input(s): CK ------------------------------------------------------------------------------------------------------------------ No results found for: BNP  Inpatient Medications  Scheduled Meds: . ciprofloxacin  400 mg Intravenous Q12H  . cyanocobalamin  1,000 mcg Intramuscular Daily  . divalproex  500 mg Oral TID  . docusate sodium  100 mg Oral BID  . enoxaparin (LOVENOX) injection  40 mg Subcutaneous Q24H  . feeding supplement (ENSURE ENLIVE)  237 mL Oral BID BM  . lubiprostone  24 mcg Oral BID WC  . mometasone-formoterol  2 puff Inhalation BID  . QUEtiapine  25 mg Oral QHS  . rosuvastatin  20 mg Oral Daily  . senna  1 tablet Oral BID   Continuous Infusions: . lactated ringers 75 mL/hr at 07/28/15 2255   PRN Meds:.acetaminophen **OR** acetaminophen, ALPRAZolam, bisacodyl, HYDROcodone-acetaminophen, ipratropium-albuterol, magnesium citrate, menthol-cetylpyridinium **OR** phenol, metoCLOPramide **OR** metoCLOPramide (REGLAN) injection, morphine injection, nicotine, ondansetron **OR** ondansetron (ZOFRAN) IV, polyethylene glycol, sorbitol, zolpidem  Micro Results Recent Results (from the past 240 hour(s))  MRSA PCR Screening     Status: None   Collection Time: 07/27/15 11:19 PM  Result Value Ref Range Status   MRSA by PCR NEGATIVE NEGATIVE Final    Comment:        The GeneXpert MRSA Assay (FDA approved for NASAL specimens only), is one component of a comprehensive MRSA colonization surveillance program. It is not intended to diagnose MRSA infection nor to guide or monitor treatment for MRSA infections.     Radiology Reports Pelvis Portable  07/28/2015  CLINICAL DATA:  60 year old female that is post of right hip surgery. EXAM: PORTABLE PELVIS 1-2 VIEWS COMPARISON:  Intraoperative fluoroscopy stages 07/28/2015 FINDINGS: There has been interval placement of an intrauterine rod and transcervical screw in the right femur. The previously seen proximal femoral fracture is not  included in this image. No pelvic bone fracture identified. The bones are osteopenic. There is mild osteoarthritic changes of the hip joints. Air noted within the urinary bladder, likely introduced via catheterization. Soft tissue air noted in the right thigh related to recent surgery. IMPRESSION: Interval placement of right femoral orthopedic hardware. Electronically Signed   By: Anner Crete M.D.   On: 07/28/2015 20:37   Ct Femur Right Wo Contrast  07/27/2015  CLINICAL DATA:  60 year old female with right femur fracture. EXAM: CT OF THE RIGHT FEMUR WITHOUT CONTRAST TECHNIQUE: Multidetector CT imaging was performed according to the standard protocol. Multiplanar CT image reconstructions were also generated. COMPARISON:  Radiograph dated 07/27/2015 FINDINGS: There is a nondisplaced cortical fracture of the greater trochanter and intertrochanteric ridge. No definite femoral neck fracture identified, however evaluation is limited due to osteopenia. There is a posteriorly displaced comminuted oblique fracture of the proximal third and mid right femoral shaft. There is approximately 4 cm overlap of the fracture fragments. The fracture fragments demonstrate sharp changes without evidence  of underlying osseous lesion. There is no dislocation. No significant hematoma. IMPRESSION: Nondisplaced cortical fracture of the intertrochanteric ridge and a posteriorly dislocated, comminuted and oblique fracture of the right femoral diaphysis with approximately 4 cm overlap. Electronically Signed   By: Anner Crete M.D.   On: 07/27/2015 20:08   Dg Chest Portable 1 View  07/27/2015  CLINICAL DATA:  No chest complaints. Hx of COPD. Pt is a current smoker. Pt c/o proximal right femur pain after stepping into her car and hearing her leg pop. No hx of prior injuries. Best obtainable images due to pt condition. EXAM: PORTABLE CHEST 1 VIEW COMPARISON:  05/23/2012 FINDINGS: Normal cardiac silhouette. Vascular stent again noted  extending from the aortic arch. Cardiac recording device noted. Normal mediastinum and cardiac silhouette. Normal pulmonary vasculature. No evidence of effusion, infiltrate, or pneumothorax. No acute bony abnormality. IMPRESSION: No acute cardiopulmonary process. Electronically Signed   By: Suzy Bouchard M.D.   On: 07/27/2015 17:23   Dg Tibia/fibula Right Port  07/27/2015  CLINICAL DATA:  60 year old female with tibia fibula skin placement EXAM: PORTABLE RIGHT TIBIA AND FIBULA - 2 VIEW COMPARISON:  CT dated 07/27/2015 FINDINGS: Two views of the knee and proximal tibia and fibula demonstrate a fixation pin extending through the proximal tibial metadiaphysis through the fibular head. A stabilizing metallic rod is noted along the medial aspect of the knee and distal lower extremity. No acute fracture for dislocation of the visualized bones identified. There is no joint effusion. The soft tissues appear unremarkable. IMPRESSION: Stabilizing device along the medial aspect of the leg and a transfixing screw through the proximal tibia and fibula. Normal alignment. Electronically Signed   By: Anner Crete M.D.   On: 07/27/2015 21:01   Dg C-arm 1-60 Min  07/28/2015  CLINICAL DATA:  Right femoral nail. EXAM: DG C-ARM 61-120 MIN; RIGHT FEMUR 2 VIEWS COMPARISON:  Plain film of the right femur dated 07/27/2015. FINDINGS: Seven intraoperative fluoroscopic images are provided showing placement of an intra medullary rod. The intra medullary rod, femoral neck screw and distal fixation screw appear intact and appropriately positioned traversing the proximal femur fracture site. No evidence of surgical complicating feature. Fluoroscopy provided for the exam. IMPRESSION: Intraoperative films showing internal fixation of the proximal femur fracture. Hardware appears intact and appropriately positioned. Alignment significantly improved status post fixation. No evidence of surgical complicating feature. Electronically Signed    By: Franki Cabot M.D.   On: 07/28/2015 18:05   Dg Femur, Min 2 Views Right  07/28/2015  CLINICAL DATA:  Right femoral nail. EXAM: DG C-ARM 61-120 MIN; RIGHT FEMUR 2 VIEWS COMPARISON:  Plain film of the right femur dated 07/27/2015. FINDINGS: Seven intraoperative fluoroscopic images are provided showing placement of an intra medullary rod. The intra medullary rod, femoral neck screw and distal fixation screw appear intact and appropriately positioned traversing the proximal femur fracture site. No evidence of surgical complicating feature. Fluoroscopy provided for the exam. IMPRESSION: Intraoperative films showing internal fixation of the proximal femur fracture. Hardware appears intact and appropriately positioned. Alignment significantly improved status post fixation. No evidence of surgical complicating feature. Electronically Signed   By: Franki Cabot M.D.   On: 07/28/2015 18:05   Dg Femur Port, Min 2 Views Right  07/28/2015  CLINICAL DATA:  Postop check of right femur. EXAM: RIGHT FEMUR PORTABLE 1 VIEW COMPARISON:  Plain film of the right femur dated 07/27/2015 and intraoperative films from earlier today. FINDINGS: Patient is status post intra medullary rod fixation  of the proximal and mid right femur fractures. Hardware appears intact and appropriately positioned, traversing the right femur fracture sites. Osseous alignment is significantly improved. Expected postsurgical changes within the surrounding soft tissues. IMPRESSION: Status post intra medullary rod fixation of the right femur fractures. Hardware appears intact and appropriately positioned. No evidence of surgical complicating feature. Electronically Signed   By: Franki Cabot M.D.   On: 07/28/2015 19:23   Dg Femur Port, Min 2 Views Right  07/27/2015  CLINICAL DATA:  Right femur pain after stepping into her car today and hearing a pop EXAM: RIGHT FEMUR PORTABLE 1 VIEW COMPARISON:  None. FINDINGS: Four views of the right femur submitted.  There is oblique mild comminuted displaced fracture in proximal shaft of the right femur. There is at least 4 cm separation of bony fragments. No hip dislocation. IMPRESSION: Oblique mild comminuted displaced fracture proximal shaft of the right femur. Electronically Signed   By: Lahoma Crocker M.D.   On: 07/27/2015 17:25    Time Spent in minutes  25 minutes   Granvil Djordjevic M.D on 07/29/2015 at 10:49 AM  Between 7am to 7pm - Pager - [phone]  After 7pm go to [email or web address] - password [facility]  [facility] -  Office  [phone]

## 2015-07-29 NOTE — Progress Notes (Signed)
PT Cancellation Note  Patient Details Name: Wanda Griffin MRN: [id number] DOB: 1955-12-18   Cancelled Treatment:    Reason Eval/Treat Not Completed: Pain limiting ability to participate.  Attempted PT eval x2 this am with pt adamantly refusing any PT or mobility due to pain.  Will f/u another time.     Catarina Hartshorn, [place] 07/29/2015, 12:04 PM

## 2015-07-29 NOTE — Progress Notes (Signed)
   Subjective:  Patient reports pain as mild to moderate.  No events.  Objective:   VITALS:   Filed Vitals:   07/28/15 1930 07/28/15 2005 07/29/15 0000 07/29/15 0337  BP:  127/63 98/38 102/52  Pulse: 90 102 109 107  Temp: 97.9 F (36.6 C) 97.8 F (36.6 C)    TempSrc:  Oral    Resp: 11     SpO2: 100% 98% 91% 98%    NAD ABD soft Sensation intact distally Intact pulses distally Dorsiflexion/Plantar flexion intact Incision: dressing C/D/I Compartment soft   Lab Results  Component Value Date   WBC 7.7 07/29/2015   HGB 10.3* 07/29/2015   HCT 32.2* 07/29/2015   MCV 105.6* 07/29/2015   PLT 175 07/29/2015   BMET    Component Value Date/Time   NA 138 07/29/2015 0528   K 4.1 07/29/2015 0528   CL 101 07/29/2015 0528   CO2 30 07/29/2015 0528   GLUCOSE 119* 07/29/2015 0528   BUN <5* 07/29/2015 0528   CREATININE 0.87 07/29/2015 0528   CALCIUM 9.1 07/29/2015 0528   GFRNONAA >60 07/29/2015 0528   GFRAA >60 07/29/2015 0528     Assessment/Plan: 1 Day Post-Op   Principal Problem:   Femur fracture (HCC) Active Problems:   Seizure disorder (HCC)   Anxiety and depression   Tobacco abuse   Macrocytosis without anemia   Hyperlipidemia   Chronic constipation   COPD (chronic obstructive pulmonary disease) (HCC)   Femur fracture, right (HCC)   Femur fracture, right, closed, initial encounter    TDWB RLE with walker DVT ppx: lovenox x30 days PO pain control PT/OT Smoking cessation Dispo: SNF placement    Shruti Arrey, Horald Pollen 07/29/2015, 7:25 AM   Rod Can, MD Cell [phone]

## 2015-07-29 NOTE — Anesthesia Postprocedure Evaluation (Signed)
Anesthesia Post Note  Patient: MAREA DETERT  Procedure(s) Performed: Procedure(s) (LRB): REMOVAL OF TRACTION PIN INTRAMEDULLART (IM) NAIL FEMORAL (Right)  Patient location during evaluation: PACU Anesthesia Type: General Level of consciousness: awake and alert Pain management: pain level controlled Vital Signs Assessment: post-procedure vital signs reviewed and stable Respiratory status: spontaneous breathing, nonlabored ventilation and respiratory function stable Cardiovascular status: blood pressure returned to baseline and stable Postop Assessment: no signs of nausea or vomiting Anesthetic complications: no    Last Vitals:  Filed Vitals:   07/29/15 0000 07/29/15 0337  BP: 98/38 102/52  Pulse: 109 107  Temp:    Resp:      Last Pain:  Filed Vitals:   07/29/15 0629  PainSc: Asleep                 Anastashia Westerfeld A

## 2015-07-29 NOTE — Care Management Note (Signed)
Case Management Note  Patient Details  Name: Wanda Griffin MRN: [id number] Date of Birth: Aug 10, 1955  Subjective/Objective:  60 yr old female s/p IM Nailing of right femur.                  Action/Plan: Case manager spoke with patient concerning home health needs. Choice was offered for [facility]. Referral was called to Lajuan Lines, [facility] Liaison. Cm waiting for therapy to determine DME needs. Patient has refused to work with therapy. \  Expected Discharge Date:                  Expected Discharge Plan:   Home with Home Health  In-House Referral:     Discharge planning Services  CM Consult  Post Acute Care Choice:  Home Health Choice offered to:  Patient  DME Arranged:    DME Agency:     HH Arranged:  PT HH Agency:  [facility]  Status of Service:  In process, will continue to follow  If discussed at Long Length of Stay Meetings, dates discussed:    Additional Comments:  Ninfa Meeker, RN 07/29/2015, 2:33 PM

## 2015-07-30 ENCOUNTER — Encounter (HOSPITAL_COMMUNITY): Payer: Self-pay | Admitting: Orthopedic Surgery

## 2015-07-30 DIAGNOSIS — K5900 Constipation, unspecified: Secondary | ICD-10-CM

## 2015-07-30 DIAGNOSIS — E440 Moderate protein-calorie malnutrition: Secondary | ICD-10-CM | POA: Insufficient documentation

## 2015-07-30 LAB — BASIC METABOLIC PANEL
Anion gap: 9 (ref 5–15)
BUN: <5 mg/dL — ABNORMAL LOW (ref 6–20)
CALCIUM: 8.9 mg/dL (ref 8.9–10.3)
CO2: 28 mmol/L (ref 22–32)
CREATININE: 0.63 mg/dL (ref 0.44–1.00)
Chloride: 104 mmol/L (ref 101–111)
Glucose, Bld: 103 mg/dL — ABNORMAL HIGH (ref 65–99)
Potassium: 3.7 mmol/L (ref 3.5–5.1)
SODIUM: 141 mmol/L (ref 135–145)

## 2015-07-30 LAB — CBC
HCT: 29.3 % — ABNORMAL LOW (ref 36.0–46.0)
Hemoglobin: 9.2 g/dL — ABNORMAL LOW (ref 12.0–15.0)
MCH: 32.7 pg (ref 26.0–34.0)
MCHC: 31.4 g/dL (ref 30.0–36.0)
MCV: 104.3 fL — ABNORMAL HIGH (ref 78.0–100.0)
PLATELETS: 167 10*3/uL (ref 150–400)
RBC: 2.81 MIL/uL — ABNORMAL LOW (ref 3.87–5.11)
RDW: 13.6 % (ref 11.5–15.5)
WBC: 7.3 10*3/uL (ref 4.0–10.5)

## 2015-07-30 MED ORDER — MORPHINE SULFATE (PF) 2 MG/ML IV SOLN
1.0000 mg | INTRAVENOUS | Status: DC | PRN
  Administered 2015-07-30: 1 mg via INTRAVENOUS
  Filled 2015-07-30: qty 1

## 2015-07-30 NOTE — Progress Notes (Signed)
PROGRESS NOTE  Wanda Griffin [id number] DOB: Nov 23, 1955 DOA: 07/27/2015 PCP: [name], MD   LOS: 3 days   Brief Narrative: 60 y.o. 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 Delfino Lovett on 6/27  Assessment & Plan: Principal Problem:   Femur fracture ([place]) Active Problems:   Seizure disorder ([facility])   Anxiety and depression   Tobacco abuse   Macrocytosis without anemia   Hyperlipidemia   Chronic constipation   COPD (chronic obstructive pulmonary disease) (HCC)   Femur fracture, right (HCC)   Femur fracture, right, closed, initial encounter   Malnutrition of moderate degree   Proximal femur fracture, right  - Secondary to fall, orthopedic consult greatly appreciated, Status post surgical repair by Dr. [name] on 6/26. - PT consulted, recommending SNF, patient declines. Strongly advised today to reconsider.   UTI - on ciprofloxacin  Anxiety and depression  - Continue current management with Seroquel and prn Xanax   Seizure disorder  - Likely secondary to remote brain tumor s/p resection  - Pt reports no recent seizure  - Continue current management with Depakote   COPD  - Stable, no evidence of acute exacerbation  - Continue ICS/LABA with Dulera while in hospital  - DuoNeb q4h prn SOB or wheezing   Hyperlipidemia  - Continue Crestor   Tobacco abuse  - Nicotine patch available  - RN asked to provide smoking cessation information prior to discharge   Macrocytosis  - MCV 105.2 without anemia  - B 12 normal, but on the lower side, so will give supplement for 3 days IM injections, and discharged on by mouth  supplement follow on folate  DVT prophylaxis:  Lovenox Code Status: Full Family Communication: no family bedside Disposition Plan: home in 1 day if CBC stable  Consultants:   Orthopedic surgery   Procedures:   None   Antimicrobials:  Ciprofloxacin    Subjective: - no chest pain, shortness of breath, no abdominal pain, nausea or vomiting.  - complains of leg pain   Objective: Filed Vitals:   07/29/15 1634 07/29/15 1956 07/30/15 0550 07/30/15 1403  BP: 92/44 110/42 114/42 95/45  Pulse: 81 93 90 108  Temp: 97.1 F (36.2 C) 98.3 F (36.8 C) 98.3 F (36.8 C) 98.6 F (37 C)  TempSrc: Oral Oral Oral Oral  Resp: 15 15 15    SpO2: 98% 98% 98% 98%    Intake/Output Summary (Last 24 hours) at 07/30/15 1426 Last data filed at 07/30/15 0839  Gross per 24 hour  Intake    120 ml  Output    800 ml  Net   -680 ml   Examination: Constitutional: NAD Filed Vitals:   07/29/15 1634 07/29/15 1956 07/30/15 0550 07/30/15 1403  BP: 92/44 110/42 114/42 95/45  Pulse: 81 93 90 108  Temp: 97.1 F (36.2 C) 98.3 F (36.8 C) 98.3 F (36.8 C) 98.6 F (37 C)  TempSrc: Oral Oral Oral Oral  Resp: 15 15 15    SpO2: 98% 98% 98% 98%   Eyes: PERRL, lids and conjunctivae normal Respiratory: clear to auscultation bilaterally, no wheezing, no crackles. Normal respiratory effort.  Cardiovascular: Regular rate and rhythm, no murmurs / rubs / gallops. No LE edema. 2+ pedal pulses. No carotid bruits.  Abdomen: no tenderness.  Bowel sounds positive.  Musculoskeletal: no clubbing / cyanosis.  Neurologic: non focal    Data Reviewed: I have personally reviewed following labs and imaging studies  CBC:  Recent Labs Lab 07/27/15 1712 07/27/15 1723 07/27/15 2259 07/28/15 0553 07/29/15 0528 07/30/15 0340  WBC 7.2  --   --  7.4 7.7 7.3  NEUTROABS 5.0  --   --   --   --   --   HGB 12.4 14.6  --  11.7* 10.3* 9.2*  HCT 40.2 43.0 37.1 37.5 32.2* 29.3*  MCV 105.2*  --   --  104.2* 105.6* 104.3*  PLT 255  --   --  226 175 [id number]   Basic Metabolic  Panel:  Recent Labs Lab 07/27/15 1712 07/27/15 1723 07/28/15 0553 07/29/15 0528 07/30/15 0340  NA 139 141 138 138 141  K 3.9 3.8 4.0 4.1 3.7  CL 110 108 106 101 104  CO2 23  --  26 30 28   GLUCOSE 117* 114* 116* 119* 103*  BUN 7 8 5* <5* <5*  CREATININE 0.78 0.80 0.71 0.87 0.63  CALCIUM 9.1  --  9.4 9.1 8.9   GFR: CrCl cannot be calculated (Unknown ideal weight.). Liver Function Tests:  Recent Labs Lab 07/27/15 1712  AST 18  ALT 12*  ALKPHOS 78  BILITOT 0.1*  PROT 6.2*  ALBUMIN 3.4*   No results for input(s): LIPASE, AMYLASE in the last 168 hours. No results for input(s): AMMONIA in the last 168 hours. Coagulation Profile:  Recent Labs Lab 07/27/15 1712  INR 0.98   Cardiac Enzymes: No results for input(s): CKTOTAL, CKMB, CKMBINDEX, TROPONINI in the last 168 hours. BNP (last 3 results) No results for input(s): PROBNP in the last 8760 hours. HbA1C: No results for input(s): HGBA1C in the last 72 hours. CBG: No results for input(s): GLUCAP in the last 168 hours. Lipid Profile: No results for input(s): CHOL, HDL, LDLCALC, TRIG, CHOLHDL, LDLDIRECT in the last 72 hours. Thyroid Function Tests: No results for input(s): TSH, T4TOTAL, FREET4, T3FREE, THYROIDAB in the last 72 hours. Anemia Panel:  Recent Labs  07/27/15 2259  VITAMINB12 355   Urine analysis:    Component Value Date/Time   COLORURINE YELLOW 07/28/2015 1027   APPEARANCEUR CLEAR 07/28/2015 1027   LABSPEC 1.011 07/28/2015 1027   PHURINE 6.0 07/28/2015 1027   GLUCOSEU NEGATIVE 07/28/2015 1027   HGBUR TRACE* 07/28/2015 1027   Two Strike 07/28/2015 1027   KETONESUR NEGATIVE 07/28/2015 1027   PROTEINUR NEGATIVE 07/28/2015 1027   UROBILINOGEN 1.0 05/23/2012 1359   NITRITE NEGATIVE 07/28/2015 1027   LEUKOCYTESUR MODERATE* 07/28/2015 1027   Sepsis Labs: Invalid input(s): PROCALCITONIN, LACTICIDVEN  Recent Results (from the past 240 hour(s))  MRSA PCR Screening     Status: None    Collection Time: 07/27/15 11:19 PM  Result Value Ref Range Status   MRSA by PCR NEGATIVE NEGATIVE Final    Comment:        The GeneXpert MRSA Assay (FDA approved for NASAL specimens only), is one component of a comprehensive MRSA colonization surveillance program. It is not intended to diagnose MRSA infection nor to guide or monitor treatment for MRSA infections.   Urine culture     Status: None   Collection Time: 07/28/15 10:27 AM  Result Value Ref Range Status   Specimen Description URINE, CATHETERIZED  Final   Special Requests NONE  Final   Culture NO GROWTH  Final   Report Status 07/29/2015 FINAL  Final  Radiology Studies: Pelvis Portable  07/28/2015  CLINICAL DATA:  60 year old female that is post of right hip surgery. EXAM: PORTABLE PELVIS 1-2 VIEWS COMPARISON:  Intraoperative fluoroscopy stages 07/28/2015 FINDINGS: There has been interval placement of an intrauterine rod and transcervical screw in the right femur. The previously seen proximal femoral fracture is not included in this image. No pelvic bone fracture identified. The bones are osteopenic. There is mild osteoarthritic changes of the hip joints. Air noted within the urinary bladder, likely introduced via catheterization. Soft tissue air noted in the right thigh related to recent surgery. IMPRESSION: Interval placement of right femoral orthopedic hardware. Electronically Signed   By: Anner Crete M.D.   On: 07/28/2015 20:37   Dg C-arm 1-60 Min  07/28/2015  CLINICAL DATA:  Right femoral nail. EXAM: DG C-ARM 61-120 MIN; RIGHT FEMUR 2 VIEWS COMPARISON:  Plain film of the right femur dated 07/27/2015. FINDINGS: Seven intraoperative fluoroscopic images are provided showing placement of an intra medullary rod. The intra medullary rod, femoral neck screw and distal fixation screw appear intact and appropriately positioned traversing the proximal femur fracture site. No evidence of surgical complicating feature.  Fluoroscopy provided for the exam. IMPRESSION: Intraoperative films showing internal fixation of the proximal femur fracture. Hardware appears intact and appropriately positioned. Alignment significantly improved status post fixation. No evidence of surgical complicating feature. Electronically Signed   By: Franki Cabot M.D.   On: 07/28/2015 18:05   Dg Femur, Min 2 Views Right  07/28/2015  CLINICAL DATA:  Right femoral nail. EXAM: DG C-ARM 61-120 MIN; RIGHT FEMUR 2 VIEWS COMPARISON:  Plain film of the right femur dated 07/27/2015. FINDINGS: Seven intraoperative fluoroscopic images are provided showing placement of an intra medullary rod. The intra medullary rod, femoral neck screw and distal fixation screw appear intact and appropriately positioned traversing the proximal femur fracture site. No evidence of surgical complicating feature. Fluoroscopy provided for the exam. IMPRESSION: Intraoperative films showing internal fixation of the proximal femur fracture. Hardware appears intact and appropriately positioned. Alignment significantly improved status post fixation. No evidence of surgical complicating feature. Electronically Signed   By: Franki Cabot M.D.   On: 07/28/2015 18:05   Dg Femur Port, Min 2 Views Right  07/28/2015  CLINICAL DATA:  Postop check of right femur. EXAM: RIGHT FEMUR PORTABLE 1 VIEW COMPARISON:  Plain film of the right femur dated 07/27/2015 and intraoperative films from earlier today. FINDINGS: Patient is status post intra medullary rod fixation of the proximal and mid right femur fractures. Hardware appears intact and appropriately positioned, traversing the right femur fracture sites. Osseous alignment is significantly improved. Expected postsurgical changes within the surrounding soft tissues. IMPRESSION: Status post intra medullary rod fixation of the right femur fractures. Hardware appears intact and appropriately positioned. No evidence of surgical complicating feature.  Electronically Signed   By: Franki Cabot M.D.   On: 07/28/2015 19:23     Scheduled Meds: . ciprofloxacin  500 mg Oral BID  . cyanocobalamin  1,000 mcg Intramuscular Daily  . divalproex  500 mg Oral TID  . docusate sodium  100 mg Oral BID  . enoxaparin (LOVENOX) injection  40 mg Subcutaneous Q24H  . feeding supplement (ENSURE ENLIVE)  237 mL Oral BID BM  . lubiprostone  24 mcg Oral BID WC  . mometasone-formoterol  2 puff Inhalation BID  . QUEtiapine  25 mg Oral QHS  . rosuvastatin  20 mg Oral Daily  . senna  1 tablet Oral BID   Continuous Infusions:  Marzetta Board, MD, PhD [facility] Pager [phone]  If 7PM-7AM, please contact night-coverage [email or web address] Password [email or web address] 07/30/2015, 2:26 PM

## 2015-07-30 NOTE — Care Management Important Message (Signed)
Important Message  Patient Details  Name: Wanda Griffin MRN: [id number] Date of Birth: [birth year]   Medicare Important Message Given:  Yes    [name] 07/30/2015, 10:12 AM

## 2015-07-30 NOTE — Evaluation (Signed)
Physical Therapy Evaluation Patient Details Name: Wanda Griffin MRN: [id number] DOB: Jun 29, 1955 Today's Date: 07/30/2015   History of Present Illness  pt rpesents with R Femur fx s/p IM nail.  pt with hx of Brain Tumor Resection followed by SDH, Seizures, Anxiety, Depression, and COPD.    Clinical Impression  Pt at this time requiring extensive A for all aspects of mobility.  Pt needed max encouragement for any participation.  Pt with R sided weakness which she indicates is baseline from her tumor resection.  Pt truly needs SNF level of care and therapies, however at this time refusing.  Pt will need maximized [facility] Services if she decides to D/C to home with HHPT/OT/ Aide/RN and DME below with possibly a W/C.      Follow Up Recommendations SNF (pt refusing)    Equipment Recommendations  Rolling walker with 5" wheels;3in1 (PT)    Recommendations for Other Services       Precautions / Restrictions Precautions Precautions: Fall Restrictions Weight Bearing Restrictions: Yes RLE Weight Bearing: Partial weight bearing RLE Partial Weight Bearing Percentage or Pounds: 30 Other Position/Activity Restrictions: Educated pt on TDWBing for safety      Mobility  Bed Mobility Overal bed mobility: Needs Assistance;+2 for physical assistance Bed Mobility: Supine to Sit     Supine to sit: Max assist;+2 for physical assistance     General bed mobility comments: pt needs consistent cueing and facilitation for coming to sitting.    Transfers Overall transfer level: Needs assistance Equipment used: 2 person hand held assist Transfers: Sit to/from [facility] Sit to Stand: Max assist;+2 physical assistance Stand pivot transfers: Max assist;+2 physical assistance       General transfer comment: pt needs extensive A for mobility and maintaining WBing restrictions.    Ambulation/Gait                Stairs            Wheelchair Mobility    Modified Rankin  (Stroke Patients Only)       Balance Overall balance assessment: Needs assistance Sitting-balance support: Bilateral upper extremity supported;Feet supported Sitting balance-Leahy Scale: Poor Sitting balance - Comments: leans to R and posteriorly. Postural control: Posterior lean;Right lateral lean Standing balance support: During functional activity Standing balance-Leahy Scale: Zero                               Pertinent Vitals/Pain Pain Assessment: Faces Faces Pain Scale: Hurts whole lot Pain Location: R hip Pain Descriptors / Indicators: Aching;Crying;Grimacing;Guarding Pain Intervention(s): Monitored during session;Premedicated before session;Repositioned    Home Living Family/patient expects to be discharged to:: Private residence Living Arrangements: Spouse/significant other Available Help at Discharge: Family;Available 24 hours/day Type of Home: House Home Access: Stairs to enter Entrance Stairs-Rails: Psychiatric nurse of Steps: 2 or 3 Home Layout: One level Home Equipment: None Additional Comments: pt's spouse is unable to provide much A as he is old TBI.      Prior Function Level of Independence: Independent               Hand Dominance        Extremity/Trunk Assessment   Upper Extremity Assessment: Defer to OT evaluation           Lower Extremity Assessment: Generalized weakness;RLE deficits/detail RLE Deficits / Details: Unable to fully assess due to pain.      Cervical / Trunk Assessment: Kyphotic (R  lateral lean, but unclear if this is baseline.)  Communication   Communication: No difficulties  Cognition Arousal/Alertness: Awake/alert Behavior During Therapy: Anxious Overall Cognitive Status: No family/caregiver present to determine baseline cognitive functioning                      General Comments      Exercises        Assessment/Plan    PT Assessment Patient needs continued PT  services  PT Diagnosis Difficulty walking;Acute pain   PT Problem List Decreased strength;Decreased activity tolerance;Decreased balance;Decreased mobility;Decreased cognition;Decreased knowledge of use of DME;Decreased safety awareness;Pain  PT Treatment Interventions DME instruction;Gait training;Stair training;Functional mobility training;Therapeutic activities;Therapeutic exercise;Balance training;Patient/family education   PT Goals (Current goals can be found in the Care Plan section) Acute Rehab PT Goals Patient Stated Goal: Home PT Goal Formulation: With patient Time For Goal Achievement: 08/13/15 Potential to Achieve Goals: Good    Frequency Min 5X/week   Barriers to discharge Decreased caregiver support      Co-evaluation               End of Session Equipment Utilized During Treatment: Gait belt Activity Tolerance: Patient limited by pain Patient left: in chair;with call bell/phone within reach Nurse Communication: Mobility status         Time: [id number] PT Time Calculation (min) (ACUTE ONLY): 22 min   Charges:   PT Evaluation $PT Eval Moderate Complexity: 1 Procedure     PT G CodesCatarina Hartshorn, [place] [id number] 07/30/2015, 3:00 PM

## 2015-07-31 ENCOUNTER — Inpatient Hospital Stay (HOSPITAL_COMMUNITY): Payer: Medicare Other

## 2015-07-31 ENCOUNTER — Encounter (HOSPITAL_COMMUNITY): Payer: Self-pay | Admitting: General Practice

## 2015-07-31 LAB — BLOOD GAS, ARTERIAL
Acid-Base Excess: 9.6 mmol/L — ABNORMAL HIGH (ref 0.0–2.0)
Bicarbonate: 34.4 mEq/L — ABNORMAL HIGH (ref 20.0–24.0)
Drawn by: 281201
O2 Content: 1.5 L/min
O2 Saturation: 93.4 %
PCO2 ART: 54 mmHg — AB (ref 35.0–45.0)
PH ART: 7.421 (ref 7.350–7.450)
PO2 ART: 68.6 mmHg — AB (ref 80.0–100.0)
Patient temperature: 98.6
TCO2: 36.1 mmol/L (ref 0–100)

## 2015-07-31 LAB — BASIC METABOLIC PANEL
Anion gap: 9 (ref 5–15)
BUN: 13 mg/dL (ref 6–20)
CO2: 30 mmol/L (ref 22–32)
Calcium: 9.1 mg/dL (ref 8.9–10.3)
Chloride: 102 mmol/L (ref 101–111)
Creatinine, Ser: 0.71 mg/dL (ref 0.44–1.00)
GFR calc Af Amer: >60 mL/min (ref >60)
GLUCOSE: 82 mg/dL (ref 65–99)
POTASSIUM: 4.4 mmol/L (ref 3.5–5.1)
Sodium: 141 mmol/L (ref 135–145)

## 2015-07-31 LAB — CBC
HCT: 31.8 % — ABNORMAL LOW (ref 36.0–46.0)
Hemoglobin: 10 g/dL — ABNORMAL LOW (ref 12.0–15.0)
MCH: 33.8 pg (ref 26.0–34.0)
MCHC: 31.4 g/dL (ref 30.0–36.0)
MCV: 107.4 fL — AB (ref 78.0–100.0)
PLATELETS: 195 10*3/uL (ref 150–400)
RBC: 2.96 MIL/uL — AB (ref 3.87–5.11)
RDW: 13.7 % (ref 11.5–15.5)
WBC: 6.6 10*3/uL (ref 4.0–10.5)

## 2015-07-31 LAB — AMMONIA: Ammonia: 47 umol/L — ABNORMAL HIGH (ref 9–35)

## 2015-07-31 MED ORDER — OXYCODONE-ACETAMINOPHEN 5-325 MG PO TABS
1.0000 | ORAL_TABLET | Freq: Four times a day (QID) | ORAL | Status: DC | PRN
  Administered 2015-07-31 – 2015-08-03 (×4): 1 via ORAL
  Filled 2015-07-31 (×6): qty 1

## 2015-07-31 MED ORDER — LACTULOSE 10 GM/15ML PO SOLN
10.0000 g | Freq: Two times a day (BID) | ORAL | Status: DC
  Administered 2015-07-31 – 2015-08-02 (×4): 10 g via ORAL
  Filled 2015-07-31 (×4): qty 15

## 2015-07-31 MED ORDER — SODIUM CHLORIDE 0.9 % IV SOLN
INTRAVENOUS | Status: DC
  Administered 2015-07-31 – 2015-08-03 (×4): via INTRAVENOUS

## 2015-07-31 NOTE — Clinical Social Work Note (Signed)
Clinical Social Work Assessment  Patient Details  Name: Wanda Griffin MRN: [id number] Date of Birth: 25-Jan-1956  Date of referral:  07/31/15               Reason for consult:  Facility Placement, Discharge Planning                Permission sought to share information with:  Facility Sport and exercise psychologist, Family Supports Permission granted to share information::  Yes, Verbal Permission Granted  Name::        Agency::  [facility]  Relationship::     Contact Information:     Housing/Transportation Living arrangements for the past 2 months:  [place] of Information:  Patient Patient Interpreter Needed:  None Criminal Activity/Legal Involvement Pertinent to Current Situation/Hospitalization:  No - Comment as needed Significant Relationships:  Spouse, Parents Lives with:  Spouse Do you feel safe going back to the place where you live?  Yes Need for family participation in patient care:  No (Coment) (Patient able to make own decisions.)  Care giving concerns:  Patient expressed no concerns at this time.   Social Worker assessment / plan:  LCSW received referral stating patient now agreeable to SNF placement. LCSW to complete SNF search in close proximity to patient's home. LCSW to continue to follow and assist with discharge planning needs.  Employment status:  Unemployed Forensic scientist:  Medicare PT Recommendations:  [place] / Referral to community resources:  [place]  Patient/Family's Response to care:  Patient apprehensive to SNF placement, but understanding of PT recommendation.  Patient/Family's Understanding of and Emotional Response to Diagnosis, Current Treatment, and Prognosis:  Patient apprehensive to SNF placement, but understanding of PT recommendation.  Emotional Assessment Appearance:  Appears stated age Attitude/Demeanor/Rapport:  Other (Appropriate) Affect (typically observed):   Apprehensive, Pleasant Orientation:  Oriented to Self, Oriented to Place, Oriented to  Time, Oriented to Situation Alcohol / Substance use:  Not Applicable Psych involvement (Current and /or in the community):  No (Comment) (Not appropriate on this admission.)  Discharge Needs  Concerns to be addressed:  No discharge needs identified Readmission within the last 30 days:  No Current discharge risk:  None Barriers to Discharge:  No Barriers Identified   Caroline Sauger, LCSW 07/31/2015, 3:38 PM [phone]

## 2015-07-31 NOTE — NC FL2 (Signed)
[place] LEVEL OF CARE SCREENING TOOL     IDENTIFICATION  Patient Name: Wanda Griffin Birthdate: January 19, 1956 Sex: female Admission Date (Current Location): 07/27/2015  [facility] and [place] Number:  Herbalist and Address:  [facility] [place]. [facility], [place] [address], [place],  [address]      Provider Number: [id number]  Attending Physician Name and Address:  Caren Griffins, MD  Relative Name and Phone Number:       Current Level of Care: Hospital Recommended Level of Care: [place] Prior Approval Number:    Date Approved/Denied:   PASRR Number: [id number]  Discharge Plan: SNF    Current Diagnoses: Patient Active Problem List   Diagnosis Date Noted  . Malnutrition of moderate degree 07/30/2015  . Femur fracture, right, closed, initial encounter 07/28/2015  . Femur fracture ([place]) 07/27/2015  . Anxiety and depression 07/27/2015  . Tobacco abuse 07/27/2015  . Macrocytosis without anemia 07/27/2015  . Hyperlipidemia 07/27/2015  . Chronic constipation 07/27/2015  . COPD (chronic obstructive pulmonary disease) ([place]) 07/27/2015  . Femur fracture, right ([place]) 07/27/2015  . Altered mental status 05/24/2012  . Seizure disorder ([place]) 05/24/2012  . Syncope and collapse 05/24/2012  . Polysubstance (excluding opioids) dependence, daily use ([place]) 05/24/2012    Orientation RESPIRATION BLADDER Height & Weight     Self, Time, Situation, Place  O2 Continent Weight:   Height:     BEHAVIORAL SYMPTOMS/MOOD NEUROLOGICAL BOWEL NUTRITION STATUS      Continent Diet (Please see discharge summary.)  AMBULATORY STATUS COMMUNICATION OF NEEDS Skin    (Has not ambulated with PT) Verbally Surgical wounds                       Personal Care Assistance Level of Assistance  Bathing, Feeding, Dressing Bathing Assistance: Limited assistance Feeding assistance: Independent Dressing Assistance: Limited assistance      Functional Limitations Info             SPECIAL CARE FACTORS FREQUENCY  PT (By licensed PT), OT (By licensed OT)     PT Frequency: 5 OT Frequency: 5            Contractures      Additional Factors Info  Code Status, Allergies Code Status Info: FULL Allergies Info: Ampicillin, Asa, Colchicine, Toradol           Current Medications (07/31/2015):  This is the current hospital active medication list Current Facility-Administered Medications  Medication Dose Route Frequency Provider Last Rate Last Dose  . acetaminophen (TYLENOL) tablet 650 mg  650 mg Oral Q6H PRN Rod Can, MD       Or  . acetaminophen (TYLENOL) suppository 650 mg  650 mg Rectal Q6H PRN Rod Can, MD      . ALPRAZolam Duanne Moron) tablet 1 mg  1 mg Oral BID PRN [name], MD   1 mg at 07/29/15 1129  . bisacodyl (DULCOLAX) EC tablet 5 mg  5 mg Oral Daily PRN [name], MD      . ciprofloxacin (CIPRO) tablet 500 mg  500 mg Oral BID Jake Church Masters, RPH   500 mg at 07/31/15 0849  . divalproex (DEPAKOTE) DR tablet 500 mg  500 mg Oral TID [name], MD   500 mg at 07/31/15 0849  . docusate sodium (COLACE) capsule 100 mg  100 mg Oral BID Rod Can, MD   100 mg at 07/31/15 0848  .  enoxaparin (LOVENOX) injection 40 mg  40 mg Subcutaneous Q24H Rod Can, MD   40 mg at 07/31/15 0848  . feeding supplement (ENSURE ENLIVE) (ENSURE ENLIVE) liquid 237 mL  237 mL Oral BID BM Albertine Patricia, MD   237 mL at 07/30/15 1429  . ipratropium-albuterol (DUONEB) 0.5-2.5 (3) MG/3ML nebulizer solution 3 mL  3 mL Nebulization Q4H PRN [name], MD      . lubiprostone (AMITIZA) capsule 24 mcg  24 mcg Oral BID WC [name], MD   24 mcg at 07/31/15 1039  . magnesium citrate solution 1 Bottle  1 Bottle Oral Once PRN Rod Can, MD      . menthol-cetylpyridinium (CEPACOL) lozenge 3 mg  1 lozenge Oral PRN Rod Can, MD   3 mg at 07/28/15 2230   Or  . phenol (CHLORASEPTIC) mouth spray 1  spray  1 spray Mouth/Throat PRN Rod Can, MD      . metoCLOPramide (REGLAN) tablet 5-10 mg  5-10 mg Oral Q8H PRN Rod Can, MD       Or  . metoCLOPramide (REGLAN) injection 5-10 mg  5-10 mg Intravenous Q8H PRN Rod Can, MD      . mometasone-formoterol (DULERA) 200-5 MCG/ACT inhaler 2 puff  2 puff Inhalation BID [name], MD   2 puff at 07/28/15 1211  . nicotine (NICODERM CQ - dosed in mg/24 hours) patch 14 mg  14 mg Transdermal Daily PRN [name], MD      . ondansetron (ZOFRAN) tablet 4 mg  4 mg Oral Q6H PRN Rod Can, MD       Or  . ondansetron (ZOFRAN) injection 4 mg  4 mg Intravenous Q6H PRN Rod Can, MD      . oxyCODONE-acetaminophen (PERCOCET/ROXICET) 5-325 MG per tablet 1 tablet  1 tablet Oral Q6H PRN Costin Karlyne Greenspan, MD      . polyethylene glycol (MIRALAX / GLYCOLAX) packet 17 g  17 g Oral Daily PRN [name], MD      . QUEtiapine (SEROQUEL) tablet 25 mg  25 mg Oral QHS [name], MD   25 mg at 07/30/15 2218  . rosuvastatin (CRESTOR) tablet 20 mg  20 mg Oral Daily [name], MD   20 mg at 07/31/15 0848  . senna (SENOKOT) tablet 8.6 mg  1 tablet Oral BID Rod Can, MD   8.6 mg at 07/31/15 0849  . sorbitol 70 % solution 30 mL  30 mL Oral Daily PRN Rod Can, MD         Discharge Medications: Please see discharge summary for a list of discharge medications.  Relevant Imaging Results:  Relevant Lab Results:   Additional Information SSN: [id number]  Caroline Sauger, LCSW

## 2015-07-31 NOTE — H&P (Signed)
patient unable to complete history at this time

## 2015-07-31 NOTE — Clinical Social Work Placement (Signed)
   CLINICAL SOCIAL WORK PLACEMENT  NOTE  Date:  07/31/2015  Patient Details  Name: Wanda Griffin MRN: [id number] Date of Birth: November 14, 1955  Clinical Social Work is seeking post-discharge placement for this patient at the [place] level of care (*CSW will initial, date and re-position this form in  chart as items are completed):  Yes   Patient/family provided with [facility]'s list of facilities offering this level of care within the geographic area requested by the patient (or if unable, by the patient's family).  Yes   Patient/family informed of their freedom to choose among providers that offer the needed level of care, that participate in Medicare, Medicaid or managed care program needed by the patient, have an available bed and are willing to accept the patient.  Yes   Patient/family informed of 's ownership interest in [facility] and [facility], as well as of the fact that they are under no obligation to receive care at these facilities.  PASRR submitted to EDS on 07/31/15     PASRR number received on 07/31/15     Existing PASRR number confirmed on       FL2 transmitted to all facilities in geographic area requested by pt/family on 07/31/15     FL2 transmitted to all facilities within larger geographic area on       Patient informed that his/her managed care company has contracts with or will negotiate with certain facilities, including the following:            Patient/family informed of bed offers received.  Patient chooses bed at       Physician recommends and patient chooses bed at      Patient to be transferred to   on  .  Patient to be transferred to facility by       Patient family notified on   of transfer.  Name of family member notified:        PHYSICIAN Please sign FL2     Additional Comment:    _______________________________________________ Caroline Sauger, LCSW 07/31/2015,  12:12 PM

## 2015-07-31 NOTE — Evaluation (Signed)
Occupational Therapy Evaluation Patient Details Name: Wanda Griffin MRN: [id number] DOB: 1955/03/28 Today's Date: 07/31/2015    History of Present Illness pt rpesents with R Femur fx s/p IM nail.  pt with hx of Brain Tumor Resection followed by SDH, Seizures, Anxiety, Depression, and COPD.     Clinical Impression   Pt with limited ability to participate in session due to lethargy and pain. Requiring extensive assist for bed mobility and to sit at side of bed. Pt requires total assist for ADL. She demonstrates impaired cognition. Will follow acutely.   Follow Up Recommendations  SNF;Supervision/Assistance - 24 hour    Equipment Recommendations   (defer to next venue)    Recommendations for Other Services       Precautions / Restrictions Precautions Precautions: Fall Restrictions Weight Bearing Restrictions: Yes RLE Weight Bearing: Partial weight bearing RLE Partial Weight Bearing Percentage or Pounds: 30 Other Position/Activity Restrictions: Educated pt on TDWBing for safety      Mobility Bed Mobility Overal bed mobility: Needs Assistance Bed Mobility: Supine to Sit     Supine to sit: Mod assist;HOB elevated Sit to supine: Total assist   General bed mobility comments: assisted R LE and used bed pad to EOB  Transfers                 General transfer comment: pt refused, began crying     Balance Overall balance assessment: Needs assistance Sitting-balance support: Feet supported Sitting balance-Leahy Scale: Poor Sitting balance - Comments: posterior lean, propped on L UE                                    ADL Overall ADL's : Needs assistance/impaired                                       General ADL Comments: total assist, pt not able to remain alert     Vision     Perception     Praxis      Pertinent Vitals/Pain Pain Assessment: Faces Faces Pain Scale: Hurts whole lot Pain Location: R LE with  movement Pain Descriptors / Indicators: Crying;Grimacing;Guarding Pain Intervention(s): Limited activity within patient's tolerance;Monitored during session;Repositioned     Hand Dominance Left   Extremity/Trunk Assessment Upper Extremity Assessment Upper Extremity Assessment: RUE deficits/detail;LUE deficits/detail RUE Deficits / Details: limited use at baseline RUE Coordination: decreased fine motor;decreased gross motor LUE Deficits / Details: generalized weakness   Lower Extremity Assessment Lower Extremity Assessment: Defer to PT evaluation       Communication Communication Communication: No difficulties   Cognition Arousal/Alertness: Lethargic Behavior During Therapy: Flat affect Overall Cognitive Status: Impaired/Different from baseline Area of Impairment: Orientation;Attention;Memory;Following commands;Safety/judgement;Problem solving Orientation Level: Disoriented to;Place;Time Current Attention Level: Sustained Memory: Decreased short-term memory;Decreased recall of precautions Following Commands: Follows one step commands inconsistently;Follows one step commands with increased time (and multimodal cues) Safety/Judgement: Decreased awareness of safety;Decreased awareness of deficits   Problem Solving: Slow processing;Decreased initiation;Difficulty sequencing;Requires verbal cues;Requires tactile cues     General Comments       Exercises       Shoulder Instructions      Home Living Family/patient expects to be discharged to:: Private residence Living Arrangements: Spouse/significant other Available Help at Discharge: Family;Available 24 hours/day Type of Home: House Home Access: Stairs to enter Entrance  Stairs-Number of Steps: 2 or 3 Entrance Stairs-Rails: Right;Left Home Layout: One level               Home Equipment: None   Additional Comments: pt's spouse is unable to provide much A as he is old TBI.        Prior Functioning/Environment  Level of Independence: Independent             OT Diagnosis: Generalized weakness;Cognitive deficits;Acute pain;Hemiplegia dominant side   OT Problem List: Decreased strength;Decreased range of motion;Decreased activity tolerance;Impaired balance (sitting and/or standing);Decreased coordination;Decreased cognition;Decreased safety awareness;Decreased knowledge of use of DME or AE;Impaired UE functional use;Pain   OT Treatment/Interventions: Self-care/ADL training;DME and/or AE instruction;Therapeutic activities;Patient/family education;Balance training;Cognitive remediation/compensation    OT Goals(Current goals can be found in the care plan section) Acute Rehab OT Goals Patient Stated Goal: Home OT Goal Formulation: With patient Time For Goal Achievement: 08/14/15 Potential to Achieve Goals: Fair ADL Goals Pt Will Perform Eating: with set-up;sitting Pt Will Perform Grooming: with set-up;sitting Pt Will Perform Upper Body Bathing: sitting;with min assist Pt Will Perform Upper Body Dressing: sitting;with min assist Pt Will Transfer to Toilet: with mod assist;stand pivot transfer;bedside commode (maintaining PWB on R LE) Pt Will Perform Toileting - Clothing Manipulation and hygiene: with mod assist;sitting/lateral leans Additional ADL Goal #1: Pt will perform bed mobility with min assist in preparation for ADL a EOB. Additional ADL Goal #2: Pt will follow 1 step directions consistently.  OT Frequency: Min 2X/week   Barriers to D/C: Decreased caregiver support          Co-evaluation              End of Session Equipment Utilized During Treatment: Oxygen  Activity Tolerance: Patient limited by lethargy;Patient limited by pain Patient left: in bed;with call bell/phone within reach;with bed alarm set   Time: 1200-1224 OT Time Calculation (min): 24 min Charges:  OT General Charges $OT Visit: 1 Procedure OT Evaluation $OT Eval Moderate Complexity: 1 Procedure OT  Treatments $Therapeutic Activity: 8-22 mins G-Codes:    Malka So 07/31/2015, 1:28 PM  [phone]

## 2015-07-31 NOTE — Progress Notes (Signed)
PROGRESS NOTE  Wanda Griffin [id number] DOB: [birth year] DOA: 07/27/2015 PCP: [name], MD   LOS: 4 days   Brief Narrative: 60 y.o. 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 Delfino Lovett on 6/27  Assessment & Plan: Principal Problem:   Femur fracture ([place]) Active Problems:   Seizure disorder ([place])   Anxiety and depression   Tobacco abuse   Macrocytosis without anemia   Hyperlipidemia   Chronic constipation   COPD (chronic obstructive pulmonary disease) ([place])   Femur fracture, right (HCC)   Femur fracture, right, closed, initial encounter   Malnutrition of moderate degree   Proximal femur fracture, right  - Secondary to fall, orthopedic consult greatly appreciated, Status post surgical repair by Dr. Lyla Glassing on 6/26. - PT consulted, recommending SNF, patient now agreeable. Strongly advised today to reconsider.   Acute encephalopathy / lethargy - patient more lethargic today, sleeping most of the day with little to no po intake - minimize narcotics - ABG given known COPD to r/o hypercarbia - ammonia level - CT brain given history of brain tumor and SDH  UTI - on ciprofloxacin  Anxiety and depression  - Continue current management with Seroquel and prn Xanax   Seizure disorder  - Likely secondary to remote brain tumor s/p resection  - Pt reports no recent seizure  - Continue current management with Depakote   COPD  - ABG pending - Continue ICS/LABA with Dulera while in hospital  - DuoNeb q4h prn SOB or wheezing   Hyperlipidemia  - Continue Crestor   Tobacco abuse  - Nicotine patch available  - RN asked to provide smoking cessation information prior to  discharge   Macrocytosis  - MCV 105.2 without anemia  - B 12 normal, but on the lower side, so will give supplement for 3 days IM injections, and discharged on by mouth  supplement follow on folate   DVT prophylaxis: Lovenox Code Status: Full Family Communication: no family bedside Disposition Plan: SNF once encephalopathy improves  Consultants:   Orthopedic surgery   Procedures:   None   Antimicrobials:  Ciprofloxacin    Subjective: - sleeping, wakes up easily but drifts back asleep during interview. Complains of leg pain  Objective: Filed Vitals:   07/30/15 1403 07/30/15 1948 07/31/15 0615 07/31/15 1300  BP: 95/45 116/98 116/85 100/62  Pulse: 108 121 120 109  Temp: 98.6 F (37 C) 99.7 F (37.6 C) 99.3 F (37.4 C) 98.5 F (36.9 C)  TempSrc: Oral Oral Oral Oral  Resp:  15 15 16   SpO2: 98% 96%  99%    Intake/Output Summary (Last 24 hours) at 07/31/15 1500 Last data filed at 07/31/15 1300  Gross per 24 hour  Intake    240 ml  Output      0 ml  Net    240 ml   Examination: Constitutional: lethargic   Filed Vitals:   07/30/15 1403 07/30/15 1948 07/31/15 0615 07/31/15 1300  BP: 95/45 116/98 116/85 100/62  Pulse: 108 121 120 109  Temp: 98.6 F (37 C) 99.7 F (37.6 C) 99.3 F (37.4 C) 98.5 F (36.9 C)  TempSrc: Oral Oral Oral Oral  Resp:  15 15 16   SpO2: 98% 96%  99%   Eyes: PERRL, lids and  conjunctivae normal Respiratory: clear to auscultation bilaterally, no wheezing, no crackles. Normal respiratory effort.  Cardiovascular: Regular rate and rhythm, no murmurs / rubs / gallops. No LE edema. 2+ pedal pulses. Abdomen: no tenderness. Bowel sounds positive.  Musculoskeletal: no clubbing / cyanosis.  Neurologic: non focal    Data Reviewed: I have personally reviewed following labs and imaging studies  CBC:  Recent Labs Lab 07/27/15 1712 07/27/15 1723 07/27/15 2259 07/28/15 0553 07/29/15 0528 07/30/15 0340 07/31/15 0317  WBC 7.2  --   --   7.4 7.7 7.3 6.6  NEUTROABS 5.0  --   --   --   --   --   --   HGB 12.4 14.6  --  11.7* 10.3* 9.2* 10.0*  HCT 40.2 43.0 37.1 37.5 32.2* 29.3* 31.8*  MCV 105.2*  --   --  104.2* 105.6* 104.3* 107.4*  PLT 255  --   --  226 175 167 0000000   Basic Metabolic Panel:  Recent Labs Lab 07/27/15 1712 07/27/15 1723 07/28/15 0553 07/29/15 0528 07/30/15 0340 07/31/15 0317  NA 139 141 138 138 141 141  K 3.9 3.8 4.0 4.1 3.7 4.4  CL 110 108 106 101 104 102  CO2 23  --  26 30 28 30   GLUCOSE 117* 114* 116* 119* 103* 82  BUN 7 8 5* <5* <5* 13  CREATININE 0.78 0.80 0.71 0.87 0.63 0.71  CALCIUM 9.1  --  9.4 9.1 8.9 9.1   GFR: CrCl cannot be calculated (Unknown ideal weight.). Liver Function Tests:  Recent Labs Lab 07/27/15 1712  AST 18  ALT 12*  ALKPHOS 78  BILITOT 0.1*  PROT 6.2*  ALBUMIN 3.4*   No results for input(s): LIPASE, AMYLASE in the last 168 hours. No results for input(s): AMMONIA in the last 168 hours. Coagulation Profile:  Recent Labs Lab 07/27/15 1712  INR 0.98   Cardiac Enzymes: No results for input(s): CKTOTAL, CKMB, CKMBINDEX, TROPONINI in the last 168 hours. BNP (last 3 results) No results for input(s): PROBNP in the last 8760 hours. HbA1C: No results for input(s): HGBA1C in the last 72 hours. CBG: No results for input(s): GLUCAP in the last 168 hours. Lipid Profile: No results for input(s): CHOL, HDL, LDLCALC, TRIG, CHOLHDL, LDLDIRECT in the last 72 hours. Thyroid Function Tests: No results for input(s): TSH, T4TOTAL, FREET4, T3FREE, THYROIDAB in the last 72 hours. Anemia Panel: No results for input(s): VITAMINB12, FOLATE, FERRITIN, TIBC, IRON, RETICCTPCT in the last 72 hours. Urine analysis:    Component Value Date/Time   COLORURINE YELLOW 07/28/2015 1027   APPEARANCEUR CLEAR 07/28/2015 1027   LABSPEC 1.011 07/28/2015 1027   PHURINE 6.0 07/28/2015 1027   GLUCOSEU NEGATIVE 07/28/2015 1027   HGBUR TRACE* 07/28/2015 1027   [place]  07/28/2015 1027   KETONESUR NEGATIVE 07/28/2015 1027   PROTEINUR NEGATIVE 07/28/2015 1027   UROBILINOGEN 1.0 05/23/2012 1359   NITRITE NEGATIVE 07/28/2015 1027   LEUKOCYTESUR MODERATE* 07/28/2015 1027   Sepsis Labs: Invalid input(s): PROCALCITONIN, LACTICIDVEN  Recent Results (from the past 240 hour(s))  MRSA PCR Screening     Status: None   Collection Time: 07/27/15 11:19 PM  Result Value Ref Range Status   MRSA by PCR NEGATIVE NEGATIVE Final    Comment:        The GeneXpert MRSA Assay (FDA approved for NASAL specimens only), is one component of a comprehensive MRSA colonization surveillance program. It is not intended to diagnose MRSA infection nor to guide or monitor treatment  for MRSA infections.   Urine culture     Status: None   Collection Time: 07/28/15 10:27 AM  Result Value Ref Range Status   Specimen Description URINE, CATHETERIZED  Final   Special Requests NONE  Final   Culture NO GROWTH  Final   Report Status 07/29/2015 FINAL  Final      Radiology Studies: No results found.   Scheduled Meds: . ciprofloxacin  500 mg Oral BID  . divalproex  500 mg Oral TID  . docusate sodium  100 mg Oral BID  . enoxaparin (LOVENOX) injection  40 mg Subcutaneous Q24H  . feeding supplement (ENSURE ENLIVE)  237 mL Oral BID BM  . lubiprostone  24 mcg Oral BID WC  . mometasone-formoterol  2 puff Inhalation BID  . QUEtiapine  25 mg Oral QHS  . rosuvastatin  20 mg Oral Daily  . senna  1 tablet Oral BID   Continuous Infusions: . sodium chloride       Marzetta Board, MD, PhD [facility] Pager [phone]  If 7PM-7AM, please contact night-coverage [email or web address] Password [email or web address] 07/31/2015, 3:00 PM

## 2015-07-31 NOTE — Progress Notes (Signed)
Physical Therapy Treatment Patient Details Name: Wanda Griffin MRN: [id number] DOB: 1955/04/17 Today's Date: 07/31/2015    History of Present Illness pt rpesents with R Femur fx s/p IM nail.  pt with hx of Brain Tumor Resection followed by SDH, Seizures, Anxiety, Depression, and COPD.      PT Comments    Pt very lethargic this am and not able to participate in mobility or PT.  RN made aware in lethargy and that this is a change from yesterdays session with PT.  Continue to feel pt is not safe for a D/C to home and should D/C to SNF level of care.    Follow Up Recommendations  SNF (pt refusing)     Equipment Recommendations  Rolling walker with 5" wheels;3in1 (PT);Wheelchair (measurements PT);Wheelchair cushion (measurements PT)    Recommendations for Other Services       Precautions / Restrictions Precautions Precautions: Fall Restrictions Weight Bearing Restrictions: Yes RLE Weight Bearing: Partial weight bearing RLE Partial Weight Bearing Percentage or Pounds: 30 Other Position/Activity Restrictions: Educated pt on TDWBing for safety    Mobility  Bed Mobility Overal bed mobility: Needs Assistance;+2 for physical assistance Bed Mobility: Supine to Sit;Sit to Supine     Supine to sit: Total assist;+2 for physical assistance;HOB elevated Sit to supine: Total assist;+2 for physical assistance   General bed mobility comments: pt very lethargic today requiring total A for all aspects of mobility.  No participation today.    Transfers                    Ambulation/Gait                 Stairs            Wheelchair Mobility    Modified Rankin (Stroke Patients Only)       Balance Overall balance assessment: Needs assistance Sitting-balance support: Feet unsupported Sitting balance-Leahy Scale: Zero Sitting balance - Comments: No effort to maintain balance.                              Cognition Arousal/Alertness:  Lethargic Behavior During Therapy: Flat affect Overall Cognitive Status: No family/caregiver present to determine baseline cognitive functioning                      Exercises      General Comments        Pertinent Vitals/Pain Pain Assessment: Faces Faces Pain Scale: Hurts a little bit Pain Location: R hip during mobility, otherwise lethargic. Pain Descriptors / Indicators: Grimacing;Guarding Pain Intervention(s): Monitored during session;Premedicated before session;Repositioned    Home Living                      Prior Function            PT Goals (current goals can now be found in the care plan section) Acute Rehab PT Goals Patient Stated Goal: Home PT Goal Formulation: With patient Time For Goal Achievement: 08/13/15 Potential to Achieve Goals: Good Progress towards PT goals: Not progressing toward goals - comment (Lethargy)    Frequency  Min 5X/week    PT Plan Current plan remains appropriate    Co-evaluation             End of Session Equipment Utilized During Treatment: Oxygen Activity Tolerance: Patient limited by lethargy Patient left: in bed;with call bell/phone within reach;with nursing/sitter in  room     Time: [id number] PT Time Calculation (min) (ACUTE ONLY): 27 min  Charges:  $Therapeutic Activity: 23-37 mins                    G CodesCatarina Hartshorn, [place] [id number] 07/31/2015, 9:03 AM

## 2015-08-01 ENCOUNTER — Inpatient Hospital Stay (HOSPITAL_COMMUNITY): Payer: Medicare Other

## 2015-08-01 ENCOUNTER — Encounter (HOSPITAL_COMMUNITY): Payer: Self-pay | Admitting: Neurology

## 2015-08-01 DIAGNOSIS — I638 Other cerebral infarction: Secondary | ICD-10-CM

## 2015-08-01 DIAGNOSIS — I639 Cerebral infarction, unspecified: Secondary | ICD-10-CM

## 2015-08-01 DIAGNOSIS — F329 Major depressive disorder, single episode, unspecified: Secondary | ICD-10-CM

## 2015-08-01 DIAGNOSIS — F418 Other specified anxiety disorders: Secondary | ICD-10-CM

## 2015-08-01 DIAGNOSIS — F0631 Mood disorder due to known physiological condition with depressive features: Secondary | ICD-10-CM | POA: Insufficient documentation

## 2015-08-01 LAB — BASIC METABOLIC PANEL
Anion gap: 9 (ref 5–15)
BUN: 14 mg/dL (ref 6–20)
CHLORIDE: 105 mmol/L (ref 101–111)
CO2: 27 mmol/L (ref 22–32)
Calcium: 8.9 mg/dL (ref 8.9–10.3)
Creatinine, Ser: 0.68 mg/dL (ref 0.44–1.00)
GFR calc Af Amer: >60 mL/min (ref >60)
GLUCOSE: 78 mg/dL (ref 65–99)
POTASSIUM: 4 mmol/L (ref 3.5–5.1)
Sodium: 141 mmol/L (ref 135–145)

## 2015-08-01 LAB — CBC
HEMATOCRIT: 30.3 % — AB (ref 36.0–46.0)
Hemoglobin: 9.3 g/dL — ABNORMAL LOW (ref 12.0–15.0)
MCH: 32.9 pg (ref 26.0–34.0)
MCHC: 30.7 g/dL (ref 30.0–36.0)
MCV: 107.1 fL — AB (ref 78.0–100.0)
PLATELETS: 211 10*3/uL (ref 150–400)
RBC: 2.83 MIL/uL — AB (ref 3.87–5.11)
RDW: 13.3 % (ref 11.5–15.5)
WBC: 5.3 10*3/uL (ref 4.0–10.5)

## 2015-08-01 MED ORDER — QUETIAPINE FUMARATE 25 MG PO TABS
25.0000 mg | ORAL_TABLET | Freq: Two times a day (BID) | ORAL | Status: DC
  Administered 2015-08-01 – 2015-08-04 (×6): 25 mg via ORAL
  Filled 2015-08-01 (×7): qty 1

## 2015-08-01 NOTE — Care Management Important Message (Signed)
Important Message  Patient Details  Name: JOSABETH TANNERY MRN: [id number] Date of Birth: 1955-10-25   Medicare Important Message Given:  Yes    Roneka Gilpin [place] 08/01/2015, 12:12 PM

## 2015-08-01 NOTE — Consult Note (Signed)
NEURO HOSPITALIST CONSULT NOTE   Requestig physician: Dr. Letta Median   Reason for Consult: AMS  History obtained from:  Patient   And chart  HPI:                                                                                                                                          Wanda Griffin is an 60 y.o. 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 Delfino Lovett on 6/27. After surgery she was noted to possibly have right sided weakness. CT scan was notable for possible watershed infarct. Patient initially refused MRI brain. This will be reattempted. On consultation she is very depressed and labile, crying when asked the date and year. She quickly responds to questions with "I do not know, and please do not ask me". She refuses to take part in both exam and consult questions.   Past Medical History  Diagnosis Date  . Subdural hematoma ([place]) 2012    After tumor resection  . Brain tumor ([place]) 2012    Resected, had subsequent subdural hematoma  . Mental health disorder   . IBS (irritable bowel syndrome)     chronic constipation  . COPD (chronic obstructive pulmonary disease) ([facility])     FEV1 36% predicted  . 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 Tobacco abuse   . Benzodiazepine abuse   . Altered mental status     /notes "has episodes ~ q month/family" (05/23/2012)  . Anxiety   . Depression   . Seizures [facility])     Past Surgical History  Procedure Laterality Date  . Brain surgery  01/2012    Tumor resection  . Loop recorder implant    . Subdural hematoma evacuation via craniotomy Left 2009  . Subdural hematoma evacuation via craniotomy Right 2014  . Tubal  ligation    . Vaginal hysterectomy    . Dilation and curettage of uterus    . Cardiac catheterization    . Coronary angioplasty with stent placement  ~ 2009    "1" (05/23/2012)  . Femur im nail Right 07/28/2015    Procedure: REMOVAL OF TRACTION PIN INTRAMEDULLART (IM) NAIL FEMORAL;  Surgeon: Rod Can, MD;  Location: [facility];  Service: Orthopedics;  Laterality: Right;    Family History  Problem Relation Age of Onset  . Hypertension Mother   . Hyperlipidemia Mother       Social History:  reports that she has been smoking Cigarettes.  She has a 10 pack-year smoking history. She  has never used smokeless tobacco. She reports that she uses illicit drugs (Marijuana). She reports that she does not drink alcohol.  Allergies  Allergen Reactions  . Ampicillin Hives  . Asa [Aspirin] Hives  . Colchicine     unknown  . Toradol [Ketorolac Tromethamine] Hives    MEDICATIONS:                                                                                                                     Prior to Admission:  Prescriptions prior to admission  Medication Sig Dispense Refill Last Dose  . acetaminophen (TYLENOL) 500 MG tablet Take 500 mg by mouth every 6 (six) hours as needed for mild pain.   Past Week at Unknown time  . ALPRAZolam (XANAX) 1 MG tablet Take 1 mg by mouth 2 (two) times daily as needed for anxiety.    05/22/2012 at Unknown  . budesonide-formoterol (SYMBICORT) 160-4.5 MCG/ACT inhaler Inhale 2 puffs into the lungs 2 (two) times daily.   05/22/2012 at Unknown  . divalproex (DEPAKOTE) 500 MG DR tablet Take 500 mg by mouth 3 (three) times daily.   05/22/2012 at Unknown  . lubiprostone (AMITIZA) 24 MCG capsule Take 24 mcg by mouth 2 (two) times daily with a meal.   05/22/2012 at Unknown  . QUEtiapine (SEROQUEL) 25 MG tablet Take 25 mg by mouth at bedtime.   05/22/2012 at Unknown  . rosuvastatin (CRESTOR) 20 MG tablet Take 20 mg by mouth daily.   05/22/2012 at Unknown   Scheduled: .  ciprofloxacin  500 mg Oral BID  . divalproex  500 mg Oral TID  . docusate sodium  100 mg Oral BID  . enoxaparin (LOVENOX) injection  40 mg Subcutaneous Q24H  . feeding supplement (ENSURE ENLIVE)  237 mL Oral BID BM  . lactulose  10 g Oral BID  . lubiprostone  24 mcg Oral BID WC  . mometasone-formoterol  2 puff Inhalation BID  . QUEtiapine  25 mg Oral QHS  . rosuvastatin  20 mg Oral Daily  . senna  1 tablet Oral BID     ROS:                                                                                                                                       History obtained from the patient  General ROS: negative for - chills, fatigue, fever, night sweats, weight gain or  weight loss Psychological ROS: negative for - behavioral disorder, hallucinations, memory difficulties, mood swings or suicidal ideation Ophthalmic ROS: negative for - blurry vision, double vision, eye pain or loss of vision ENT ROS: negative for - epistaxis, nasal discharge, oral lesions, sore throat, tinnitus or vertigo Allergy and Immunology ROS: negative for - hives or itchy/watery eyes Hematological and Lymphatic ROS: negative for - bleeding problems, bruising or swollen lymph nodes Endocrine ROS: negative for - galactorrhea, hair pattern changes, polydipsia/polyuria or temperature intolerance Respiratory ROS: negative for - cough, hemoptysis, shortness of breath or wheezing Cardiovascular ROS: negative for - chest pain, dyspnea on exertion, edema or irregular heartbeat Gastrointestinal ROS: negative for - abdominal pain, diarrhea, hematemesis, nausea/vomiting or stool incontinence Genito-Urinary ROS: negative for - dysuria, hematuria, incontinence or urinary frequency/urgency Musculoskeletal ROS: negative for - joint swelling or muscular weakness Neurological ROS: as noted in HPI Dermatological ROS: negative for rash and skin lesion changes   Blood pressure 128/51, pulse 102, temperature 98.6 F (37 C),  temperature source Oral, resp. rate 16, SpO2 93 %.   Neurologic Examination:                                                                                                      HEENT-  Normocephalic, no lesions, without obvious abnormality.  Normal external eye and conjunctiva.  Normal TM's bilaterally.  Normal auditory canals and external ears. Normal external nose, mucus membranes and septum.  Normal pharynx. Cardiovascular- S1, S2 normal, pulses palpable throughout   Lungs- chest clear, no wheezing, rales, normal symmetric air entry Abdomen- normal findings: bowel sounds normal Extremities- no edema Lymph-no adenopathy palpable Musculoskeletal-no joint tenderness, deformity or swelling Skin-warm and dry, no hyperpigmentation, vitiligo, or suspicious lesions  Neurological Examination Mental Status: Alert, oriented to hospital, June and year, labile and depressed mood.  Speech fluent without evidence of aphasia.  Able to follow simple commands if desires to. Cranial Nerves: II:  Visual fields grossly normal, pupils equal, round, reactive to light and accommodation III,IV, VI: ptosis not present, extra-ocular motions intact bilaterally V,VII: smile symmetric, facial light touch sensation normal bilaterally VIII: hearing normal bilaterally IX,X: uvula rises symmetrically XI: bilateral shoulder shrug XII: midline tongue extension Motor: Right : Upper extremity   Refused to move due to pain    Left:     Upper extremity   5/5  Lower extremity   Post surgical     Lower extremity   5/5 Tone and bulk:normal tone throughout; no atrophy noted Sensory: Pinprick and light touch intact throughout, bilaterally Deep Tendon Reflexes: 1+ and symmetric throughout Plantars: Right: downgoing   Left: downgoing Cerebellar: Would not take part in Exam Gait: not tested      Lab Results: Basic Metabolic Panel:  Recent Labs Lab 07/28/15 0553 07/29/15 0528 07/30/15 0340 07/31/15 0317  08/01/15 0744  NA 138 138 141 141 141  K 4.0 4.1 3.7 4.4 4.0  CL 106 101 104 102 105  CO2 26 30 28 30 27   GLUCOSE 116* 119* 103* 82 78  BUN 5* <5* <5* 13 14  CREATININE 0.71 0.87 0.63 0.71 0.68  CALCIUM 9.4 9.1 8.9 9.1 8.9    Liver Function Tests:  Recent Labs Lab 07/27/15 1712  AST 18  ALT 12*  ALKPHOS 78  BILITOT 0.1*  PROT 6.2*  ALBUMIN 3.4*   No results for input(s): LIPASE, AMYLASE in the last 168 hours.  Recent Labs Lab 07/31/15 1540  AMMONIA 47*    CBC:  Recent Labs Lab 07/27/15 1712  07/28/15 0553 07/29/15 0528 07/30/15 0340 07/31/15 0317 08/01/15 0744  WBC 7.2  --  7.4 7.7 7.3 6.6 5.3  NEUTROABS 5.0  --   --   --   --   --   --   HGB 12.4  < > 11.7* 10.3* 9.2* 10.0* 9.3*  HCT 40.2  < > 37.5 32.2* 29.3* 31.8* 30.3*  MCV 105.2*  --  104.2* 105.6* 104.3* 107.4* 107.1*  PLT 255  --  226 175 167 195 211  < > = values in this interval not displayed.  Cardiac Enzymes: No results for input(s): CKTOTAL, CKMB, CKMBINDEX, TROPONINI in the last 168 hours.  Lipid Panel: No results for input(s): CHOL, TRIG, HDL, CHOLHDL, VLDL, LDLCALC in the last 168 hours.  CBG: No results for input(s): GLUCAP in the last 168 hours.  Microbiology: Results for orders placed or performed during the hospital encounter of 07/27/15  MRSA PCR Screening     Status: None   Collection Time: 07/27/15 11:19 PM  Result Value Ref Range Status   MRSA by PCR NEGATIVE NEGATIVE Final    Comment:        The GeneXpert MRSA Assay (FDA approved for NASAL specimens only), is one component of a comprehensive MRSA colonization surveillance program. It is not intended to diagnose MRSA infection nor to guide or monitor treatment for MRSA infections.   Urine culture     Status: None   Collection Time: 07/28/15 10:27 AM  Result Value Ref Range Status   Specimen Description URINE, CATHETERIZED  Final   Special Requests NONE  Final   Culture NO GROWTH  Final   Report Status 07/29/2015  FINAL  Final    Coagulation Studies: No results for input(s): LABPROT, INR in the last 72 hours.  Imaging: Ct Head Wo Contrast  08/01/2015  CLINICAL DATA:  Encephalopathy. History of subdural hematoma evacuation. History of brain tumor and brain surgery. EXAM: CT HEAD WITHOUT CONTRAST TECHNIQUE: Contiguous axial images were obtained from the base of the skull through the vertex without intravenous contrast. COMPARISON:  May 23, 2012 FINDINGS: Paranasal sinuses and mastoid air cells are well aerated. The patient is status post bilateral craniotomies which are stable. No acute fractures. Extracranial soft tissues are normal. No subdural, epidural, or subarachnoid hemorrhage. Encephalomalacia in the anterior left temporal lobe is stable. There is decreased attenuation in the left frontal and parietal white matter as seen on axial image 25. There is mild overlying cortical involvement. Most of this finding was not seen in 2014. However, there was probably a small amount of the anterior white matter change in 2014. No acute cortical ischemia. Ventricles and sulci are similar in the interval. The cerebellum, brainstem, and basal cisterns are normal. No midline shift. IMPRESSION: 1. Low-attenuation in the left corona radiata involving the frontal and parietal lobes with mild overlying cortical involvement in the frontal and posterior aspects. 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 While chronicity can be  difficult to determine with certainty on CT imaging, I would favor this is chronic. If there is concern based on symptoms, an MRI could better assess the age of the finding. 2. No other acute abnormalities. 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 Exam shows a very depressed and labile female who is refusing to take part in multiple aspects of exam. She will not move her right arm and right leg is post surgical and has pain. At this time will attempt to get MRI and would recommend Carotid doppler, Echo, A1C and lipid panel.  Recommend: 1. HgbA1c, fasting lipid panel 2. MRI, MRA  of the brain without contrast 3. PT consult, OT consult, Speech consult 4. Echocardiogram 5. Carotid dopplers 6. Prophylactic therapy-Antiplatelet med: Aspirin - dose 325 mg daily 7. Risk factor modification 8. Telemetry monitoring 9. Frequent neuro checks 10 NPO until passes stroke swallow screen 11 please page stroke NP  Or  PA  Or MD from 8am -4 pm  as this patient from this time will be  followed by the stroke.   You can look them up on [email or web address]  Password [email or web address]

## 2015-08-01 NOTE — Consult Note (Signed)
[facility] Consult   Reason for Consult:  Erratic behavior and unstable mood with crying spells Referring Physician:  Dr. Cruzita Lederer Patient Identification: Wanda Griffin MRN:  [id number] Principal Diagnosis: Anxiety and depression Diagnosis:   Patient Active Problem List   Diagnosis Date Noted  . CVA (cerebral vascular accident) ([place]) [I63.9] 08/01/2015  . Malnutrition of moderate degree [E44.0] 07/30/2015  . Femur fracture, right, closed, initial encounter [S72.91XA] 07/28/2015  . Femur fracture ([place]) [S72.90XA] 07/27/2015  . Anxiety and depression [F41.8] 07/27/2015  . Tobacco abuse [Z72.0] 07/27/2015  . Macrocytosis without anemia [D75.89] 07/27/2015  . Hyperlipidemia [E78.5] 07/27/2015  . Chronic constipation [K59.00] 07/27/2015  . COPD (chronic obstructive pulmonary disease) ([place]) [J44.9] 07/27/2015  . Femur fracture, right ([place]) [S72.91XA] 07/27/2015  . Altered mental status [R41.82] 05/24/2012  . Seizure disorder ([place]) [X21.194] 05/24/2012  . Syncope and collapse [R55] 05/24/2012  . Polysubstance (excluding opioids) dependence, daily use ([place]) [F19.20] 05/24/2012    Total Time spent with patient: 1 hour  Subjective:   Wanda Griffin is a 60 y.o. female patient admitted with Status post femur fracture,History of seizures and behavioral changes.  HPI:  Wanda Griffin is a 60 y.o. Female, Seen chart reviewed and case discussed with the Dr. Cruzita Lederer for this face-to-face psychiatric consultation and evaluation of behavioral problems, mood and uncontrollable anxiety. Patient is a poor historian, does not want to use verbal language mostly closed her eyes and making nonverbal sounds for yes or no. Patient stated she does not want to talk any longer and wanted to be isolated and after 25 minutes in her room. Patient is also planned to go for the MRI scan of the brain. Patient endorses being depressed and anxious and also taking medication for depression anxiety  but does not see a psychiatrist. Patient reportedly takes that medication from primary care physician. Patient denied current symptoms of depression, anxiety,auditory/visual hallucinations, delusions and paranoia. Patient reports multiple body pains. Patient has no family members at bedside and could not reach for Collateral information at this time. Review of labs indicated her valproic acid level is less than Therapeutic value which may be able to register higher dose if needed. Review of EKG indicated she has no prolonged QTCso we can give her Seroquel twice daily for now to control unstable emotions.   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 Patient reports that she had been in her usual state of health with no fever, chills, chest pain, palpitations, or dyspnea when she was squatting and pivoting to get into her car this evening. She heard a "pop" from the right thigh and experienced immediate pain. EMS was activated for transported to the hospital. She reportedly had a shortening and external rotation of the right lower extremity and was placed in traction by EMS. 200 g of fentanyl was administered en route.  ED Course: Upon arrival to the ED, patient is found to be afebrile, saturating well on room air, mildly hypertensive, but with vitals otherwise stable. EKG demonstrates a sinus rhythm with short PR and RSR'in V1 with no significant change relative to prior EKGs. Chest x-ray is negative for acute cardiopulmonary disease and radiographs of the right femur demonstrate no bleak,  mild comminuted and displaced fracture of the proximal shaft. CMP is largely unremarkable and CBC is  notable only for MCV of 105.2. INR is 2.98 and type and screen has been performed. Additional fentanyl was administered in the emergency department, patient remained hemodynamically stable, and orthopedic surgery was consulted by the ED physician. Admission to the hospitalist service was requested with plans for operative management of the right femur fracture  Past Psychiatric History: Patient denied being admitted to psychiatric facility and also denied history of suicidal ideation, intention or plans or attempts. Risk to Self: Is patient at risk for suicide?: No Risk to Others:   Prior Inpatient Therapy:   Prior Outpatient Therapy:    Past Medical History:  Past Medical History  Diagnosis Date  . Subdural hematoma ([place]) 2012    After tumor resection  . Brain tumor ([place]) 2012    Resected, had subsequent subdural hematoma  . Mental health disorder   . IBS (irritable bowel syndrome)     chronic constipation  . COPD (chronic obstructive pulmonary disease) ([facility])     FEV1 36% predicted  . 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 Tobacco abuse   . Benzodiazepine abuse   . Altered mental status     /notes "has episodes ~ q month/family" (05/23/2012)  . Anxiety   . Depression   . Seizures [facility])     Past Surgical History  Procedure Laterality Date  . Brain surgery  01/2012    Tumor resection  . Loop recorder implant    . Subdural hematoma evacuation via craniotomy Left 2009  . Subdural hematoma evacuation via craniotomy Right 2014  . Tubal ligation    . Vaginal hysterectomy    . Dilation and curettage of uterus    . Cardiac catheterization    . Coronary angioplasty with stent placement  ~ 2009    "1" (05/23/2012)  . Femur im nail Right 07/28/2015    Procedure: REMOVAL OF TRACTION PIN INTRAMEDULLART (IM) NAIL FEMORAL;  Surgeon: Rod Can, MD;  Location: [facility];  Service: Orthopedics;  Laterality: Right;   Family History:  Family History  Problem  Relation Age of Onset  . Hypertension Mother   . Hyperlipidemia Mother    Family Psychiatric  History: Patient denied family history of mental illness. Social History:  History  Alcohol Use No     History  Drug Use  . Yes  . Special: Marijuana    Comment: 05/23/2012 "last marijuana ~ 3 wk ago; usually smoke once q 2-3 months"    Social History   Social History  . Marital Status: Married    Spouse Name: N/A  . Number of Children: N/A  . Years of Education: N/A   Social History Main Topics  . Smoking status: Current Every Day Smoker -- 0.50 packs/day for 20 years    Types: Cigarettes  . Smokeless tobacco: Never Used     Comment: 05/23/2012 offered smoking cessation materials; pt declines  . Alcohol Use: No  . Drug Use: Yes    Special: Marijuana     Comment: 05/23/2012 "last marijuana ~ 3 wk ago; usually smoke once q 2-3 months"  . Sexual Activity: Yes   Other Topics Concern  . None   Social History Narrative   Lives with husband in [place], [place]. [facility] care is [facility].    Additional Social History:    Allergies:   Allergies  Allergen Reactions  .  Ampicillin Hives  . Asa [Aspirin] Hives  . Colchicine     unknown  . Toradol [Ketorolac Tromethamine] Hives    Labs:  Results for orders placed or performed during the hospital encounter of 07/27/15 (from the past 48 hour(s))  CBC     Status: Abnormal   Collection Time: 07/31/15  3:17 AM  Result Value Ref [place]   WBC 6.6 4.0 - 10.5 K/uL   RBC 2.96 (L) 3.87 - 5.11 MIL/uL   Hemoglobin 10.0 (L) 12.0 - 15.0 g/dL   HCT 31.8 (L) 36.0 - 46.0 %   MCV 107.4 (H) 78.0 - 100.0 fL   MCH 33.8 26.0 - 34.0 pg   MCHC 31.4 30.0 - 36.0 g/dL   RDW 13.7 11.5 - 15.5 %   Platelets 195 150 - 400 K/uL  Basic metabolic panel     Status: None   Collection Time: 07/31/15  3:17 AM  Result Value Ref [place]   Sodium 141 135 - 145 mmol/L   Potassium 4.4 3.5 - 5.1 mmol/L   Chloride 102 101 - 111 mmol/L   CO2 30 22 - 32  mmol/L   Glucose, Bld 82 65 - 99 mg/dL   BUN 13 6 - 20 mg/dL   Creatinine, Ser 0.71 0.44 - 1.00 mg/dL   Calcium 9.1 8.9 - 10.3 mg/dL   GFR calc non Af Amer >60 >60 mL/min   GFR calc Af Amer >60 >60 mL/min    Comment: (NOTE) The eGFR has been calculated using the CKD EPI equation. This calculation has not been validated in all clinical situations. eGFR's persistently <60 mL/min signify possible Chronic Kidney Disease.    Anion gap 9 5 - 15  Ammonia     Status: Abnormal   Collection Time: 07/31/15  3:40 PM  Result Value Ref [place]   Ammonia 47 (H) 9 - 35 umol/L  Blood gas, arterial     Status: Abnormal   Collection Time: 07/31/15  3:55 PM  Result Value Ref [place]   O2 Content 1.5 L/min   Delivery systems NASAL CANNULA    pH, Arterial 7.421 7.350 - 7.450   pCO2 arterial 54.0 (H) 35.0 - 45.0 mmHg   pO2, Arterial 68.6 (L) 80.0 - 100.0 mmHg   Bicarbonate 34.4 (H) 20.0 - 24.0 mEq/L   TCO2 36.1 0 - 100 mmol/L   Acid-Base Excess 9.6 (H) 0.0 - 2.0 mmol/L   O2 Saturation 93.4 %   Patient temperature 98.6    Collection site RIGHT RADIAL    Drawn by [id number]    Sample type ARTERIAL DRAW    Allens test (pass/fail) PASS PASS  CBC     Status: Abnormal   Collection Time: 08/01/15  7:44 AM  Result Value Ref [place]   WBC 5.3 4.0 - 10.5 K/uL   RBC 2.83 (L) 3.87 - 5.11 MIL/uL   Hemoglobin 9.3 (L) 12.0 - 15.0 g/dL   HCT 30.3 (L) 36.0 - 46.0 %   MCV 107.1 (H) 78.0 - 100.0 fL   MCH 32.9 26.0 - 34.0 pg   MCHC 30.7 30.0 - 36.0 g/dL   RDW 13.3 11.5 - 15.5 %   Platelets 211 150 - 400 K/uL  Basic metabolic panel     Status: None   Collection Time: 08/01/15  7:44 AM  Result Value Ref [place]   Sodium 141 135 - 145 mmol/L   Potassium 4.0 3.5 - 5.1 mmol/L   Chloride 105 101 - 111 mmol/L   CO2 27  22 - 32 mmol/L   Glucose, Bld 78 65 - 99 mg/dL   BUN 14 6 - 20 mg/dL   Creatinine, Ser 0.68 0.44 - 1.00 mg/dL   Calcium 8.9 8.9 - 10.3 mg/dL   GFR calc non Af Amer >60 >60 mL/min   GFR calc Af Amer >60  >60 mL/min    Comment: (NOTE) The eGFR has been calculated using the CKD EPI equation. This calculation has not been validated in all clinical situations. eGFR's persistently <60 mL/min signify possible Chronic Kidney Disease.    Anion gap 9 5 - 15    Current Facility-Administered Medications  Medication Dose Route Frequency Provider Last Rate Last Dose  . 0.9 %  sodium chloride infusion   Intravenous Continuous Caren Griffins, MD 50 mL/hr at 07/31/15 1504    . acetaminophen (TYLENOL) tablet 650 mg  650 mg Oral Q6H PRN Rod Can, MD       Or  . acetaminophen (TYLENOL) suppository 650 mg  650 mg Rectal Q6H PRN Rod Can, MD      . ALPRAZolam Duanne Moron) tablet 1 mg  1 mg Oral BID PRN [name], MD   1 mg at 07/29/15 1129  . bisacodyl (DULCOLAX) EC tablet 5 mg  5 mg Oral Daily PRN [name], MD      . divalproex (DEPAKOTE) DR tablet 500 mg  500 mg Oral TID [name], MD   500 mg at 08/01/15 0857  . docusate sodium (COLACE) capsule 100 mg  100 mg Oral BID Rod Can, MD   100 mg at 08/01/15 0857  . enoxaparin (LOVENOX) injection 40 mg  40 mg Subcutaneous Q24H Rod Can, MD   40 mg at 08/01/15 0856  . feeding supplement (ENSURE ENLIVE) (ENSURE ENLIVE) liquid 237 mL  237 mL Oral BID BM Albertine Patricia, MD   237 mL at 08/01/15 0856  . ipratropium-albuterol (DUONEB) 0.5-2.5 (3) MG/3ML nebulizer solution 3 mL  3 mL Nebulization Q4H PRN Ilene Qua Opyd, MD      . lactulose (CHRONULAC) 10 GM/15ML solution 10 g  10 g Oral BID Caren Griffins, MD   10 g at 08/01/15 0856  . lubiprostone (AMITIZA) capsule 24 mcg  24 mcg Oral BID WC [name], MD   24 mcg at 08/01/15 0856  . magnesium citrate solution 1 Bottle  1 Bottle Oral Once PRN Rod Can, MD      . menthol-cetylpyridinium (CEPACOL) lozenge 3 mg  1 lozenge Oral PRN Rod Can, MD   3 mg at 07/28/15 2230   Or  . phenol (CHLORASEPTIC) mouth spray 1 spray  1 spray Mouth/Throat PRN Rod Can, MD       . metoCLOPramide (REGLAN) tablet 5-10 mg  5-10 mg Oral Q8H PRN Rod Can, MD       Or  . metoCLOPramide (REGLAN) injection 5-10 mg  5-10 mg Intravenous Q8H PRN Rod Can, MD      . mometasone-formoterol (DULERA) 200-5 MCG/ACT inhaler 2 puff  2 puff Inhalation BID [name], MD   2 puff at 07/28/15 1211  . nicotine (NICODERM CQ - dosed in mg/24 hours) patch 14 mg  14 mg Transdermal Daily PRN [name], MD      . ondansetron (ZOFRAN) tablet 4 mg  4 mg Oral Q6H PRN Rod Can, MD       Or  . ondansetron (ZOFRAN) injection 4 mg  4 mg Intravenous Q6H PRN Rod Can, MD      .  oxyCODONE-acetaminophen (PERCOCET/ROXICET) 5-325 MG per tablet 1 tablet  1 tablet Oral Q6H PRN Caren Griffins, MD   1 tablet at 07/31/15 2313  . polyethylene glycol (MIRALAX / GLYCOLAX) packet 17 g  17 g Oral Daily PRN [name], MD      . QUEtiapine (SEROQUEL) tablet 25 mg  25 mg Oral QHS [name], MD   25 mg at 07/31/15 2314  . rosuvastatin (CRESTOR) tablet 20 mg  20 mg Oral Daily [name], MD   20 mg at 08/01/15 0856  . senna (SENOKOT) tablet 8.6 mg  1 tablet Oral BID Rod Can, MD   8.6 mg at 08/01/15 0857  . sorbitol 70 % solution 30 mL  30 mL Oral Daily PRN Rod Can, MD        Musculoskeletal: Strength & Muscle Tone: decreased Gait & Station: unable to stand Patient leans: N/A  Psychiatric Specialty Exam: Physical Exam as per history and physical  ROS Generalized body pains, weakness, restlessness, isolated and withdrawn. No Fever-chills, No Headache, No changes with Vision or hearing, reports vertigo No problems swallowing food or Liquids, No Chest pain, Cough or Shortness of Breath, No Abdominal pain, No Nausea or Vommitting, Bowel movements are regular, No Blood in stool or Urine, No dysuria, No new skin rashes or bruises, No new joints pains-aches,  No new weakness, tingling, numbness in any extremity, No recent weight gain or loss, No polyuria,  polydypsia or polyphagia,   A full 10 point Review of Systems was done, except as stated above, all other Review of Systems were negative.  Blood pressure 128/51, pulse 102, temperature 98.6 F (37 C), temperature source Oral, resp. rate 16, SpO2 93 %.There is no weight on file to calculate BMI.  General Appearance: Bizarre and Guarded  Eye Contact:  Minimal  Speech:  Slow, Slurred and mostly nonverbal communication, making sounds indicating yes or no further questions  Volume:  Decreased  Mood:  Anxious, Depressed, Irritable and Worthless  Affect:  Constricted and Depressed  Thought Process:  Goal Directed  Orientation:  Full (Time, Place, and Person)  Thought Content:  Logical  Suicidal Thoughts:  No  Homicidal Thoughts:  No  Memory:  Immediate;   Fair Recent;   Fair  Judgement:  Impaired  Insight:  Shallow  Psychomotor Activity:  Restlessness  Concentration:  Concentration: Fair and Attention Span: Fair  Recall:  [employer] of Knowledge:  Fair  Language:  Fair  Akathisia:  Negative  Handed:  Right  AIMS (if indicated):     Assets:  Communication Skills Desire for Improvement Financial Resources/Insurance Housing Intimacy Leisure Time Resilience Social Support Transportation  ADL's:  Impaired  Cognition:  WNL  Sleep:        Treatment Plan Summary: Daily contact with patient to assess and evaluate symptoms and progress in treatment and Medication management   Bizarre behaviors: We will increase Seroquel 25 mg twice daily and monitor for the QTC prolongation  More swings:Patient may benefit from higher dose of DepakoteAs her current valproic acid level is less than therapeutic and also questions about partial compliance with medication  Altered mental status:please review the father medical reasons including stroke and Subclinical seizures, benefit from the neurology consult services  Refer to the social service regarding contacting patient husband for collateral  information about history of psychiatric illness and treatment history  Appreciate psychiatric consultation and follow up as clinically required Please contact [phone] or [phone] if needs  further assistance  Disposition: Patient does not meet criteria for psychiatric inpatient admission. Supportive therapy provided about ongoing stressors.  Ambrose Finland, MD 08/01/2015 1:02 PM

## 2015-08-01 NOTE — Progress Notes (Signed)
EEG completed, results pending. 

## 2015-08-01 NOTE — Progress Notes (Signed)
PT Cancellation Note  Patient Details Name: Wanda Griffin MRN: [id number] DOB: 09/17/1955   Cancelled Treatment:    Reason Eval/Treat Not Completed:Neurology in with pt. Per RN, pt is still drowsy. Will check back later this afternoon to see if pt is able to participate. Thanks.    Weston Anna, MPT Pager: [phone]

## 2015-08-01 NOTE — Procedures (Signed)
EEG Report  Clinical History:  Mental Status Changes  Technical Summary:  A 19 channel digital EEG recording was performed using the 10-20 international system of electrode placement.  Bipolar and Referential montages were used.  The total recording time was 20 minutes.  Findings:  Background frequencies are in the 5-6 Hz range.  There is higher amplitude and slightly more pronounced slowing in the left temporal area possibly due to breach rhythm from prior subdural hematoma evacuation in 2009.  Photic stimulation and  Hyperventilation were not performed.  There were no epileptiform discharges or electrographic seizures present.   Impression:   This is an abnormal EEG.  There is evidence of moderate generalized slowing of brain activity more pronounced in the left temporal area.  This is non-specific but may be due to toxic, metabolic, hypoxic-ischemic, or infectious etiologies as some examples.  A local structural process in the left temporal lobe needs to be ruled out as well.  The patient is not in non-convulsive status epilepticus.  Clinical correlation is recommended.

## 2015-08-01 NOTE — Progress Notes (Signed)
   Subjective:  Patient with MS changes - currently being worked up by medical team  Objective:   VITALS:   Filed Vitals:   07/31/15 0615 07/31/15 1300 07/31/15 1955 08/01/15 0656  BP: 116/85 100/62 144/57 128/51  Pulse: 120 109 110 102  Temp: 99.3 F (37.4 C) 98.5 F (36.9 C) 99.5 F (37.5 C) 98.6 F (37 C)  TempSrc: Oral Oral Oral Oral  Resp: 15 16 16 16   SpO2:  99% 97% 93%    NAD, verbally abusive and confused ABD soft Sensation intact distally Intact pulses distally Dorsiflexion/Plantar flexion intact Incision: dressing C/D/I Compartment soft   Lab Results  Component Value Date   WBC 6.6 07/31/2015   HGB 10.0* 07/31/2015   HCT 31.8* 07/31/2015   MCV 107.4* 07/31/2015   PLT 195 07/31/2015   BMET    Component Value Date/Time   NA 141 07/31/2015 0317   K 4.4 07/31/2015 0317   CL 102 07/31/2015 0317   CO2 30 07/31/2015 0317   GLUCOSE 82 07/31/2015 0317   BUN 13 07/31/2015 0317   CREATININE 0.71 07/31/2015 0317   CALCIUM 9.1 07/31/2015 0317   GFRNONAA >60 07/31/2015 0317   GFRAA >60 07/31/2015 0317     Assessment/Plan: 4 Days Post-Op   Principal Problem:   Femur fracture ([facility]) Active Problems:   Seizure disorder ([facility])   Anxiety and depression   Tobacco abuse   Macrocytosis without anemia   Hyperlipidemia   Chronic constipation   COPD (chronic obstructive pulmonary disease) (HCC)   Femur fracture, right (HCC)   Femur fracture, right, closed, initial encounter   Malnutrition of moderate degree    TDWB RLE with walker DVT ppx: lovenox x30 days PO pain control PT/OT Smoking cessation Dispo: SNF placement when ok with medical team    Elie Goody 08/01/2015, 9:32 AM   Rod Can, MD Cell [phone]

## 2015-08-01 NOTE — Clinical Social Work Note (Signed)
Room visit with patient and bed offer provided [facility]. Unit CSW will continue to follow and facilitate discharge to a skilled facility when medically stable.  Corene Resnick Givens, MSW, LCSW Licensed Clinical Social Worker [place] [phone]

## 2015-08-01 NOTE — Progress Notes (Signed)
PROGRESS NOTE  Wanda Griffin [id number] DOB: 19-Apr-1955 DOA: 07/27/2015 PCP: [name], MD   LOS: 5 days   Brief Narrative: 60 y.o. 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 Lyla Glassing on 6/26. - PT consulted, recommending SNF, patient now agreeable. SW consulted  Acute encephalopathy / lethargy - may be related to #1, however there is a strong psych component as well - minimize narcotics - ABG with mild chronic respiratory acidosis, compensated  UTI - s/p ciprofloxacin. Discontinue as cultures negative  Anxiety and depression  - Continue current management with Seroquel and prn Xanax  - patient with impressive child like behavior in the past 2 days with episodes of crying, refusing to participate in physical exam. Psychiatry consulted.   Seizure disorder  - Likely secondary to remote brain tumor s/p resection  - Pt reports no recent seizure  - Continue current management with Depakote   COPD  -  ABG pending - Continue ICS/LABA with Dulera while in hospital  - DuoNeb q4h prn SOB or wheezing   Hyperlipidemia  - Continue Crestor   Tobacco abuse  - Nicotine patch available  - RN asked to provide smoking cessation information prior to discharge   Macrocytosis  - MCV 105.2 without anemia  - B 12 normal, but on the lower side, received supplement for 3 days IM injections, and discharged on by mouth    DVT prophylaxis: Lovenox Code Status: Full Family Communication: no family bedside Disposition Plan: SNF once encephalopathy improves  Consultants:   Orthopedic surgery  Neurology  Psychiatry    Procedures:   None   Antimicrobials:  Ciprofloxacin << 6/30  Subjective: - crying loudly every time I am trying to ask her something   Objective: Filed Vitals:   07/31/15 0615 07/31/15 1300 07/31/15 1955 08/01/15 0656  BP: 116/85 100/62 144/57 128/51  Pulse: 120 109 110 102  Temp: 99.3 F (37.4 C) 98.5 F (36.9 C) 99.5 F (37.5 C) 98.6 F (37 C)  TempSrc: Oral Oral Oral Oral  Resp: 15 16 16 16   SpO2:  99% 97% 93%    Intake/Output Summary (Last 24 hours) at 08/01/15 1252 Last data filed at 07/31/15 1300  Gross per 24 hour  Intake    120 ml  Output      0 ml  Net    120 ml   Examination: Constitutional: lethargic   Filed Vitals:   07/31/15 0615 07/31/15 1300 07/31/15 1955 08/01/15 0656  BP: 116/85 100/62 144/57 128/51  Pulse: 120 109 110 102  Temp: 99.3 F (37.4 C) 98.5 F (36.9 C) 99.5  F (37.5 C) 98.6 F (37 C)  TempSrc: Oral Oral Oral Oral  Resp: 15 16 16 16   SpO2:  99% 97% 93%   Eyes: PERRL, lids and conjunctivae normal Respiratory: clear to auscultation bilaterally, no wheezing, no crackles. Normal respiratory effort.  Cardiovascular: Regular rate and rhythm, no murmurs / rubs / gallops. No LE edema. 2+ pedal pulses. Abdomen: no tenderness. Bowel sounds positive.  Musculoskeletal: no clubbing / cyanosis.  Neurologic: not fully  participating today, appears to move right arm less than the left  Data Reviewed: I have personally reviewed following labs and imaging studies  CBC:  Recent Labs Lab 07/27/15 1712  07/28/15 0553 07/29/15 0528 07/30/15 0340 07/31/15 0317 08/01/15 0744  WBC 7.2  --  7.4 7.7 7.3 6.6 5.3  NEUTROABS 5.0  --   --   --   --   --   --   HGB 12.4  < > 11.7* 10.3* 9.2* 10.0* 9.3*  HCT 40.2  < > 37.5 32.2* 29.3* 31.8* 30.3*  MCV 105.2*  --  104.2* 105.6* 104.3* 107.4* 107.1*  PLT 255  --  226 175 167 195 211  < > = values in this interval not displayed. Basic Metabolic Panel:  Recent Labs Lab 07/28/15 0553 07/29/15 0528 07/30/15 0340 07/31/15 0317 08/01/15 0744  NA 138 138 141 141 141  K 4.0 4.1 3.7 4.4 4.0  CL 106 101 104 102 105  CO2 26 30 28 30 27   GLUCOSE 116* 119* 103* 82 78  BUN 5* <5* <5* 13 14  CREATININE 0.71 0.87 0.63 0.71 0.68  CALCIUM 9.4 9.1 8.9 9.1 8.9   GFR: CrCl cannot be calculated (Unknown ideal weight.). Liver Function Tests:  Recent Labs Lab 07/27/15 1712  AST 18  ALT 12*  ALKPHOS 78  BILITOT 0.1*  PROT 6.2*  ALBUMIN 3.4*   No results for input(s): LIPASE, AMYLASE in the last 168 hours.  Recent Labs Lab 07/31/15 1540  AMMONIA 47*   Coagulation Profile:  Recent Labs Lab 07/27/15 1712  INR 0.98   Urine analysis:    Component Value Date/Time   COLORURINE YELLOW 07/28/2015 1027   APPEARANCEUR CLEAR 07/28/2015 1027   LABSPEC 1.011 07/28/2015 1027   PHURINE 6.0 07/28/2015 1027   GLUCOSEU NEGATIVE 07/28/2015 1027   HGBUR TRACE* 07/28/2015 1027   [place] 07/28/2015 1027   [place] 07/28/2015 1027   PROTEINUR NEGATIVE 07/28/2015 1027   UROBILINOGEN 1.0 05/23/2012 1359   NITRITE NEGATIVE 07/28/2015 1027   LEUKOCYTESUR MODERATE* 07/28/2015 1027   Sepsis Labs: Invalid input(s): PROCALCITONIN, LACTICIDVEN  Recent Results (from the past 240 hour(s))  MRSA PCR Screening     Status: None   Collection Time:  07/27/15 11:19 PM  Result Value Ref Range Status   MRSA by PCR NEGATIVE NEGATIVE Final    Comment:        The GeneXpert MRSA Assay (FDA approved for NASAL specimens only), is one component of a comprehensive MRSA colonization surveillance program. It is not intended to diagnose MRSA infection nor to guide or monitor treatment for MRSA infections.   Urine culture     Status: None   Collection Time: 07/28/15 10:27 AM  Result Value Ref Range Status   Specimen Description URINE, CATHETERIZED  Final   Special Requests NONE  Final   Culture NO GROWTH  Final   Report Status 07/29/2015 FINAL  Final      Radiology Studies: Ct Head Wo Contrast  08/01/2015  CLINICAL DATA:  Encephalopathy. History of subdural hematoma evacuation. History of brain tumor and brain surgery. EXAM: CT HEAD WITHOUT CONTRAST TECHNIQUE: Contiguous axial images were obtained from the base of the skull through the vertex without intravenous contrast. COMPARISON:  May 23, 2012 FINDINGS: Paranasal sinuses and mastoid air cells are well aerated. The patient is status post bilateral craniotomies which are stable. No acute fractures. Extracranial soft tissues are normal. No subdural, epidural, or subarachnoid hemorrhage. Encephalomalacia in the anterior left temporal lobe is stable. There is decreased attenuation in the left frontal and parietal white matter as seen on axial image 25. There is mild overlying cortical involvement. Most of this finding was not seen in 2014. However, there was probably a small amount of the anterior white matter change in 2014. No acute cortical ischemia. Ventricles and sulci are similar in the interval. The cerebellum, brainstem, and basal cisterns are normal. No midline shift. IMPRESSION: 1. Low-attenuation in the left corona radiata involving the frontal and parietal lobes with mild overlying cortical involvement in the frontal and posterior aspects. In retrospect, the anterior aspect was probably  present on the previous study in 2014 and this is favored to represent a watershed infarct. While chronicity can be difficult to determine with certainty on CT imaging, I would favor this is chronic. If there is concern based on symptoms, an MRI could better assess the age of the finding. 2. No other acute abnormalities. Electronically Signed   By: Dorise Bullion III M.D   On: 08/01/2015 00:16     Scheduled Meds: . ciprofloxacin  500 mg Oral BID  . divalproex  500 mg Oral TID  . docusate sodium  100 mg Oral BID  . enoxaparin (LOVENOX) injection  40 mg Subcutaneous Q24H  . feeding supplement (ENSURE ENLIVE)  237 mL Oral BID BM  . lactulose  10 g Oral BID  . lubiprostone  24 mcg Oral BID WC  . mometasone-formoterol  2 puff Inhalation BID  . QUEtiapine  25 mg Oral QHS  . rosuvastatin  20 mg Oral Daily  . senna  1 tablet Oral BID   Continuous Infusions: . sodium chloride 50 mL/hr at 07/31/15 1504     Marzetta Board, MD, PhD [facility] Pager [phone]  If 7PM-7AM, please contact night-coverage [email or web address] Password [email or web address] 08/01/2015, 12:52 PM

## 2015-08-01 NOTE — Progress Notes (Signed)
PT Cancellation Note  Patient Details Name: Wanda Griffin MRN: [id number] DOB: 1955-04-02   Cancelled Treatment:    Reason Eval/Treat Not Completed: Pain limiting ability to participate. Pt declined participation with PT on today. Will check back another day.  Neuro now following-per note "possible watershed CVA" and abnormal EEG.    Weston Anna, MPT Pager: [phone]

## 2015-08-01 NOTE — Progress Notes (Signed)
OT Cancellation Note  Patient Details Name: Wanda Griffin MRN: [id number] DOB: 01-09-1956   Cancelled Treatment:    Reason Eval/Treat Not Completed: Patient at procedure or test/ unavailable (MRI). Neuro involved for possible watershed CVA. Will follow.  Malka So 08/01/2015, 3:39 PM  [phone]

## 2015-08-02 ENCOUNTER — Encounter (HOSPITAL_COMMUNITY): Payer: Self-pay | Admitting: *Deleted

## 2015-08-02 ENCOUNTER — Inpatient Hospital Stay (HOSPITAL_COMMUNITY): Payer: Medicare Other

## 2015-08-02 DIAGNOSIS — I63032 Cerebral infarction due to thrombosis of left carotid artery: Secondary | ICD-10-CM

## 2015-08-02 MED ORDER — IOPAMIDOL (ISOVUE-370) INJECTION 76%
INTRAVENOUS | Status: AC
  Administered 2015-08-02: 50 mL
  Filled 2015-08-02: qty 50

## 2015-08-02 MED ORDER — CLOPIDOGREL BISULFATE 75 MG PO TABS
75.0000 mg | ORAL_TABLET | Freq: Every day | ORAL | Status: DC
  Administered 2015-08-02 – 2015-08-04 (×3): 75 mg via ORAL
  Filled 2015-08-02 (×3): qty 1

## 2015-08-02 NOTE — Progress Notes (Signed)
PROGRESS NOTE  Wanda Griffin [id number] DOB: 06/17/1955 DOA: 07/27/2015 PCP: [name], MD   LOS: 6 days   Brief Narrative: 60 y.o. 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 Delfino Lovett on 6/27  Assessment & Plan: Principal Problem:   Anxiety and depression Active Problems:   Seizure disorder ([place])   Femur fracture ([facility])   Tobacco abuse   Macrocytosis without anemia   Hyperlipidemia   Chronic constipation   COPD (chronic obstructive pulmonary disease) ([facility])   Femur fracture, right (HCC)   Femur fracture, right, closed, initial encounter   Malnutrition of moderate degree   CVA (cerebral vascular accident) ([place])   Other depression due to general medical condition  CVA - patient with progressive lethargy following surgery, narcotics discontinued initially, ABG with chronic hypercarbia without acidosis, she had a CT head which showed low-attenuation in the left corona radiata involving the frontal and parietal lobes with mild overlying cortical involvement in the frontal and posterior aspects, probably present in 2014 per radiology favoring chronicity. Neurology was consulted. MRI confirmed acute superimposed upon chronic left hemisphere watershed infarcts - Echo, carotid dopplers pending - stroke to see today   Proximal femur fracture, right  - Secondary to fall, orthopedic consult greatly appreciated, Status post surgical repair by Dr. Lyla Glassing on 6/26. - PT consulted, recommending SNF, patient now agreeable. SW consulted - placement pending neuro workup  Acute encephalopathy / lethargy - may be related to #1, however there is a strong psych component as well - minimize  narcotics - ABG with mild chronic respiratory acidosis, compensated - patient improved today, more appropriate once her seroquel was adjusted per psychiatry   UTI - received ciprofloxacin. Discontinued as cultures negative  Anxiety and depression  - Continue current management with Seroquel and prn Xanax  - patient with child like behavior and refusing to participate in physical exam for 3 days, with episodes of crying. Psychiatry consulted, appreciate input, Seroquel adjusted. Much improved today  Seizure disorder  - Likely secondary to remote brain tumor s/p resection  - Pt reports no recent seizure  - Continue current management with Depakote   COPD  - ABG pending - Continue ICS/LABA with Dulera while in hospital  - DuoNeb q4h prn SOB or wheezing   Hyperlipidemia  - Continue Crestor   Tobacco abuse  - Nicotine patch available  - RN asked to provide smoking cessation information prior to discharge   Macrocytosis  - MCV 105.2 without anemia  - B 12 normal, but on the lower side, received supplement for 3 days IM injections, and discharged on by mouth    DVT prophylaxis: Lovenox Code Status: Full Family Communication: no family bedside Disposition Plan: SNF 2-3 days  Consultants:   Orthopedic surgery  Neurology  Psychiatry    Procedures:   None   Antimicrobials:  Ciprofloxacin << 6/30  Subjective: - more appropriate, complains of pain RLE, no fever/chills  Objective: Filed Vitals:   08/01/15 0656 08/01/15 1423 08/01/15 2100 08/02/15 0618  BP: 128/51 134/54 136/50 132/50  Pulse: 102 97 95 90  Temp: 98.6 F (37 C) 98.4 F (36.9 C) 99.2 F (37.3 C) 99 F (37.2 C)  TempSrc: Oral Oral Oral Oral  Resp: 16 18 17 16   SpO2: 93%  100% 98% 100%    Intake/Output Summary (Last 24 hours) at 08/02/15 1101 Last data filed at 08/02/15 0932  Gross per 24 hour  Intake      0 ml  Output      0 ml  Net      0 ml   Examination: Constitutional:  lethargic   Filed Vitals:   08/01/15 0656 08/01/15 1423 08/01/15 2100 08/02/15 0618  BP: 128/51 134/54 136/50 132/50  Pulse: 102 97 95 90  Temp: 98.6 F (37 C) 98.4 F (36.9 C) 99.2 F (37.3 C) 99 F (37.2 C)  TempSrc: Oral Oral Oral Oral  Resp: 16 18 17 16   SpO2: 93% 100% 98% 100%   Eyes: PERRL, lids and conjunctivae normal Respiratory: clear to auscultation bilaterally, no wheezing, no crackles. Normal respiratory effort.  Cardiovascular: Regular rate and rhythm, no murmurs / rubs / gallops. No LE edema. 2+ pedal pulses. Abdomen: no tenderness. Bowel sounds positive.  Musculoskeletal: no clubbing / cyanosis.  Neurologic: intermittently participating limited by generalized pain  Data Reviewed: I have personally reviewed following labs and imaging studies  CBC:  Recent Labs Lab 07/27/15 1712  07/28/15 0553 07/29/15 0528 07/30/15 0340 07/31/15 0317 08/01/15 0744  WBC 7.2  --  7.4 7.7 7.3 6.6 5.3  NEUTROABS 5.0  --   --   --   --   --   --   HGB 12.4  < > 11.7* 10.3* 9.2* 10.0* 9.3*  HCT 40.2  < > 37.5 32.2* 29.3* 31.8* 30.3*  MCV 105.2*  --  104.2* 105.6* 104.3* 107.4* 107.1*  PLT 255  --  226 175 167 195 211  < > = values in this interval not displayed. Basic Metabolic Panel:  Recent Labs Lab 07/28/15 0553 07/29/15 0528 07/30/15 0340 07/31/15 0317 08/01/15 0744  NA 138 138 141 141 141  K 4.0 4.1 3.7 4.4 4.0  CL 106 101 104 102 105  CO2 26 30 28 30 27   GLUCOSE 116* 119* 103* 82 78  BUN 5* <5* <5* 13 14  CREATININE 0.71 0.87 0.63 0.71 0.68  CALCIUM 9.4 9.1 8.9 9.1 8.9   GFR: CrCl cannot be calculated (Unknown ideal weight.). Liver Function Tests:  Recent Labs Lab 07/27/15 1712  AST 18  ALT 12*  ALKPHOS 78  BILITOT 0.1*  PROT 6.2*  ALBUMIN 3.4*   No results for input(s): LIPASE, AMYLASE in the last 168 hours.  Recent Labs Lab 07/31/15 1540  AMMONIA 47*   Coagulation Profile:  Recent Labs Lab 07/27/15 1712  INR 0.98   Urine analysis:     Component Value Date/Time   COLORURINE YELLOW 07/28/2015 1027   APPEARANCEUR CLEAR 07/28/2015 1027   LABSPEC 1.011 07/28/2015 1027   PHURINE 6.0 07/28/2015 1027   GLUCOSEU NEGATIVE 07/28/2015 1027   HGBUR TRACE* 07/28/2015 1027   [place] 07/28/2015 1027   [place] 07/28/2015 1027   PROTEINUR NEGATIVE 07/28/2015 1027   UROBILINOGEN 1.0 05/23/2012 1359   NITRITE NEGATIVE 07/28/2015 1027   LEUKOCYTESUR MODERATE* 07/28/2015 1027   Sepsis Labs: Invalid input(s): PROCALCITONIN, LACTICIDVEN  Recent Results (from the past 240 hour(s))  MRSA PCR Screening     Status: None   Collection Time: 07/27/15 11:19 PM  Result Value Ref Range Status   MRSA by PCR NEGATIVE NEGATIVE Final    Comment:        The GeneXpert MRSA Assay (FDA approved for NASAL specimens only), is one component of a comprehensive MRSA colonization surveillance  program. It is not intended to diagnose MRSA infection nor to guide or monitor treatment for MRSA infections.   Urine culture     Status: None   Collection Time: 07/28/15 10:27 AM  Result Value Ref Range Status   Specimen Description URINE, CATHETERIZED  Final   Special Requests NONE  Final   Culture NO GROWTH  Final   Report Status 07/29/2015 FINAL  Final      Radiology Studies: Ct Head Wo Contrast  08/01/2015  CLINICAL DATA:  Encephalopathy. History of subdural hematoma evacuation. History of brain tumor and brain surgery. EXAM: CT HEAD WITHOUT CONTRAST TECHNIQUE: Contiguous axial images were obtained from the base of the skull through the vertex without intravenous contrast. COMPARISON:  May 23, 2012 FINDINGS: Paranasal sinuses and mastoid air cells are well aerated. The patient is status post bilateral craniotomies which are stable. No acute fractures. Extracranial soft tissues are normal. No subdural, epidural, or subarachnoid hemorrhage. Encephalomalacia in the anterior left temporal lobe is stable. There is decreased  attenuation in the left frontal and parietal white matter as seen on axial image 25. There is mild overlying cortical involvement. Most of this finding was not seen in 2014. However, there was probably a small amount of the anterior white matter change in 2014. No acute cortical ischemia. Ventricles and sulci are similar in the interval. The cerebellum, brainstem, and basal cisterns are normal. No midline shift. IMPRESSION: 1. Low-attenuation in the left corona radiata involving the frontal and parietal lobes with mild overlying cortical involvement in the frontal and posterior aspects. 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 While chronicity can be difficult to determine with certainty on CT imaging, I would favor this is chronic. If there is concern based on symptoms, an MRI could better assess the age of the finding. 2. No other acute abnormalities. Electronically Signed   By: Dorise Bullion III M.D   On: 08/01/2015 00:16   Mr Brain Wo Contrast  08/01/2015  CLINICAL DATA:  60 year old with remote history of brain tumor status post resection and subsequent seizure disorder. History subdural hematomas. Remote ovarian cancer. Subclavian steal syndrome. Recent femur fracture. Post surgery 07/29/2015 patient developed right-sided weakness. Abnormal CT. Subsequent encounter. EXAM: MRI HEAD WITHOUT CONTRAST TECHNIQUE: Multiplanar, multiecho pulse sequences of the brain and surrounding structures were obtained without intravenous contrast. COMPARISON:  07/31/2015 and 05/23/2012 head CT. No comparison brain MR. FINDINGS: Patient did not complete entire MR examination. Acute superimposed upon chronic left hemisphere watershed infarcts. 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 Decreased flow within left middle cerebral artery. Post bilateral craniotomies. No acute intracranial hemorrhage identified. Anterior left  temporal lobe cystic encephalomalacia. This may reflect result of resection of tumor per history provided or secondary to prior trauma. There are no comparison MR scans to confirm stability of this appearance. If there were any progressive symptoms felt to be unrelated to acute infarcts than this could be further evaluated on follow-up contrast-enhanced MR (may require sedation). Global atrophy greater left hemisphere.  No hydrocephalus. IMPRESSION: Patient did not complete entire MR examination. Images are motion degraded. Acute superimposed upon chronic left hemisphere watershed infarcts. 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 Decreased flow within left middle cerebral artery. Post bilateral craniotomies. No acute intracranial hemorrhage identified. Anterior left temporal lobe cystic encephalomalacia. This may reflect result of resection of tumor per history provided or secondary to prior trauma. Follow-up as noted  above. Electronically Signed   By: Genia Del M.D.   On: 08/01/2015 16:28     Scheduled Meds: . divalproex  500 mg Oral TID  . docusate sodium  100 mg Oral BID  . enoxaparin (LOVENOX) injection  40 mg Subcutaneous Q24H  . feeding supplement (ENSURE ENLIVE)  237 mL Oral BID BM  . lubiprostone  24 mcg Oral BID WC  . mometasone-formoterol  2 puff Inhalation BID  . QUEtiapine  25 mg Oral BID  . rosuvastatin  20 mg Oral Daily  . senna  1 tablet Oral BID   Continuous Infusions: . sodium chloride 50 mL/hr at 08/01/15 2151     Marzetta Board, MD, PhD [facility] Pager [phone]  If 7PM-7AM, please contact night-coverage [email or web address] Password [email or web address] 08/02/2015, 11:01 AM

## 2015-08-02 NOTE — Progress Notes (Signed)
Patient refuses to eat any food. Patient only drinks sips of water, refuses snacks as well. Nurse will continue to encourage patient to eat.

## 2015-08-02 NOTE — Progress Notes (Signed)
STROKE TEAM PROGRESS NOTE   HISTORY OF PRESENT ILLNESS (per record) Wanda Griffin is an 60 y.o. 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 Delfino Lovett on 6/27. After surgery she was noted to possibly have right sided weakness. CT scan was notable for possible watershed infarct. Patient initially refused MRI brain. This will be reattempted. On consultation she is very depressed and labile, crying when asked the date and year. She quickly responds to questions with "I do not know, and please do not ask me". She refuses to take part in both exam and consult questions.    SUBJECTIVE (INTERVAL HISTORY) No family members present. The patient was lethargic and voiced no complaints. She was not able to provide any information regarding her carotid artery disease.   OBJECTIVE Temp:  [99 F (37.2 C)-99.2 F (37.3 C)] 99 F (37.2 C) (07/01 1415) Pulse Rate:  [83-95] 83 (07/01 1415) Cardiac Rhythm:  [-]  Resp:  [16-17] 16 (07/01 1415) BP: (121-136)/(34-50) 121/34 mmHg (07/01 1415) SpO2:  [98 %-100 %] 100 % (07/01 1415)  CBC:  Recent Labs Lab 07/27/15 1712  07/31/15 0317 08/01/15 0744  WBC 7.2  < > 6.6 5.3  NEUTROABS 5.0  --   --   --   HGB 12.4  < > 10.0* 9.3*  HCT 40.2  < > 31.8* 30.3*  MCV 105.2*  < > 107.4* 107.1*  PLT 255  < > 195 211  < > = values in this interval not displayed.  Basic Metabolic Panel:   Recent Labs Lab 07/31/15 0317 08/01/15 0744  NA 141 141  K 4.4 4.0  CL 102 105  CO2 30 27  GLUCOSE 82 78  BUN 13 14  CREATININE 0.71 0.68  CALCIUM 9.1 8.9    Lipid Panel: No results found for: CHOL, TRIG, HDL, CHOLHDL, VLDL, LDLCALC HgbA1c: No results found for: HGBA1C Urine Drug Screen:      Component Value Date/Time   LABOPIA POSITIVE* 05/23/2012 1359   COCAINSCRNUR NONE DETECTED 05/23/2012 1359   LABBENZ POSITIVE* 05/23/2012 1359   AMPHETMU NONE DETECTED 05/23/2012 1359   THCU POSITIVE* 05/23/2012 1359   LABBARB NONE DETECTED 05/23/2012 1359      IMAGING  Ct Head Wo Contrast 08/01/2015   1. Low-attenuation in the left corona radiata involving the frontal and parietal lobes with mild overlying cortical involvement in the frontal and posterior aspects. 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 While chronicity can be difficult to determine with certainty on CT imaging, I would favor this is chronic. If there is concern based on symptoms, an MRI could better assess the age of the finding.  2. No other acute abnormalities.    Mr Brain Wo Contrast 08/01/2015   Patient did not complete entire MR examination. Images are motion degraded.  Acute superimposed upon chronic left hemisphere watershed infarcts.  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  Decreased flow within left middle cerebral artery.  Post bilateral craniotomies.  No acute intracranial hemorrhage identified. Anterior left temporal lobe cystic encephalomalacia. This may reflect result of resection of tumor per history provided or secondary to prior trauma. If there were any progressive symptoms felt to be  unrelated to acute infarcts than this could be further evaluated on follow-up contrast-enhanced MR (may require sedation).   EEG 08/01/2015 This is an abnormal EEG. There is evidence of moderate generalized slowing of brain activity more pronounced in the left temporal area. This is non-specific but may be due to toxic, metabolic, hypoxic-ischemic, or infectious etiologies as some examples. A local structural process in the left temporal lobe needs to be ruled out as well. The patient is not in non-convulsive  status epilepticus. Clinical correlation is recommended.     PHYSICAL EXAM  Elderly frail Caucasian lady who is uncooperative for detailed exam. . Afebrile. Head is nontraumatic. Neck is supple without bruit.    Cardiac exam no murmur or gallop. Lungs are clear to auscultation. Distal pulses are well felt. Right leg movements limited by pain from recent surgery and bandages.  Neurological Exam : limited due to poor effort and lack of cooperation Alert, oriented to hospital, June and year, labile and depressed mood. Speech fluent without evidence of aphasia. Able to follow simple commands if desires to. Cranial Nerves: II: Visual fields grossly normal, pupils equal, round, reactive to light and accommodation III,IV, VI: ptosis not present, extra-ocular motions intact bilaterally V,VII: smile symmetric, facial light touch sensation normal bilaterally VIII: hearing normal bilaterally IX,X: uvula rises symmetrically XI: bilateral shoulder shrug XII: midline tongue extension Motor: Right :Upper extremity Refused to move due to painbut atleast 3/5Left: Upper extremity 5/5 Lower extremity Post surgicalbut able to move against gravityLower extremity 5/5 Tone and bulk:normal tone throughout; no atrophy noted Sensory: Pinprick and light touch intact throughout, bilaterally Deep Tendon Reflexes: 1+ and symmetric throughout Plantars: Right: downgoingLeft: downgoing Cerebellar: Would not take part in Exam Gait: not tested  ASSESSMENT/PLAN [name] is a 60 y.o. 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  She did not receive IV t-PA due to recent surgery.  Stroke: Dominant watershed infarcts.  Resultant  right hemiparesis  MRI  Acute superimposed upon chronic left hemisphere watershed infarcts.   MRA  - not performed  EEG - abnormal as noted above  Carotid Doppler - (canceled) refer to CTA of the neck  CTA of the head and neck - pending  2D Echo - pending  LDL - will order  HgbA1c - will order  VTE prophylaxis - Lovenox Diet regular Room service appropriate?: Yes; Fluid consistency:: Thin  No antithrombotic prior to admission, now on No antithrombotic - aspirin allergy  Patient counseled to be compliant with her antithrombotic medications  Ongoing aggressive stroke risk factor management  Therapy recommendations: pending  Disposition: Pending  Hypertension  Stable  Permissive hypertension (OK if < 220/120) but gradually normalize in 5-7 days  Long-term BP goal normotensive  Hyperlipidemia  Home meds:  Crestor 20 mg daily resumed in hospital  LDL pending, goal < 70  Continue statin at discharge    Other Stroke Risk Factors  Cigarette smoker - advised to stop smoking  Hx stroke/TIA   Other Active Problems  UDS - positive for THCU, benzodiazepines, and opiates.  Postop anemia  Psychiatric consult for bizarre behavior - Seroquel increased  History of seizure disorder - EEG abnormal as noted above -> continue Depakote.   PLAN  CT angiogram of head and neck ordered. Carotid Dopplers canceled.  Aspirin 325 mg daily recommended at time  of neurology consult was not initiated. History of aspirin allergy. Spoke with [employer] manager for [employer]. Okay from their standpoint to start Plavix.   Hospital day # [place] PA-C [facility] Pager [phone] 08/02/2015, 4:01 PM I have personally examined this patient, reviewed notes, independently viewed imaging studies, participated in medical decision making and plan  of care. I have made any additions or clarifications directly to the above note. Agree with note above.  She is very uncooperative for detailed exam but presented with right sided weakness following hip surgery due to watershed left hemispheric infarcts in setting of likley post op hypotension with chronic left ICA occlusion. and remains at risk for neurological worsening, recurrent stroke/TIA and needs ongoing stroke evaluation and aggressive risk factor modification.Recommend check Ct angiogram neck and brain to look for string sign versus complete occlusion. Optimise BP and avoid hypotension. I spent a total of 88  Minutesin face to face in clinical consultation, greater than 50% of which was counseling/coordinating care about her stroke risk, prevention and treatment. D/w medical hospitalist.   Antony Contras, MD Medical Director [place] Pager: [phone] 08/02/2015 7:00 PM     To contact Stroke Continuity provider, please refer to [email or web address] After hours, contact General Neurology

## 2015-08-03 LAB — CBC
HCT: 30.8 % — ABNORMAL LOW (ref 36.0–46.0)
HEMOGLOBIN: 9.5 g/dL — AB (ref 12.0–15.0)
MCH: 32.2 pg (ref 26.0–34.0)
MCHC: 30.8 g/dL (ref 30.0–36.0)
MCV: 104.4 fL — ABNORMAL HIGH (ref 78.0–100.0)
Platelets: 241 10*3/uL (ref 150–400)
RBC: 2.95 MIL/uL — ABNORMAL LOW (ref 3.87–5.11)
RDW: 13.4 % (ref 11.5–15.5)
WBC: 5.1 10*3/uL (ref 4.0–10.5)

## 2015-08-03 LAB — LIPID PANEL
CHOLESTEROL: 102 mg/dL (ref 0–200)
HDL: 37 mg/dL — AB (ref >40)
LDL Cholesterol: 32 mg/dL (ref 0–99)
TRIGLYCERIDES: 165 mg/dL — AB (ref <150)
Total CHOL/HDL Ratio: 2.8 RATIO
VLDL: 33 mg/dL (ref 0–40)

## 2015-08-03 LAB — BASIC METABOLIC PANEL
ANION GAP: 11 (ref 5–15)
BUN: 11 mg/dL (ref 6–20)
CALCIUM: 8.7 mg/dL — AB (ref 8.9–10.3)
CO2: 24 mmol/L (ref 22–32)
CREATININE: 0.63 mg/dL (ref 0.44–1.00)
Chloride: 100 mmol/L — ABNORMAL LOW (ref 101–111)
GLUCOSE: 72 mg/dL (ref 65–99)
Potassium: 3.8 mmol/L (ref 3.5–5.1)
Sodium: 135 mmol/L (ref 135–145)

## 2015-08-03 LAB — GLUCOSE, CAPILLARY: Glucose-Capillary: 74 mg/dL (ref 65–99)

## 2015-08-03 MED ORDER — NALOXONE HCL 0.4 MG/ML IJ SOLN
0.4000 mg | INTRAMUSCULAR | Status: DC | PRN
  Administered 2015-08-03: 0.4 mg via INTRAVENOUS
  Filled 2015-08-03: qty 1

## 2015-08-03 NOTE — Significant Event (Signed)
Rapid Response Event Note  Overview:  Called by Rn for patient noted to have increased right arm weakness Time Called: 1423 Arrival Time: 1427 Event Type: Other (Comment)  Initial Focused Assessment:  Called to bedside due to PT attempting to work with her and patient stated her R upper arm and speech was different.  Upon my arrival to patient room, Rn at bedside.  Vitals 134/58, 100%, RR 10 on nasal cannula. Patient very lethargic, pupils pinpoint. Patient is not cooperative with exam, did hold up her right arm but did drift.  R grip is weaker than left.  Patient very tearful and crying out, refusing to answer questions or participate.  Patient was given oxycodone and xanax at 1222.  Skin is warm and dry   Interventions:  Spoke with Dr. Leonie Man and updated.  Called Dr. Cruzita Lederer and updated.  Will give narcan now.   Narcan 0.4mg  IV given.  Dr. Cruzita Lederer at bedside.  Visitors at bedside, patient crying.    Plan of Care (if not transferred):  Rn to monitor  Event Summary:  Rn to call if assistance needed   at      at          [facility], [place]

## 2015-08-03 NOTE — Progress Notes (Signed)
PT Cancellation Note  Patient Details Name: Wanda Griffin MRN: [id number] DOB: 1956-01-19   Cancelled Treatment:    Reason Eval/Treat Not Completed: Patient's level of consciousness. Rapid response currently assessing pt due to decreased arousal level, suspected to be due to pain meds. Pt unable to participate in Rx today. PT to re-attempt tomorrow.   Lorriane Shire 08/03/2015, 3:02 PM

## 2015-08-03 NOTE — Progress Notes (Signed)
STROKE TEAM PROGRESS NOTE   HISTORY OF PRESENT ILLNESS (per record) Wanda Griffin is an 60 y.o. 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 Delfino Lovett on 6/27. After surgery she was noted to possibly have right sided weakness. CT scan was notable for possible watershed infarct. Patient initially refused MRI brain. This will be reattempted. On consultation she is very depressed and labile, crying when asked the date and year. She quickly responds to questions with "I do not know, and please do not ask me". She refuses to take part in both exam and consult questions.    SUBJECTIVE (INTERVAL HISTORY) No family members present. The patient is more alert and interactive but tearful and depressed stating she is scared her husband will leave her.. CTA confirms left carotid occlusion in neck and patent left subclavian stent OBJECTIVE Temp:  [98.5 F (36.9 C)-99 F (37.2 C)] 98.5 F (36.9 C) (07/01 2109) Pulse Rate:  [83-87] 87 (07/01 2109) Cardiac Rhythm:  [-]  Resp:  [16] 16 (07/01 1415) BP: (117-121)/(34-38) 117/38 mmHg (07/01 2109) SpO2:  [95 %-100 %] 95 % (07/01 2109)  CBC:  Recent Labs Lab 07/27/15 1712  08/01/15 0744 08/03/15 0419  WBC 7.2  < > 5.3 5.1  NEUTROABS 5.0  --   --   --   HGB 12.4  < > 9.3* 9.5*  HCT 40.2  < > 30.3* 30.8*  MCV 105.2*  < > 107.1* 104.4*  PLT 255  < > 211 241  < > = values in this interval not displayed.  Basic Metabolic Panel:   Recent Labs Lab 08/01/15 0744 08/03/15 0419  NA 141 135  K 4.0 3.8  CL 105 100*  CO2 27 24  GLUCOSE 78 72  BUN 14 11  CREATININE 0.68 0.63  CALCIUM 8.9 8.7*    Lipid Panel:     Component Value Date/Time   CHOL 102 08/03/2015 0419    TRIG 165* 08/03/2015 0419   HDL 37* 08/03/2015 0419   CHOLHDL 2.8 08/03/2015 0419   VLDL 33 08/03/2015 0419   LDLCALC 32 08/03/2015 0419   HgbA1c: No results found for: HGBA1C Urine Drug Screen:     Component Value Date/Time   LABOPIA POSITIVE* 05/23/2012 1359   COCAINSCRNUR NONE DETECTED 05/23/2012 1359   LABBENZ POSITIVE* 05/23/2012 1359   AMPHETMU NONE DETECTED 05/23/2012 1359   THCU POSITIVE* 05/23/2012 1359   LABBARB NONE DETECTED 05/23/2012 1359      IMAGING  Ct Head Wo Contrast 08/01/2015   1. Low-attenuation in the left corona radiata involving the frontal and parietal lobes with mild overlying cortical involvement in the frontal and posterior aspects. 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 While chronicity can be difficult to determine with certainty on CT imaging, I would favor this is chronic. If there is concern based on symptoms, an MRI could better assess the age of the finding.  2. No other acute abnormalities.    Mr Brain Wo Contrast 08/01/2015   Patient did not complete entire MR examination. Images are motion degraded.  Acute superimposed upon chronic left hemisphere watershed infarcts.  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  Decreased flow within left  middle cerebral artery.  Post bilateral craniotomies.  No acute intracranial hemorrhage identified. Anterior left temporal lobe cystic encephalomalacia. This may reflect result of resection of tumor per history provided or secondary to prior trauma. If there were any progressive symptoms felt to be unrelated to acute infarcts than this could be further evaluated on follow-up contrast-enhanced MR (may require sedation).  CT angio neck: Chronic LEFT ICA occlusion.RIGHT vertebral dominant but both patent. Patent LEFT subclavian Stent. No extracranial or intracranial stenosis related to the RIGHT anterior  circulation or posterior circulation EEG 08/01/2015 This is an abnormal EEG. There is evidence of moderate generalized slowing of brain activity more pronounced in the left temporal area. This is non-specific but may be due to toxic, metabolic, hypoxic-ischemic, or infectious etiologies as some examples. A local structural process in the left temporal lobe needs to be ruled out as well. The patient is not in non-convulsive status epilepticus. Clinical correlation is recommended.     PHYSICAL EXAM  Elderly frail Caucasian lady who is tearful and crying . Afebrile. Head is nontraumatic. Neck is supple without bruit.    Cardiac exam no murmur or gallop. Lungs are clear to auscultation. Distal pulses are well felt. Right leg movements limited by pain from recent surgery and bandages.  Neurological Exam :  Alert, oriented to hospital, June and year, labile and depressed mood. Speech fluent without evidence of aphasia. Able to follow simple commands if desires to. Cranial Nerves: II: Visual fields grossly normal, pupils equal, round, reactive to light and accommodation III,IV, VI: ptosis not present, extra-ocular motions intact bilaterally V,VII: smile symmetric, facial light touch sensation normal bilaterally VIII: hearing normal bilaterally IX,X: uvula rises symmetrically XI: bilateral shoulder shrug XII: midline tongue extension Motor: Right :Upper extremity mild weakness 4//5Left: Upper extremity 5/5 Lower extremity Post surgicalbut able to move against gravity2-3/5 atleast.Lower extremity 5/5 Tone and bulk:normal tone throughout; no atrophy noted Sensory: Pinprick and light touch intact throughout, bilaterally Deep Tendon Reflexes: 1+ and symmetric throughout Plantars: Right: downgoingLeft: downgoing Cerebellar: Would not  take part in Exam Gait: not tested  ASSESSMENT/PLAN [name] is a 60 y.o. 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 She did not receive IV t-PA due to recent surgery.  Stroke: Dominant watershed infarcts.  Resultant  right hemiparesis  MRI  Acute superimposed upon chronic left hemisphere watershed infarcts.   MRA  - not performed  EEG - abnormal as noted above  Carotid Doppler - (canceled) refer to CTA of the neck  CTA of the head and neck - pending  2D Echo - pending  LDL - will order  HgbA1c - will order  VTE prophylaxis - Lovenox Diet regular Room service appropriate?: Yes; Fluid consistency:: Thin  No antithrombotic prior to admission, now on No antithrombotic - aspirin allergy  Patient counseled to be compliant with her antithrombotic medications  Ongoing aggressive stroke risk factor management  Therapy recommendations: pending  Disposition: Pending  Hypertension  Stable  Permissive hypertension (OK if < 220/120) but gradually normalize in 5-7 days  Long-term BP goal normotensive  Hyperlipidemia  Home meds:  Crestor 20 mg daily resumed in hospital  LDL pending, goal < 70  Continue statin at discharge    Other Stroke Risk Factors  Cigarette smoker - advised to stop smoking  Hx stroke/TIA  Other Active Problems  UDS - positive for THCU, benzodiazepines, and opiates.  Postop anemia  Psychiatric consult for bizarre behavior - Seroquel increased  History of seizure disorder - EEG abnormal as noted above -> continue Depakote.   PLAN  Plavix for stroke prevention and avoid hypotension. Mobilize oob as tolerated with therapy consults.Resume home meds and primary team to address depression.  Hospital day # 7    I have personally examined this patient, reviewed notes, independently viewed imaging studies, participated in medical decision making and plan of care. I have made any additions or clarifications directly to the above note. Agree with note above.  She i  presented with right sided weakness following hip surgery due to watershed left hemispheric infarcts in setting of likley post op hypotension with chronic left ICA occlusion.  .Recommend  Continue Plavix. Optimise BP and avoid hypotension. I spent a total of 25  Minutesin face to face in clinical consultation, greater than 50% of which was counseling/coordinating care about her stroke risk, prevention and treatment. D/w medical hospitalist.Stroke team will sign off Call for questions.   Antony Contras, MD Medical Director [facility] Pager: [phone] 08/03/2015 12:28 PM     To contact Stroke Continuity provider, please refer to [email or web address] After hours, contact General Neurology

## 2015-08-03 NOTE — Progress Notes (Signed)
Occupational Therapy Treatment Patient Details Name: Wanda Griffin MRN: [id number] DOB: March 21, 1955 Today's Date: 08/03/2015    History of present illness pt rpesents with R Femur fx s/p IM nail.  pt with hx of Brain Tumor Resection followed by SDH, Seizures, Anxiety, Depression, and COPD.     OT comments  Neurology asking for therpy to see pt. Nsg premedicated pt.Difficult time arousing pt. Pt initially with minimal movement RUE/flaccid. Pt stated RUE felt different and speech was difficult to understand. Attempted bed level ADL, but unable to maintain arousal. Nsg called to assess pt due to status change. Rapid response called. Will follow up tomorrow and treat appropriately.   Follow Up Recommendations  SNF;Supervision/Assistance - 24 hour    Equipment Recommendations       Recommendations for Other Services      Precautions / Restrictions Precautions Precautions: Fall Restrictions RLE Weight Bearing: Partial weight bearing RLE Partial Weight Bearing Percentage or Pounds: 30       Mobility Bed Mobility Overal bed mobility: Needs Assistance Bed Mobility: Rolling Rolling: Max assist            Transfers                      Balance                                   ADL Overall ADL's : Needs assistance/impaired                                       General ADL Comments: total assist, pt not able to remain alert      Vision    unable to keep eyes open                 Perception     Praxis      Cognition   Behavior During Therapy: Flat affect Overall Cognitive Status: No family/caregiver present to determine baseline cognitive functioning   Orientation Level: Disoriented to;Time Current Attention Level: Sustained    Following Commands: Follows one step commands with increased time Safety/Judgement: Decreased awareness of safety;Decreased awareness of deficits   Problem Solving: Slow  processing;Decreased initiation;Difficulty sequencing;Requires verbal cues;Requires tactile cues      Extremity/Trunk Assessment   RUE weakness. Baseline weakness but unsure of movement. Pt @ 2/5 throughout with the exception of absent in hand manipulation skills. Gross grasp/release only. Neuro note from 12:38 today stated 4/5            Exercises     Shoulder Instructions       General Comments  Pt labile stating she didn't want her husband to leave her.    Pertinent Vitals/ Pain       Pain Assessment: Faces Faces Pain Scale: Hurts even more Pain Location: RLE Pain Descriptors / Indicators: Crying;Discomfort;Grimacing;Guarding Pain Intervention(s): Limited activity within patient's tolerance  Home Living                                          Prior Functioning/Environment              Frequency Min 2X/week     Progress Toward Goals  OT Goals(current goals can now be  found in the care plan section)  Progress towards OT goals: Not progressing toward goals - comment (level of lethargy)  Acute Rehab OT Goals Patient Stated Goal: Home OT Goal Formulation: With patient Time For Goal Achievement: 08/14/15 Potential to Achieve Goals: Fair ADL Goals Pt Will Perform Eating: with set-up;sitting Pt Will Perform Grooming: with set-up;sitting Pt Will Perform Upper Body Bathing: sitting;with min assist Pt Will Perform Upper Body Dressing: sitting;with min assist Pt Will Transfer to Toilet: with mod assist;stand pivot transfer;bedside commode Pt Will Perform Toileting - Clothing Manipulation and hygiene: with mod assist;sitting/lateral leans Additional ADL Goal #1: Pt will perform bed mobility with min assist in preparation for ADL a EOB. Additional ADL Goal #2: Pt will follow 1 step directions consistently.  Plan Discharge plan remains appropriate    Co-evaluation                 End of Session     Activity Tolerance Patient limited by  lethargy   Patient Left in bed;with call bell/phone within reach;with nursing/sitter in room   Nurse Communication Mobility status;Other (comment) (pt's level of lethargy)        Time: [id number] OT Time Calculation (min): 17 min  Charges: OT General Charges $OT Visit: 1 Procedure OT Treatments $Therapeutic Activity: 8-22 mins  Wanda Griffin,HILLARY 08/03/2015, 2:33 PM  [facility], OTR/L  [phone] 08/03/2015

## 2015-08-03 NOTE — Progress Notes (Signed)
PROGRESS NOTE  Wanda Griffin [id number] DOB: May 16, 1955 DOA: 07/27/2015 PCP: [name], MD   LOS: 7 days   Brief Narrative: 60 y.o. 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 Delfino Lovett on 6/27  Assessment & Plan: Principal Problem:   Anxiety and depression Active Problems:   Seizure disorder ([place])   Femur fracture (H[facility])   Tobacco abuse   Macrocytosis without anemia   Hyperlipidemia   Chronic constipation   COPD (chronic obstructive pulmonary disease) (H[facility])   Femur fracture, right (H[facility])   Femur fracture, right, closed, initial encounter   Malnutrition of moderate degree   CVA (cerebral vascular accident) ([place])   Other depression due to general medical condition  CVA - patient with progressive lethargy following surgery, narcotics discontinued initially, ABG with chronic hypercarbia without acidosis, she had a CT head which showed low-attenuation in the left corona radiata involving the frontal and parietal lobes with mild overlying cortical involvement in the frontal and posterior aspects, probably present in 2014 per radiology favoring chronicity. Neurology was consulted. MRI confirmed acute superimposed upon chronic left hemisphere watershed infarcts - Echo pending - on Plavix per neurology  Proximal femur fracture, right  - Secondary to fall, orthopedic consult greatly appreciated, Status post surgical repair by Dr. Lyla Glassing on 6/26. - PT consulted, recommending SNF, patient now agreeable. SW consulted - placement pending  Acute encephalopathy / lethargy - may be related to #1, however there is a strong psych component as well - minimize narcotics - ABG with mild chronic  respiratory acidosis, compensated - patient improved overall, more appropriate once her seroquel was adjusted per psychiatry  - another episode of lethargy today after receiving Xanax and Pain medication at the same time, rapid called by RN. Patient re-evaluated in pm, no new focal deficits, received Narcan, awake, crying.   UTI - received ciprofloxacin. Discontinued as cultures negative  Anxiety and depression  - Continue current management with Seroquel and prn Xanax  - patient with child like behavior and refusing to participate in physical exam for 3 days, with episodes of crying. Psychiatry consulted, appreciate input, Seroquel adjusted.  - improved yesterday however again today with crying spells  Seizure disorder  - Likely secondary to remote brain tumor s/p resection  - Pt reports no recent seizure  - Continue current management with Depakote   COPD  - Continue ICS/LABA with Dulera while in hospital  - DuoNeb q4h prn SOB or wheezing   Hyperlipidemia  - Continue Crestor   Tobacco abuse  - Nicotine patch available   Macrocytosis  - MCV 105.2 without anemia  - B 12 normal, but on the lower side, received supplement for 3 days IM injections, and discharged on by mouth    DVT prophylaxis: Lovenox Code Status: Full Family Communication: no family bedside Disposition Plan: SNF 2-3 days  Consultants:   Orthopedic surgery  Neurology  Psychiatry    Procedures:   None   Antimicrobials:  Ciprofloxacin << 6/30  Subjective: - alert and appropriate this morning, episode of lethargy in the afternoon, improved  Objective: Filed Vitals:   08/02/15 0618 08/02/15 1415 08/02/15 2109 08/03/15 1427  BP: 132/50 121/34 117/38 134/58  Pulse: 90 83 87 82  Temp: 99 F (37.2 C) 99 F (37.2  C) 98.5 F (36.9 C) 98.6 F (37 C)  TempSrc: Oral Oral Oral Oral  Resp: 16 16  12   SpO2: 100% 100% 95% 99%    Intake/Output Summary (Last 24 hours) at 08/03/15  1457 Last data filed at 08/03/15 1006  Gross per 24 hour  Intake      0 ml  Output    325 ml  Net   -325 ml   Examination: Constitutional: NAD  Filed Vitals:   08/02/15 0618 08/02/15 1415 08/02/15 2109 08/03/15 1427  BP: 132/50 121/34 117/38 134/58  Pulse: 90 83 87 82  Temp: 99 F (37.2 C) 99 F (37.2 C) 98.5 F (36.9 C) 98.6 F (37 C)  TempSrc: Oral Oral Oral Oral  Resp: 16 16  12   SpO2: 100% 100% 95% 99%   Eyes: PERRL, lids and conjunctivae normal Respiratory: clear to auscultation bilaterally, no wheezing, no crackles. Normal respiratory effort.  Cardiovascular: Regular rate and rhythm, no murmurs / rubs / gallops. No LE edema. 2+ pedal pulses. Abdomen: no tenderness. Bowel sounds positive.  Musculoskeletal: no clubbing / cyanosis.  Neurologic: intermittently participating limited by generalized pain  Data Reviewed: I have personally reviewed following labs and imaging studies  CBC:  Recent Labs Lab 07/27/15 1712  07/29/15 0528 07/30/15 0340 07/31/15 0317 08/01/15 0744 08/03/15 0419  WBC 7.2  < > 7.7 7.3 6.6 5.3 5.1  NEUTROABS 5.0  --   --   --   --   --   --   HGB 12.4  < > 10.3* 9.2* 10.0* 9.3* 9.5*  HCT 40.2  < > 32.2* 29.3* 31.8* 30.3* 30.8*  MCV 105.2*  < > 105.6* 104.3* 107.4* 107.1* 104.4*  PLT 255  < > 175 167 195 211 241  < > = values in this interval not displayed. Basic Metabolic Panel:  Recent Labs Lab 07/29/15 0528 07/30/15 0340 07/31/15 0317 08/01/15 0744 08/03/15 0419  NA 138 141 141 141 135  K 4.1 3.7 4.4 4.0 3.8  CL 101 104 102 105 100*  CO2 30 28 30 27 24   GLUCOSE 119* 103* 82 78 72  BUN <5* <5* 13 14 11   CREATININE 0.87 0.63 0.71 0.68 0.63  CALCIUM 9.1 8.9 9.1 8.9 8.7*   GFR: CrCl cannot be calculated (Unknown ideal weight.). Liver Function Tests:  Recent Labs Lab 07/27/15 1712  AST 18  ALT 12*  ALKPHOS 78  BILITOT 0.1*  PROT 6.2*  ALBUMIN 3.4*   No results for input(s): LIPASE, AMYLASE in the last 168  hours.  Recent Labs Lab 07/31/15 1540  AMMONIA 47*   Coagulation Profile:  Recent Labs Lab 07/27/15 1712  INR 0.98   Urine analysis:    Component Value Date/Time   COLORURINE YELLOW 07/28/2015 1027   APPEARANCEUR CLEAR 07/28/2015 1027   LABSPEC 1.011 07/28/2015 1027   PHURINE 6.0 07/28/2015 1027   GLUCOSEU NEGATIVE 07/28/2015 1027   HGBUR TRACE* 07/28/2015 1027   [place] 07/28/2015 1027   [place] 07/28/2015 1027   PROTEINUR NEGATIVE 07/28/2015 1027   UROBILINOGEN 1.0 05/23/2012 1359   NITRITE NEGATIVE 07/28/2015 1027   LEUKOCYTESUR MODERATE* 07/28/2015 1027   Sepsis Labs: Invalid input(s): PROCALCITONIN, LACTICIDVEN  Recent Results (from the past 240 hour(s))  MRSA PCR Screening     Status: None   Collection Time: 07/27/15 11:19 PM  Result Value Ref Range Status   MRSA by PCR NEGATIVE NEGATIVE Final    Comment:        The  GeneXpert MRSA Assay (FDA approved for NASAL specimens only), is one component of a comprehensive MRSA colonization surveillance program. It is not intended to diagnose MRSA infection nor to guide or monitor treatment for MRSA infections.   Urine culture     Status: None   Collection Time: 07/28/15 10:27 AM  Result Value Ref Range Status   Specimen Description URINE, CATHETERIZED  Final   Special Requests NONE  Final   Culture NO GROWTH  Final   Report Status 07/29/2015 FINAL  Final      Radiology Studies: Ct Angio Head W Or Wo Contrast  08/02/2015  CLINICAL DATA:  Patient with RIGHT-sided weakness after repair of RIGHT femur fracture. Recent fall 5 days ago. Increasing lethargy. EXAM: CT ANGIOGRAPHY HEAD AND NECK TECHNIQUE: Multidetector CT imaging of the head and neck was performed using the standard protocol during bolus administration of intravenous contrast. Multiplanar CT image reconstructions and MIPs were obtained to evaluate the vascular anatomy. Carotid stenosis measurements (when applicable) are obtained  utilizing NASCET criteria, using the distal internal carotid diameter as the denominator. CONTRAST:  50 mL Isovue 370. COMPARISON:  MR brain 08/01/2015 FINDINGS: According to technologist note, patient was extremely tearful, and unable to tolerate the entire exam. Post infusion imaging of the brain was truncated. CT HEAD Calvarium and skull base: Mastoids and middle ears are grossly clear. BILATERAL craniotomies appear uncomplicated. Previous mandibular fracture ORIF. The patient is edentulous. Paranasal sinuses: Imaged portions are clear. Orbits: Negative. Brain: Chronic LEFT hemisphere watershed infarcts are redemonstrated. Acute areas of cytotoxic edema better seen on MR. LEFT temporal encephalomalacia. This could reflect resection of tumor or prior trauma. No evidence for hemorrhagic transformation. No mass or hydrocephalus. No recurrent extra-axial collections. CTA NECK Aortic arch: Standard branching. Imaged portion shows no evidence of aneurysm or dissection. No significant stenosis of the major arch vessel origins. LEFT subclavian stent is patent. Right carotid system: Non stenotic atheromatous change. No evidence of dissection, stenosis (50% or greater) or occlusion. Left carotid system: Occlusion of the LEFT internal carotid artery at its origin. No significant reconstitution or string sign. Vertebral arteries: RIGHT vertebral dominant but both patent. No narrowing in the neck. No evidence of dissection, stenosis (50% or greater) or occlusion. CTA HEAD Anterior circulation: Reconstitution of the LEFT anterior circulation appears be primarily RIGHT-to-LEFT cross fill via the anterior communicating. LEFT posterior communicating artery is diminutive. The LEFT M1 MCA is small, without focal stenosis. No significant stenosis, proximal occlusion, aneurysm, or vascular malformation. Posterior circulation: Dominant RIGHT vertebral. Both are patent. No posterior circulation stenosis. No significant stenosis,  proximal occlusion, aneurysm, or vascular malformation. Venous sinuses: As permitted by contrast timing, patent. Anatomic variants: None of significance. Delayed phase: Patient asked to be removed from the CT table before this part of the exam was completed. IMPRESSION: Chronic LEFT ICA occlusion. Acute on chronic LEFT hemisphere watershed infarctions. LEFT temporal encephalomalacia related either to previous trauma or stroke. RIGHT vertebral dominant but both patent. Patent LEFT subclavian stent. No extracranial or intracranial stenosis related to the RIGHT anterior circulation or posterior circulation. Electronically Signed   By: Staci Righter M.D.   On: 08/02/2015 18:21   Ct Angio Neck W Or Wo Contrast  08/02/2015  CLINICAL DATA:  Patient with RIGHT-sided weakness after repair of RIGHT femur fracture. Recent fall 5 days ago. Increasing lethargy. EXAM: CT ANGIOGRAPHY HEAD AND NECK TECHNIQUE: Multidetector CT imaging of the head and neck was performed using the standard protocol during bolus administration of intravenous  contrast. Multiplanar CT image reconstructions and MIPs were obtained to evaluate the vascular anatomy. Carotid stenosis measurements (when applicable) are obtained utilizing NASCET criteria, using the distal internal carotid diameter as the denominator. CONTRAST:  50 mL Isovue 370. COMPARISON:  MR brain 08/01/2015 FINDINGS: According to technologist note, patient was extremely tearful, and unable to tolerate the entire exam. Post infusion imaging of the brain was truncated. CT HEAD Calvarium and skull base: Mastoids and middle ears are grossly clear. BILATERAL craniotomies appear uncomplicated. Previous mandibular fracture ORIF. The patient is edentulous. Paranasal sinuses: Imaged portions are clear. Orbits: Negative. Brain: Chronic LEFT hemisphere watershed infarcts are redemonstrated. Acute areas of cytotoxic edema better seen on MR. LEFT temporal encephalomalacia. This could reflect resection  of tumor or prior trauma. No evidence for hemorrhagic transformation. No mass or hydrocephalus. No recurrent extra-axial collections. CTA NECK Aortic arch: Standard branching. Imaged portion shows no evidence of aneurysm or dissection. No significant stenosis of the major arch vessel origins. LEFT subclavian stent is patent. Right carotid system: Non stenotic atheromatous change. No evidence of dissection, stenosis (50% or greater) or occlusion. Left carotid system: Occlusion of the LEFT internal carotid artery at its origin. No significant reconstitution or string sign. Vertebral arteries: RIGHT vertebral dominant but both patent. No narrowing in the neck. No evidence of dissection, stenosis (50% or greater) or occlusion. CTA HEAD Anterior circulation: Reconstitution of the LEFT anterior circulation appears be primarily RIGHT-to-LEFT cross fill via the anterior communicating. LEFT posterior communicating artery is diminutive. The LEFT M1 MCA is small, without focal stenosis. No significant stenosis, proximal occlusion, aneurysm, or vascular malformation. Posterior circulation: Dominant RIGHT vertebral. Both are patent. No posterior circulation stenosis. No significant stenosis, proximal occlusion, aneurysm, or vascular malformation. Venous sinuses: As permitted by contrast timing, patent. Anatomic variants: None of significance. Delayed phase: Patient asked to be removed from the CT table before this part of the exam was completed. IMPRESSION: Chronic LEFT ICA occlusion. Acute on chronic LEFT hemisphere watershed infarctions. LEFT temporal encephalomalacia related either to previous trauma or stroke. RIGHT vertebral dominant but both patent. Patent LEFT subclavian stent. No extracranial or intracranial stenosis related to the RIGHT anterior circulation or posterior circulation. Electronically Signed   By: Staci Righter M.D.   On: 08/02/2015 18:21   Mr Brain Wo Contrast  08/01/2015  CLINICAL DATA:  60 year old  with remote history of brain tumor status post resection and subsequent seizure disorder. History subdural hematomas. Remote ovarian cancer. Subclavian steal syndrome. Recent femur fracture. Post surgery 07/29/2015 patient developed right-sided weakness. Abnormal CT. Subsequent encounter. EXAM: MRI HEAD WITHOUT CONTRAST TECHNIQUE: Multiplanar, multiecho pulse sequences of the brain and surrounding structures were obtained without intravenous contrast. COMPARISON:  07/31/2015 and 05/23/2012 head CT. No comparison brain MR. FINDINGS: Patient did not complete entire MR examination. Acute superimposed upon chronic left hemisphere watershed infarcts. 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 Decreased flow within left middle cerebral artery. Post bilateral craniotomies. No acute intracranial hemorrhage identified. Anterior left temporal lobe cystic encephalomalacia. This may reflect result of resection of tumor per history provided or secondary to prior trauma. There are no comparison MR scans to confirm stability of this appearance. If there were any progressive symptoms felt to be unrelated to acute infarcts than this could be further evaluated on follow-up contrast-enhanced MR (may require sedation). Global atrophy greater left hemisphere.  No hydrocephalus. IMPRESSION: Patient did not complete entire MR examination. Images are motion degraded. Acute superimposed upon chronic left hemisphere watershed infarcts. 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 Decreased flow within left middle cerebral artery. Post bilateral craniotomies. No acute intracranial hemorrhage identified. Anterior left temporal lobe cystic encephalomalacia. This may reflect result of resection of tumor per history provided or secondary to prior trauma. Follow-up as noted above. Electronically Signed   By: Genia Del M.D.   On: 08/01/2015 16:28     Scheduled Meds: . clopidogrel  75 mg Oral Daily  . divalproex  500 mg Oral TID  . docusate  sodium  100 mg Oral BID  . enoxaparin (LOVENOX) injection  40 mg Subcutaneous Q24H  . feeding supplement (ENSURE ENLIVE)  237 mL Oral BID BM  . lubiprostone  24 mcg Oral BID WC  . mometasone-formoterol  2 puff Inhalation BID  . QUEtiapine  25 mg Oral BID  . rosuvastatin  20 mg Oral Daily  . senna  1 tablet Oral BID   Continuous Infusions: . sodium chloride 50 mL/hr at 08/03/15 1206     Marzetta Board, MD, PhD [facility] Pager [phone]  If 7PM-7AM, please contact night-coverage [email or web address] Password [email or web address] 08/03/2015, 2:57 PM

## 2015-08-04 ENCOUNTER — Inpatient Hospital Stay (HOSPITAL_COMMUNITY): Payer: Medicare Other

## 2015-08-04 DIAGNOSIS — G40909 Epilepsy, unspecified, not intractable, without status epilepticus: Secondary | ICD-10-CM | POA: Diagnosis not present

## 2015-08-04 DIAGNOSIS — E785 Hyperlipidemia, unspecified: Secondary | ICD-10-CM | POA: Diagnosis not present

## 2015-08-04 DIAGNOSIS — F419 Anxiety disorder, unspecified: Secondary | ICD-10-CM | POA: Diagnosis not present

## 2015-08-04 DIAGNOSIS — S72001D Fracture of unspecified part of neck of right femur, subsequent encounter for closed fracture with routine healing: Secondary | ICD-10-CM | POA: Diagnosis not present

## 2015-08-04 DIAGNOSIS — F329 Major depressive disorder, single episode, unspecified: Secondary | ICD-10-CM | POA: Diagnosis not present

## 2015-08-04 DIAGNOSIS — K5900 Constipation, unspecified: Secondary | ICD-10-CM | POA: Diagnosis not present

## 2015-08-04 DIAGNOSIS — R4182 Altered mental status, unspecified: Secondary | ICD-10-CM | POA: Diagnosis not present

## 2015-08-04 DIAGNOSIS — Z741 Need for assistance with personal care: Secondary | ICD-10-CM | POA: Diagnosis not present

## 2015-08-04 DIAGNOSIS — I6990 Unspecified sequelae of unspecified cerebrovascular disease: Secondary | ICD-10-CM | POA: Diagnosis not present

## 2015-08-04 DIAGNOSIS — R278 Other lack of coordination: Secondary | ICD-10-CM | POA: Diagnosis not present

## 2015-08-04 DIAGNOSIS — R41841 Cognitive communication deficit: Secondary | ICD-10-CM | POA: Diagnosis not present

## 2015-08-04 DIAGNOSIS — F29 Unspecified psychosis not due to a substance or known physiological condition: Secondary | ICD-10-CM | POA: Diagnosis not present

## 2015-08-04 DIAGNOSIS — R262 Difficulty in walking, not elsewhere classified: Secondary | ICD-10-CM | POA: Diagnosis not present

## 2015-08-04 DIAGNOSIS — I6789 Other cerebrovascular disease: Secondary | ICD-10-CM | POA: Diagnosis not present

## 2015-08-04 DIAGNOSIS — F418 Other specified anxiety disorders: Secondary | ICD-10-CM | POA: Diagnosis not present

## 2015-08-04 DIAGNOSIS — F39 Unspecified mood [affective] disorder: Secondary | ICD-10-CM | POA: Diagnosis not present

## 2015-08-04 DIAGNOSIS — Z4789 Encounter for other orthopedic aftercare: Secondary | ICD-10-CM | POA: Diagnosis not present

## 2015-08-04 DIAGNOSIS — M6281 Muscle weakness (generalized): Secondary | ICD-10-CM | POA: Diagnosis not present

## 2015-08-04 DIAGNOSIS — G9340 Encephalopathy, unspecified: Secondary | ICD-10-CM | POA: Diagnosis not present

## 2015-08-04 DIAGNOSIS — E46 Unspecified protein-calorie malnutrition: Secondary | ICD-10-CM | POA: Diagnosis not present

## 2015-08-04 DIAGNOSIS — S72361D Displaced segmental fracture of shaft of right femur, subsequent encounter for closed fracture with routine healing: Secondary | ICD-10-CM | POA: Diagnosis not present

## 2015-08-04 DIAGNOSIS — S7291XD Unspecified fracture of right femur, subsequent encounter for closed fracture with routine healing: Secondary | ICD-10-CM | POA: Diagnosis not present

## 2015-08-04 DIAGNOSIS — R569 Unspecified convulsions: Secondary | ICD-10-CM | POA: Diagnosis not present

## 2015-08-04 DIAGNOSIS — K589 Irritable bowel syndrome without diarrhea: Secondary | ICD-10-CM | POA: Diagnosis not present

## 2015-08-04 DIAGNOSIS — F339 Major depressive disorder, recurrent, unspecified: Secondary | ICD-10-CM | POA: Diagnosis not present

## 2015-08-04 DIAGNOSIS — J449 Chronic obstructive pulmonary disease, unspecified: Secondary | ICD-10-CM | POA: Diagnosis not present

## 2015-08-04 DIAGNOSIS — Z87898 Personal history of other specified conditions: Secondary | ICD-10-CM | POA: Diagnosis not present

## 2015-08-04 DIAGNOSIS — I639 Cerebral infarction, unspecified: Secondary | ICD-10-CM | POA: Diagnosis not present

## 2015-08-04 DIAGNOSIS — S728X9A Other fracture of unspecified femur, initial encounter for closed fracture: Secondary | ICD-10-CM | POA: Diagnosis not present

## 2015-08-04 LAB — ECHOCARDIOGRAM COMPLETE
AVLVOTPG: 6 mmHg
CHL CUP MV DEC (S): 225
E/e' ratio: 6.43
EWDT: 225 ms
FS: 30 % (ref 28–44)
IV/PV OW: 1.15
LA diam end sys: 28 mm
LA vol A4C: 15.4 ml
LA vol index: 16.7 mL/m2
LA vol: 24.1 mL
LADIAMINDEX: 1.94 cm/m2
LASIZE: 28 mm
LV E/e'average: 6.43
LV e' LATERAL: 11.3 cm/s
LVEEMED: 6.43
LVOT VTI: 15.3 cm
LVOT area: 2.01 cm2
LVOT diameter: 16 mm
LVOT peak vel: 119 cm/s
LVOTSV: 31 mL
MV Peak grad: 2 mmHg
MV pk A vel: 88.6 m/s
MVPKEVEL: 72.7 m/s
PW: 8.39 mm — AB (ref 0.6–1.1)
RV TAPSE: 18.1 mm
TDI e' lateral: 11.3
TDI e' medial: 7.29

## 2015-08-04 LAB — HEMOGLOBIN A1C
Hgb A1c MFr Bld: 5.2 % (ref 4.8–5.6)
Mean Plasma Glucose: 103 mg/dL

## 2015-08-04 LAB — CREATININE, SERUM
CREATININE: 0.63 mg/dL (ref 0.44–1.00)
GFR calc Af Amer: >60 mL/min (ref >60)

## 2015-08-04 MED ORDER — OXYCODONE-ACETAMINOPHEN 5-325 MG PO TABS: 1.0000 | ORAL_TABLET | Freq: Three times a day (TID) | ORAL | Status: DC | PRN

## 2015-08-04 MED ORDER — ENOXAPARIN SODIUM 40 MG/0.4ML ~~LOC~~ SOLN: 40.0000 mg | SUBCUTANEOUS | Status: DC

## 2015-08-04 MED ORDER — QUETIAPINE FUMARATE 25 MG PO TABS: 25.0000 mg | ORAL_TABLET | Freq: Two times a day (BID) | ORAL | Status: AC

## 2015-08-04 MED ORDER — ALPRAZOLAM 0.25 MG PO TABS: 0.2500 mg | ORAL_TABLET | Freq: Two times a day (BID) | ORAL | Status: DC | PRN

## 2015-08-04 MED ORDER — CLOPIDOGREL BISULFATE 75 MG PO TABS: 75.0000 mg | ORAL_TABLET | Freq: Every day | ORAL | Status: AC

## 2015-08-04 NOTE — Progress Notes (Signed)
  Echocardiogram 2D Echocardiogram has been performed.  Darlina Sicilian M 08/04/2015, 2:05 PM

## 2015-08-04 NOTE — Clinical Social Work Note (Signed)
Patient to be discharged to [facility]. Patient to be transported via EMS. RN report number: [place], [place] [facility]: [phone] Surgical: [phone]

## 2015-08-04 NOTE — Progress Notes (Signed)
Occupational Therapy Treatment Patient Details Name: Wanda Griffin MRN: [id number] DOB: 11-May-1955 Today's Date: 08/04/2015    History of present illness pt presents with R Femur fx s/p IM nail.  pt with hx of Brain Tumor Resection followed by SDH, Seizures, Anxiety, Depression, and COPD.     OT comments  Pt making progress with functional goals. Pt able to get to OOB, SPT to [facility]. Pt fixated on finding her reading glasses that she insists are under the recliner and on the floor next to tray table although they were not. Pt possibly d/c to SNF today. OT will continue to follow  Follow Up Recommendations  SNF;Supervision/Assistance - 24 hour    Equipment Recommendations  Other (comment) (TBD at next level of care)    Recommendations for Other Services      Precautions / Restrictions Precautions Precautions: Fall Restrictions Weight Bearing Restrictions: Yes RLE Weight Bearing: Partial weight bearing RLE Partial Weight Bearing Percentage or Pounds: 30       Mobility Bed Mobility Overal bed mobility: Needs Assistance Bed Mobility: Supine to Sit     Supine to sit: Mod assist;HOB elevated Sit to supine: Mod assist   General bed mobility comments: cues to use rail to assist, using bed pad to assist hips to EOB  Transfers Overall transfer level: Needs assistance Equipment used: Rolling walker (2 wheeled) Transfers: Sit to/from [facility] Sit to Stand: +2 physical assistance;Mod assist Stand pivot transfers: +2 physical assistance;Mod assist       General transfer comment: bed to [facility]    Balance   Sitting-balance support: Feet supported;No upper extremity supported Sitting balance-Leahy Scale: Fair     Standing balance support: Bilateral upper extremity supported;During functional activity                       ADL Overall ADL's : Needs assistance/impaired     Grooming: Wash/dry hands;Wash/dry face;Sitting;Min guard   Upper Body  Bathing: Min guard;Sitting Upper Body Bathing Details (indicate cue type and reason): simulated     Upper Body Dressing : Sitting;Min guard       Toilet Transfer: RW;BSC;Stand-pivot;Cueing for sequencing;Cueing for safety;+2 for physical assistance;Moderate assistance   Toileting- Clothing Manipulation and Hygiene: Total assistance;Sit to/from stand       Functional mobility during ADLs: Moderate assistance;Cueing for safety;Cueing for sequencing;+2 for physical assistance;Rolling walker        Vision  no change from baseline                              Cognition   Behavior During Therapy: [facility] for tasks assessed/performed Overall Cognitive Status: No family/caregiver present to determine baseline cognitive functioning     Current Attention Level: Divided (fixated on finding reading glasses and believes that she is seeing them on the floor in her room although they were not there)    Following Commands: Follows one step commands consistently Safety/Judgement: Decreased awareness of safety   Problem Solving: Slow processing      Extremity/Trunk Assessment                           General Comments  pt pleasant and cooperative    Pertinent Vitals/ Pain       Pain Assessment: 0-10 Pain Score: 5  Pain Location: R LE Pain Descriptors / Indicators: Aching;Sore;Grimacing;Guarding Pain Intervention(s): Limited activity within patient's tolerance;Monitored during  session;Repositioned;Premedicated before session                                                          Frequency Min 2X/week     Progress Toward Goals  OT Goals(current goals can now be found in the care plan section)  Progress towards OT goals: Progressing toward goals     Plan Discharge plan remains appropriate                     End of Session Equipment Utilized During Treatment: Gait belt;Rolling walker;Other (comment) (BSC)   Activity  Tolerance Patient limited by fatigue;Patient limited by pain   Patient Left in bed;with call bell/phone within reach;with bed alarm set   Nurse Communication          Time: [id number] OT Time Calculation (min): 26 min  Charges: OT General Charges $OT Visit: 1 Procedure OT Treatments $Self Care/Home Management : 8-22 mins $Therapeutic Activity: 8-22 mins  Britt Bottom 08/04/2015, 2:25 PM

## 2015-08-04 NOTE — Consult Note (Signed)
[facility] Consult   Reason for Consult:  Erratic behavior and unstable mood with crying spells Referring Physician:  Dr. Cruzita Lederer Patient Identification: Wanda Griffin MRN:  [id number] Principal Diagnosis: Anxiety and depression Diagnosis:   Patient Active Problem List   Diagnosis Date Noted  . CVA (cerebral vascular accident) ([place]) [I63.9] 08/01/2015  . 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 Malnutrition of moderate degree [E44.0] 07/30/2015  . Femur fracture, right, closed, initial encounter [S72.91XA] 07/28/2015  . Femur fracture ([place]) [S72.90XA] 07/27/2015  . Anxiety and depression [F41.8] 07/27/2015  . Tobacco abuse [Z72.0] 07/27/2015  . Macrocytosis without anemia [D75.89] 07/27/2015  . Hyperlipidemia [E78.5] 07/27/2015  . Chronic constipation [K59.00] 07/27/2015  . COPD (chronic obstructive pulmonary disease) ([place]) [J44.9] 07/27/2015  . Femur fracture, right ([place]) [S72.91XA] 07/27/2015  . Altered mental status [R41.82] 05/24/2012  . Seizure disorder ([place]) [P32.951] 05/24/2012  . Syncope and collapse [R55] 05/24/2012  . Polysubstance (excluding opioids) dependence, daily use ([place]) [F19.20] 05/24/2012    Total Time spent with patient: 1 hour  Subjective:   Wanda Griffin is a 60 y.o. female patient admitted with Status post femur fracture,History of seizures and behavioral changes.  HPI:  [name] is a 60 y.o. Female, Seen chart reviewed and case discussed with the Dr. Cruzita Lederer for this face-to-face psychiatric consultation and evaluation of behavioral problems, mood and uncontrollable anxiety. Patient is a poor historian, does not want to use verbal language mostly closed her eyes and making nonverbal sounds for yes or no. Patient stated she does not want to talk any longer and wanted to be isolated and after 25 minutes in her room. Patient is also planned to go for the MRI scan of the brain. Patient endorses being depressed  and anxious and also taking medication for depression anxiety but does not see a psychiatrist. Patient reportedly takes that medication from primary care physician. Patient denied current symptoms of depression, anxiety,auditory/visual hallucinations, delusions and paranoia. Patient reports multiple body pains. Patient has no family members at bedside and could not reach for Collateral information at this time. Review of labs indicated her valproic acid level is less than Therapeutic value which may be able to register higher dose if needed. Review of EKG indicated she has no prolonged QTCso we can give her Seroquel twice daily for now to control unstable emotions.    Past Psychiatric History: Patient denied being admitted to psychiatric facility and also denied history of suicidal ideation, intention or plans or attempts.  Interval history: Patient seen today for psychiatric consultation follow-up. Patient has been awake, alert, oriented to time place person and situation. Patient is calm and cooperative. Patient stated she has a good day and wish this provider to have a good day. Patient has been under cardiac ultrasound scanner and very cooperative. Patient denies current symptoms of depression, anxiety, and psychosis. Patient has no bizarre behaviors or guardedness as seen during last visit. Denied suicidal/homicidal ideation, psychosis and contract for safety Patient has been compliant with her medication and cooperative with the staff members. Patient does not required anymore psychiatric services at this time so we will sign off this case.    Risk to Self: Is patient at risk for suicide?: No Risk to Others:   Prior Inpatient Therapy:   Prior Outpatient Therapy:    Past Medical History:  Past Medical History  Diagnosis Date  . Subdural hematoma ([place]) 2012    After tumor resection  . 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 Mental health disorder   . IBS  (irritable bowel syndrome)     chronic constipation  . COPD (chronic obstructive pulmonary disease) ([facility])     FEV1 36% predicted  . 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 Tobacco abuse   . Benzodiazepine abuse   . Altered mental status     /notes "has episodes ~ q month/family" (05/23/2012)  . Anxiety   . Depression   . Seizures [facility])     Past Surgical History  Procedure Laterality Date  . Brain surgery  01/2012    Tumor resection  . Loop recorder implant    . Subdural hematoma evacuation via craniotomy Left 2009  . Subdural hematoma evacuation via craniotomy Right 2014  . Tubal ligation    . Vaginal hysterectomy    . Dilation and curettage of uterus    . Cardiac catheterization    . Coronary angioplasty with stent placement  ~ 2009    "1" (05/23/2012)  . Femur im nail Right 07/28/2015    Procedure: REMOVAL OF TRACTION PIN INTRAMEDULLART (IM) NAIL FEMORAL;  Surgeon: Rod Can, MD;  Location: [facility];  Service: Orthopedics;  Laterality: Right;   Family History:  Family History  Problem Relation Age of Onset  . Hypertension Mother   . Hyperlipidemia Mother    Family Psychiatric  History: Patient denied family history of mental illness. Social History:  History  Alcohol Use No     History  Drug Use  . Yes  . Special: Marijuana    Comment: 05/23/2012 "last marijuana ~ 3 wk ago; usually smoke once q 2-3 months"    Social History   Social History  . Marital Status: Married    Spouse Name: N/A  . Number of Children: N/A  . Years of Education: N/A   Social History Main Topics  . Smoking status: Current Every Day Smoker -- 0.50 packs/day for 20 years    Types: Cigarettes  . Smokeless tobacco: Never Used     Comment: 05/23/2012 offered smoking cessation materials; pt declines  . Alcohol Use: No  . Drug Use: Yes    Special: Marijuana     Comment: 05/23/2012 "last marijuana ~ 3 wk ago; usually smoke once q 2-3 months"  . Sexual Activity:  Yes   Other Topics Concern  . None   Social History Narrative   Lives with husband in [place], [place]. All care is received from [facility].    Additional Social History:    Allergies:   Allergies  Allergen Reactions  . Ampicillin Hives  . Asa [Aspirin] Hives  . Colchicine     unknown  . Toradol [Ketorolac Tromethamine] Hives    Labs:  Results for orders placed or performed during the hospital encounter of 07/27/15 (from the past 48 hour(s))  Basic metabolic panel     Status: Abnormal   Collection Time: 08/03/15  4:19 AM  Result Value Ref Range   Sodium 135 135 - 145 mmol/L   Potassium 3.8 3.5 - 5.1 mmol/L   Chloride 100 (L) 101 - 111 mmol/L   CO2 24 22 - 32 mmol/L   Glucose, Bld 72 65 - 99 mg/dL   BUN 11 6 - 20 mg/dL   Creatinine, Ser 0.63 0.44 - 1.00 mg/dL   Calcium 8.7 (L) 8.9 - 10.3 mg/dL   GFR calc non Af Amer >60 >60  mL/min   GFR calc Af Amer >60 >60 mL/min    Comment: (NOTE) The eGFR has been calculated using the CKD EPI equation. This calculation has not been validated in all clinical situations. eGFR's persistently <60 mL/min signify possible Chronic Kidney Disease.    Anion gap 11 5 - 15  CBC     Status: Abnormal   Collection Time: 08/03/15  4:19 AM  Result Value Ref Range   WBC 5.1 4.0 - 10.5 K/uL   RBC 2.95 (L) 3.87 - 5.11 MIL/uL   Hemoglobin 9.5 (L) 12.0 - 15.0 g/dL   HCT 30.8 (L) 36.0 - 46.0 %   MCV 104.4 (H) 78.0 - 100.0 fL   MCH 32.2 26.0 - 34.0 pg   MCHC 30.8 30.0 - 36.0 g/dL   RDW 13.4 11.5 - 15.5 %   Platelets 241 150 - 400 K/uL  Hemoglobin A1c     Status: None   Collection Time: 08/03/15  4:19 AM  Result Value Ref Range   Hgb A1c MFr Bld 5.2 4.8 - 5.6 %    Comment: (NOTE)         Pre-diabetes: 5.7 - 6.4         Diabetes: >6.4         Glycemic control for adults with diabetes: <7.0    Mean Plasma Glucose 103 mg/dL    Comment: (NOTE) Performed At: [facility] [place], [place] [id number] Lindon Romp  MD [id number]   Lipid panel     Status: Abnormal   Collection Time: 08/03/15  4:19 AM  Result Value Ref Range   Cholesterol 102 0 - 200 mg/dL   Triglycerides 165 (H) <150 mg/dL   HDL 37 (L) >40 mg/dL   Total CHOL/HDL Ratio 2.8 RATIO   VLDL 33 0 - 40 mg/dL   LDL Cholesterol 32 0 - 99 mg/dL    Comment:        Total Cholesterol/HDL:CHD Risk Coronary Heart Disease Risk Table                     Men   Women  1/2 Average Risk   3.4   3.3  Average Risk       5.0   4.4  2 X Average Risk   9.6   7.1  3 X Average Risk  23.4   11.0        Use the calculated Patient Ratio above and the CHD Risk Table to determine the patient's CHD Risk.        ATP III CLASSIFICATION (LDL):  <100     mg/dL   Optimal  100-129  mg/dL   Near or Above                    Optimal  130-159  mg/dL   Borderline  160-189  mg/dL   High  >190     mg/dL   Very High   Glucose, capillary     Status: None   Collection Time: 08/03/15  3:01 PM  Result Value Ref Range   Glucose-Capillary 74 65 - 99 mg/dL  Creatinine, serum     Status: None   Collection Time: 08/04/15  5:02 AM  Result Value Ref Range   Creatinine, Ser 0.63 0.44 - 1.00 mg/dL   GFR calc non Af Amer >60 >60 mL/min   GFR calc Af Amer >60 >60 mL/min    Comment: (NOTE) The eGFR has been  calculated using the CKD EPI equation. This calculation has not been validated in all clinical situations. eGFR's persistently <60 mL/min signify possible Chronic Kidney Disease.     Current Facility-Administered Medications  Medication Dose Route Frequency Provider Last Rate Last Dose  . acetaminophen (TYLENOL) tablet 650 mg  650 mg Oral Q6H PRN Rod Can, MD   650 mg at 08/03/15 2332   Or  . acetaminophen (TYLENOL) suppository 650 mg  650 mg Rectal Q6H PRN Rod Can, MD      . bisacodyl (DULCOLAX) EC tablet 5 mg  5 mg Oral Daily PRN [name], MD      . clopidogrel (PLAVIX) tablet 75 mg  75 mg Oral Daily David L Rinehuls, PA-C   75 mg at 08/04/15  0901  . divalproex (DEPAKOTE) DR tablet 500 mg  500 mg Oral TID [name], MD   500 mg at 08/04/15 0901  . docusate sodium (COLACE) capsule 100 mg  100 mg Oral BID Rod Can, MD   100 mg at 08/04/15 0901  . enoxaparin (LOVENOX) injection 40 mg  40 mg Subcutaneous Q24H Rod Can, MD   40 mg at 08/04/15 0901  . feeding supplement (ENSURE ENLIVE) (ENSURE ENLIVE) liquid 237 mL  237 mL Oral BID BM Albertine Patricia, MD   237 mL at 08/02/15 0856  . ipratropium-albuterol (DUONEB) 0.5-2.5 (3) MG/3ML nebulizer solution 3 mL  3 mL Nebulization Q4H PRN [name], MD      . lubiprostone (AMITIZA) capsule 24 mcg  24 mcg Oral BID WC [name], MD   24 mcg at 08/03/15 1720  . magnesium citrate solution 1 Bottle  1 Bottle Oral Once PRN Rod Can, MD      . menthol-cetylpyridinium (CEPACOL) lozenge 3 mg  1 lozenge Oral PRN Rod Can, MD   3 mg at 07/28/15 2230   Or  . phenol (CHLORASEPTIC) mouth spray 1 spray  1 spray Mouth/Throat PRN Rod Can, MD      . metoCLOPramide (REGLAN) tablet 5-10 mg  5-10 mg Oral Q8H PRN Rod Can, MD       Or  . metoCLOPramide (REGLAN) injection 5-10 mg  5-10 mg Intravenous Q8H PRN Rod Can, MD      . mometasone-formoterol (DULERA) 200-5 MCG/ACT inhaler 2 puff  2 puff Inhalation BID [name], MD   2 puff at 08/04/15 0806  . naloxone [facility]) injection 0.4 mg  0.4 mg Intravenous PRN Caren Griffins, MD   0.4 mg at 08/03/15 1516  . nicotine (NICODERM CQ - dosed in mg/24 hours) patch 14 mg  14 mg Transdermal Daily PRN [name], MD      . ondansetron (ZOFRAN) tablet 4 mg  4 mg Oral Q6H PRN Rod Can, MD       Or  . ondansetron (ZOFRAN) injection 4 mg  4 mg Intravenous Q6H PRN Rod Can, MD      . oxyCODONE-acetaminophen (PERCOCET/ROXICET) 5-325 MG per tablet 1 tablet  1 tablet Oral Q6H PRN Caren Griffins, MD   1 tablet at 08/03/15 1222  . polyethylene glycol (MIRALAX / GLYCOLAX) packet 17 g  17 g Oral Daily PRN  [name], MD      . QUEtiapine (SEROQUEL) tablet 25 mg  25 mg Oral BID Ambrose Finland, MD   25 mg at 08/03/15 2331  . rosuvastatin (CRESTOR) tablet 20 mg  20 mg Oral Daily [name], MD   20 mg  at 08/04/15 0901  . senna (SENOKOT) tablet 8.6 mg  1 tablet Oral BID Rod Can, MD   8.6 mg at 08/04/15 0901  . sorbitol 70 % solution 30 mL  30 mL Oral Daily PRN Rod Can, MD        Musculoskeletal: Strength & Muscle Tone: decreased Gait & Station: unable to stand Patient leans: N/A  Psychiatric Specialty Exam: Physical Exam as per history and physical  ROS:  Blood pressure 124/47, pulse 69, temperature 97.9 F (36.6 C), temperature source Oral, resp. rate 16, SpO2 98 %.There is no weight on file to calculate BMI.  General Appearance: Casual  Eye Contact:  Good  Speech:  Clear and Coherent  Volume:  Decreased  Mood:  Depressed  Affect:  Appropriate and Congruent  Thought Process:  Goal Directed  Orientation:  Full (Time, Place, and Person)  Thought Content:  Logical  Suicidal Thoughts:  No  Homicidal Thoughts:  No  Memory:  Immediate;   Fair Recent;   Fair  Judgement:  Fair  Insight:  Fair  Psychomotor Activity:  Normal  Concentration:  Concentration: Fair and Attention Span: Fair  Recall:  [employer] of Knowledge:  Fair  Language:  Fair  Akathisia:  Negative  Handed:  Right  AIMS (if indicated):     Assets:  Communication Skills Desire for Improvement Financial Resources/Insurance Housing Intimacy Leisure Time Resilience Social Support Transportation  ADL's:  Impaired  Cognition:  WNL  Sleep:        Treatment Plan Summary:Patient appeared to be possibly responding to her medication treatment and denies current symptoms of depression, anxiety, psychosis and also safety concerns. Patient has been cooperative with cardiac ultrasound.     Bizarre behaviors: We will increase Seroquel 25 mg twice daily and monitor for the QTC  prolongation  More swings:Patient may benefit from higher dose of DepakoteAs her current valproic acid level is less than therapeutic and also questions about partial compliance with medication  Altered mental status:please review the father medical reasons including stroke and Subclinical seizures, benefit from the neurology consult services  Refer to the social service regarding contacting patient husband for collateral information about history of psychiatric illness and treatment history  Appreciate psychiatric consultation and we sign off as of today Please contact [phone] or [phone] if needs further assistance  Disposition: Patient does not meet criteria for psychiatric inpatient admission. Supportive therapy provided about ongoing stressors.  Ambrose Finland, MD 08/04/2015 11:39 AM

## 2015-08-04 NOTE — Care Management Important Message (Signed)
Important Message  Patient Details  Name: KIRSTY DAGG MRN: [id number] Date of Birth: [birth year]   Medicare Important Message Given:  Yes    Ethridge Sollenberger [place] 08/04/2015, 11:27 AM

## 2015-08-04 NOTE — Progress Notes (Signed)
Report called to [facility] to [facility]. Pt to be transported via ambulance. All belongings with pt. Pt alert and informed of transfer for care. No distress noted.

## 2015-08-04 NOTE — Progress Notes (Signed)
Pt ate choc pudding with her meds. She is alert, communicative and asked to use the bedpan.

## 2015-08-04 NOTE — Clinical Social Work Placement (Signed)
   CLINICAL SOCIAL WORK PLACEMENT  NOTE  Date:  08/04/2015  Patient Details  Name: Wanda Griffin MRN: [id number] Date of Birth: 10/08/1955  Clinical Social Work is seeking post-discharge placement for this patient at the [place] level of care (*CSW will initial, date and re-position this form in  chart as items are completed):  Yes   Patient/family provided with [place] Work Department's list of facilities offering this level of care within the geographic area requested by the patient (or if unable, by the patient's family).  Yes   Patient/family informed of their freedom to choose among providers that offer the needed level of care, that participate in Medicare, Medicaid or managed care program needed by the patient, have an available bed and are willing to accept the patient.  Yes   Patient/family informed of 's ownership interest in [facility] and [facility], as well as of the fact that they are under no obligation to receive care at these facilities.  PASRR submitted to EDS on 07/31/15     PASRR number received on 07/31/15     Existing PASRR number confirmed on       FL2 transmitted to all facilities in geographic area requested by pt/family on 07/31/15     FL2 transmitted to all facilities within larger geographic area on       Patient informed that his/her managed care company has contracts with or will negotiate with certain facilities, including the following:        Yes   Patient/family informed of bed offers received.  Patient chooses bed at [facility]     Physician recommends and patient chooses bed at      Patient to be transferred to [facility] on 08/04/15.  Patient to be transferred to facility by [facility]     Patient family notified on 08/04/15 of transfer.  Name of family member notified:  Patient     PHYSICIAN Please sign FL2     Additional Comment:     _______________________________________________ Caroline Sauger, LCSW 08/04/2015, 3:20 PM

## 2015-08-04 NOTE — Discharge Summary (Addendum)
Physician Discharge Summary  Wanda Griffin [id number] DOB: 1956-01-01 DOA: 07/27/2015  PCP: [name], MD  Admit date: 07/27/2015 Discharge date: 08/04/2015  Admitted From: home Disposition:  SNF  Recommendations for Outpatient Follow-up:  1. Follow up with PCP in 1-2 weeks 2. Follow up with orthopedic surgery in 2 weeks 3. Follow up with Dr. Leonie Man in 2 months 4. DVT prophylaxis per ortho for 30 days with Lovenox 5. Avoid benzodiazepines and pain medications administration at the same time   Discharge Condition: stable CODE STATUS: Full Diet recommendation: regular  HPI: Wanda Griffin is a 60 y.o. 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 Patient reports that she had been in her usual state of health with no fever, chills, chest pain, palpitations, or dyspnea when she was squatting and pivoting to get into her car this evening. She heard a "pop" from the right thigh and experienced immediate pain. EMS was activated for transported to the hospital. She reportedly had a shortening and external rotation of the right lower extremity and was placed in traction by EMS. 200 g of fentanyl was administered en route. Upon arrival to the ED, patient is found to be afebrile, saturating well on room air, mildly hypertensive, but with vitals otherwise stable. EKG demonstrates a sinus rhythm with short PR and RSR'in V1 with no significant change relative to prior EKGs. Chest x-ray is negative for acute cardiopulmonary disease and radiographs of the right femur demonstrate no bleak, mild comminuted and displaced fracture of the proximal shaft. CMP is largely unremarkable and CBC is  notable only for MCV of 105.2. INR is 2.98 and type and screen has been performed. Additional fentanyl was administered in the emergency department, patient remained hemodynamically stable, and orthopedic surgery was consulted by the ED physician. Admission to the hospitalist service was requested with plans for operative management of the right femur fracture.  Hospital Course: Discharge Diagnoses:  Principal Problem:   Anxiety and depression Active Problems:   Seizure disorder ([place])   Femur fracture ([facility])   Tobacco abuse   Macrocytosis without anemia   Hyperlipidemia   Chronic constipation   COPD (chronic obstructive pulmonary disease) ([facility])   Femur fracture, right ([facility])   Femur fracture, right, closed, initial encounter   Malnutrition of moderate degree   CVA (cerebral vascular accident) ([place])   Other depression due to general medical condition  CVA - patient with progressive lethargy following surgery, narcotics discontinued initially, ABG with chronic hypercarbia without acidosis, she had a CT head which showed low-attenuation in the left corona radiata involving the frontal and parietal lobes with mild overlying cortical involvement in the frontal and posterior aspects, probably present in 2014 per radiology favoring chronicity. Neurology was consulted. MRI confirmed acute superimposed upon chronic left hemisphere watershed infarcts. Neurology was consulted and followed patient while hospitalized. She underwent full CVA workup including CT angiogram of the head and neck as well as 2-D echo. Neurology recommended Plavix which will be continued on discharge. Proximal femur fracture, right - Secondary to fall, orthopedic consult greatly appreciated, Status post surgical repair by Dr. Lyla Glassing on 6/26. PT consulted, recommending SNF. Lovenox prophylaxis for 30 days per orthopedic surgery. Acute encephalopathy / lethargy -  may be related to #1, however there is a strong psych component as  well. Psychiatry was consulted and have followed patient hospitalized, recommending increasing her Seroquel as evidenced below in her medication list. She improved, appears to be back to baseline, and is more appropriate. Avoid administering benzodiazepines and narcotics at the same time.  Probable UTI - received ciprofloxacin. Discontinued as cultures negative Anxiety and depression - Continue current management with Seroquel and prn Xanax.  Seizure disorder - Likely secondary to remote brain tumor s/p resection, no seizure activity per EEG. Continue current management. COPD - Continue home regimen  Hyperlipidemia - Continue Crestor  Tobacco abuse - Nicotine patch available  Macrocytosis - MCV 105.2 without anemia, B 12 normal, but on the lower side, received supplement for 3 days IM injections   Discharge Instructions     Medication List    TAKE these medications        acetaminophen 500 MG tablet  Commonly known as:  TYLENOL  Take 500 mg by mouth every 6 (six) hours as needed for mild pain.     ALPRAZolam 0.25 MG tablet  Commonly known as:  XANAX  Take 1 tablet (0.25 mg total) by mouth 2 (two) times daily as needed for anxiety.     budesonide-formoterol 160-4.5 MCG/ACT inhaler  Commonly known as:  SYMBICORT  Inhale 2 puffs into the lungs 2 (two) times daily.     clopidogrel 75 MG tablet  Commonly known as:  PLAVIX  Take 1 tablet (75 mg total) by mouth daily.     divalproex 500 MG DR tablet  Commonly known as:  DEPAKOTE  Take 500 mg by mouth 3 (three) times daily.     enoxaparin 40 MG/0.4ML injection  Commonly known as:  LOVENOX  Inject 0.4 mLs (40 mg total) into the skin daily. For 30 days post op     lubiprostone 24 MCG capsule  Commonly known as:  AMITIZA  Take 24 mcg by mouth 2 (two) times daily with a meal.     oxyCODONE-acetaminophen 5-325 MG tablet  Commonly known as:  PERCOCET/ROXICET  Take 1 tablet by mouth every 8 (eight) hours as needed for severe pain.      QUEtiapine 25 MG tablet  Commonly known as:  SEROQUEL  Take 1 tablet (25 mg total) by mouth 2 (two) times daily.     rosuvastatin 20 MG tablet  Commonly known as:  CRESTOR  Take 20 mg by mouth daily.           Follow-up Information    Follow up with Swinteck, Horald Pollen, MD. Schedule an appointment as soon as possible for a visit in 2 weeks.   Specialty:  Orthopedic Surgery   Why:  For wound re-check   Contact information:   [place] [place] [address] [phone]       Follow up with [facility].   Specialty:  [place]   Why:  someone [facility] will contact you to arrange start date and time for therapy   Contact information:   [place] [address] [phone]       Follow up with SETHI,PRAMOD, MD. Schedule an appointment as soon as possible for a visit in 2 months.   Specialties:  Neurology, Radiology   Contact information:   [address] [place] [address] [phone]      Allergies  Allergen Reactions  . Ampicillin Hives  .  Asa [Aspirin] Hives  . Colchicine     unknown  . Toradol [Ketorolac Tromethamine] Hives    Consultations:    Procedures/Studies: Ct Angio Head W Or Wo Contrast  08/02/2015  CLINICAL DATA:  Patient with RIGHT-sided weakness after repair of RIGHT femur fracture. Recent fall 5 days ago. Increasing lethargy. EXAM: CT ANGIOGRAPHY HEAD AND NECK TECHNIQUE: Multidetector CT imaging of the head and neck was performed using the standard protocol during bolus administration of intravenous contrast. Multiplanar CT image reconstructions and MIPs were obtained to evaluate the vascular anatomy. Carotid stenosis measurements (when applicable) are obtained utilizing NASCET criteria, using the distal internal carotid diameter as the denominator. CONTRAST:  50 mL Isovue 370. COMPARISON:  MR brain 08/01/2015 FINDINGS: According to  technologist note, patient was extremely tearful, and unable to tolerate the entire exam. Post infusion imaging of the brain was truncated. CT HEAD Calvarium and skull base: Mastoids and middle ears are grossly clear. BILATERAL craniotomies appear uncomplicated. Previous mandibular fracture ORIF. The patient is edentulous. Paranasal sinuses: Imaged portions are clear. Orbits: Negative. Brain: Chronic LEFT hemisphere watershed infarcts are redemonstrated. Acute areas of cytotoxic edema better seen on MR. LEFT temporal encephalomalacia. This could reflect resection of tumor or prior trauma. No evidence for hemorrhagic transformation. No mass or hydrocephalus. No recurrent extra-axial collections. CTA NECK Aortic arch: Standard branching. Imaged portion shows no evidence of aneurysm or dissection. No significant stenosis of the major arch vessel origins. LEFT subclavian stent is patent. Right carotid system: Non stenotic atheromatous change. No evidence of dissection, stenosis (50% or greater) or occlusion. Left carotid system: Occlusion of the LEFT internal carotid artery at its origin. No significant reconstitution or string sign. Vertebral arteries: RIGHT vertebral dominant but both patent. No narrowing in the neck. No evidence of dissection, stenosis (50% or greater) or occlusion. CTA HEAD Anterior circulation: Reconstitution of the LEFT anterior circulation appears be primarily RIGHT-to-LEFT cross fill via the anterior communicating. LEFT posterior communicating artery is diminutive. The LEFT M1 MCA is small, without focal stenosis. No significant stenosis, proximal occlusion, aneurysm, or vascular malformation. Posterior circulation: Dominant RIGHT vertebral. Both are patent. No posterior circulation stenosis. No significant stenosis, proximal occlusion, aneurysm, or vascular malformation. Venous sinuses: As permitted by contrast timing, patent. Anatomic variants: None of significance. Delayed phase: Patient  asked to be removed from the CT table before this part of the exam was completed. IMPRESSION: Chronic LEFT ICA occlusion. Acute on chronic LEFT hemisphere watershed infarctions. LEFT temporal encephalomalacia related either to previous trauma or stroke. RIGHT vertebral dominant but both patent. Patent LEFT subclavian stent. No extracranial or intracranial stenosis related to the RIGHT anterior circulation or posterior circulation. Electronically Signed   By: Staci Righter M.D.   On: 08/02/2015 18:21   Ct Head Wo Contrast  08/01/2015  CLINICAL DATA:  Encephalopathy. History of subdural hematoma evacuation. History of brain tumor and brain surgery. EXAM: CT HEAD WITHOUT CONTRAST TECHNIQUE: Contiguous axial images were obtained from the base of the skull through the vertex without intravenous contrast. COMPARISON:  May 23, 2012 FINDINGS: Paranasal sinuses and mastoid air cells are well aerated. The patient is status post bilateral craniotomies which are stable. No acute fractures. Extracranial soft tissues are normal. No subdural, epidural, or subarachnoid hemorrhage. Encephalomalacia in the anterior left temporal lobe is stable. There is decreased attenuation in the left frontal and parietal white matter as seen on axial image 25. There is mild overlying cortical involvement. Most of this finding was not seen in 2014.  However, there was probably a small amount of the anterior white matter change in 2014. No acute cortical ischemia. Ventricles and sulci are similar in the interval. The cerebellum, brainstem, and basal cisterns are normal. No midline shift. IMPRESSION: 1. Low-attenuation in the left corona radiata involving the frontal and parietal lobes with mild overlying cortical involvement in the frontal and posterior aspects. 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 While chronicity can be difficult to determine  with certainty on CT imaging, I would favor this is chronic. If there is concern based on symptoms, an MRI could better assess the age of the finding. 2. No other acute abnormalities. Electronically Signed   By: Dorise Bullion III M.D   On: 08/01/2015 00:16   Ct Angio Neck W Or Wo Contrast  08/02/2015  CLINICAL DATA:  Patient with RIGHT-sided weakness after repair of RIGHT femur fracture. Recent fall 5 days ago. Increasing lethargy. EXAM: CT ANGIOGRAPHY HEAD AND NECK TECHNIQUE: Multidetector CT imaging of the head and neck was performed using the standard protocol during bolus administration of intravenous contrast. Multiplanar CT image reconstructions and MIPs were obtained to evaluate the vascular anatomy. Carotid stenosis measurements (when applicable) are obtained utilizing NASCET criteria, using the distal internal carotid diameter as the denominator. CONTRAST:  50 mL Isovue 370. COMPARISON:  MR brain 08/01/2015 FINDINGS: According to technologist note, patient was extremely tearful, and unable to tolerate the entire exam. Post infusion imaging of the brain was truncated. CT HEAD Calvarium and skull base: Mastoids and middle ears are grossly clear. BILATERAL craniotomies appear uncomplicated. Previous mandibular fracture ORIF. The patient is edentulous. Paranasal sinuses: Imaged portions are clear. Orbits: Negative. Brain: Chronic LEFT hemisphere watershed infarcts are redemonstrated. Acute areas of cytotoxic edema better seen on MR. LEFT temporal encephalomalacia. This could reflect resection of tumor or prior trauma. No evidence for hemorrhagic transformation. No mass or hydrocephalus. No recurrent extra-axial collections. CTA NECK Aortic arch: Standard branching. Imaged portion shows no evidence of aneurysm or dissection. No significant stenosis of the major arch vessel origins. LEFT subclavian stent is patent. Right carotid system: Non stenotic atheromatous change. No evidence of dissection, stenosis  (50% or greater) or occlusion. Left carotid system: Occlusion of the LEFT internal carotid artery at its origin. No significant reconstitution or string sign. Vertebral arteries: RIGHT vertebral dominant but both patent. No narrowing in the neck. No evidence of dissection, stenosis (50% or greater) or occlusion. CTA HEAD Anterior circulation: Reconstitution of the LEFT anterior circulation appears be primarily RIGHT-to-LEFT cross fill via the anterior communicating. LEFT posterior communicating artery is diminutive. The LEFT M1 MCA is small, without focal stenosis. No significant stenosis, proximal occlusion, aneurysm, or vascular malformation. Posterior circulation: Dominant RIGHT vertebral. Both are patent. No posterior circulation stenosis. No significant stenosis, proximal occlusion, aneurysm, or vascular malformation. Venous sinuses: As permitted by contrast timing, patent. Anatomic variants: None of significance. Delayed phase: Patient asked to be removed from the CT table before this part of the exam was completed. IMPRESSION: Chronic LEFT ICA occlusion. Acute on chronic LEFT hemisphere watershed infarctions. LEFT temporal encephalomalacia related either to previous trauma or stroke. RIGHT vertebral dominant but both patent. Patent LEFT subclavian stent. No extracranial or intracranial stenosis related to the RIGHT anterior circulation or posterior circulation. Electronically Signed   By: Staci Righter M.D.   On: 08/02/2015 18:21   Mr Brain Wo Contrast  08/01/2015  CLINICAL DATA:  60 year old with remote history of brain tumor status post resection and subsequent seizure disorder. History subdural hematomas. Remote ovarian cancer. Subclavian steal syndrome. Recent femur fracture. Post surgery 07/29/2015 patient developed right-sided weakness. Abnormal CT. Subsequent encounter. EXAM: MRI HEAD WITHOUT CONTRAST TECHNIQUE: Multiplanar, multiecho pulse sequences of the brain and surrounding structures were  obtained without intravenous contrast. COMPARISON:  07/31/2015 and 05/23/2012 head CT. No comparison brain MR. FINDINGS: Patient did not complete entire MR examination. Acute superimposed upon chronic left hemisphere watershed infarcts. 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 Decreased flow within left middle cerebral artery. Post bilateral craniotomies. No acute intracranial hemorrhage identified. Anterior left temporal lobe cystic encephalomalacia. This may reflect result of resection of tumor per history provided or secondary to prior trauma. There are no comparison MR scans to confirm stability of this appearance. If there were any progressive symptoms felt to be unrelated to acute infarcts than this could be further evaluated on follow-up contrast-enhanced MR (may require sedation). Global atrophy greater left hemisphere.  No hydrocephalus. IMPRESSION: Patient did not complete entire MR examination. Images are motion degraded. Acute superimposed upon chronic left hemisphere watershed infarcts. 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 Decreased flow within left middle cerebral artery. Post bilateral craniotomies. No acute intracranial hemorrhage identified. Anterior left temporal lobe cystic encephalomalacia. This may reflect result of resection of tumor per history provided or secondary to prior trauma. Follow-up as noted above. Electronically Signed   By: Genia Del M.D.   On: 08/01/2015 16:28   Pelvis Portable  07/28/2015  CLINICAL DATA:  60 year old female that is post of right hip surgery. EXAM: PORTABLE PELVIS 1-2 VIEWS COMPARISON:  Intraoperative fluoroscopy stages 07/28/2015 FINDINGS: There has been interval placement of an intrauterine rod and transcervical screw in the right femur. The previously seen proximal femoral fracture is not included in this image. No pelvic bone fracture identified. The bones are osteopenic. There is mild osteoarthritic changes of the hip joints. Air noted within the  urinary bladder, likely introduced via catheterization. Soft tissue air noted in the right thigh related to recent surgery. IMPRESSION: Interval placement of right femoral orthopedic hardware. Electronically Signed   By: Anner Crete M.D.   On: 07/28/2015 20:37   Ct Femur Right Wo Contrast  07/27/2015  CLINICAL DATA:  60 year old female with right femur fracture. EXAM: CT OF THE RIGHT FEMUR WITHOUT CONTRAST TECHNIQUE: Multidetector CT imaging was performed according to the standard protocol. Multiplanar CT image reconstructions were also generated. COMPARISON:  Radiograph dated 07/27/2015 FINDINGS: There is a nondisplaced cortical fracture of the greater trochanter and intertrochanteric ridge. No definite femoral neck fracture identified, however evaluation is limited due to osteopenia. There is a posteriorly displaced comminuted oblique fracture of the proximal third and mid right femoral shaft. There is approximately 4 cm overlap of the fracture fragments. The fracture fragments demonstrate sharp changes without evidence of underlying osseous lesion. There is no dislocation. No significant hematoma. IMPRESSION: Nondisplaced cortical fracture of the intertrochanteric ridge and a posteriorly dislocated, comminuted and oblique fracture of the right femoral diaphysis with approximately 4 cm overlap. Electronically Signed   By: Anner Crete M.D.   On: 07/27/2015 20:08   Dg Chest Portable 1 View  07/27/2015  CLINICAL DATA:  No chest complaints. Hx of COPD. Pt is a current smoker. Pt c/o proximal right femur pain after stepping into her car and hearing her leg pop. No hx of prior injuries. Best obtainable images due to pt condition. EXAM: PORTABLE CHEST 1 VIEW COMPARISON:  05/23/2012 FINDINGS: Normal cardiac silhouette. Vascular stent again noted extending from the aortic arch. Cardiac recording device noted. Normal mediastinum and cardiac silhouette. Normal pulmonary vasculature. No evidence of effusion,  infiltrate, or pneumothorax. No acute bony abnormality. IMPRESSION: No acute cardiopulmonary process. Electronically Signed   By: Suzy Bouchard M.D.   On: 07/27/2015 17:23   Dg Tibia/fibula Right Port  07/27/2015  CLINICAL DATA:  60 year old female with tibia fibula skin placement EXAM: PORTABLE RIGHT TIBIA AND FIBULA - 2 VIEW COMPARISON:  CT dated 07/27/2015 FINDINGS: Two views of the knee and proximal tibia and fibula demonstrate a fixation pin extending through the proximal tibial metadiaphysis through the fibular head. A stabilizing metallic rod is noted along the medial aspect of the knee and distal lower extremity. No acute fracture for dislocation of the visualized bones identified. There is no joint effusion. The soft tissues appear unremarkable. IMPRESSION: Stabilizing device along the medial aspect of the leg and a transfixing screw through the proximal tibia and fibula. Normal alignment. Electronically Signed   By: Anner Crete M.D.   On: 07/27/2015 21:01   Dg C-arm 1-60 Min  07/28/2015  CLINICAL DATA:  Right femoral nail. EXAM: DG C-ARM 61-120 MIN; RIGHT FEMUR 2 VIEWS COMPARISON:  Plain film of the right femur dated 07/27/2015. FINDINGS: Seven intraoperative fluoroscopic images are provided showing placement of an intra medullary rod. The intra medullary rod, femoral neck screw and distal fixation screw appear intact and appropriately positioned traversing the proximal femur fracture site. No evidence of surgical complicating feature. Fluoroscopy provided for the exam. IMPRESSION: Intraoperative films showing internal fixation of the proximal femur fracture. Hardware appears intact and appropriately positioned. Alignment significantly improved status post fixation. No evidence of surgical complicating feature. Electronically Signed   By: Franki Cabot M.D.   On: 07/28/2015 18:05   Dg Femur, Min 2 Views Right  07/28/2015  CLINICAL DATA:  Right femoral nail. EXAM: DG C-ARM 61-120 MIN; RIGHT  FEMUR 2 VIEWS COMPARISON:  Plain film of the right femur dated 07/27/2015. FINDINGS: Seven intraoperative fluoroscopic images are provided showing placement of an intra medullary rod. The intra medullary rod, femoral neck screw and distal fixation screw appear intact and appropriately positioned traversing the proximal femur fracture site. No evidence of surgical complicating feature. Fluoroscopy provided for the exam. IMPRESSION: Intraoperative films showing internal fixation of the proximal femur fracture. Hardware appears intact and appropriately positioned. Alignment significantly improved status post fixation. No evidence of surgical complicating feature. Electronically Signed   By: Franki Cabot M.D.   On: 07/28/2015 18:05   Dg Femur Port, Min 2 Views Right  07/28/2015  CLINICAL DATA:  Postop check of right femur. EXAM: RIGHT FEMUR PORTABLE 1 VIEW COMPARISON:  Plain film of the right femur dated 07/27/2015 and intraoperative films from earlier today. FINDINGS: Patient is status post intra medullary rod fixation of the proximal and mid right femur fractures. Hardware appears intact and appropriately positioned, traversing the right femur fracture sites. Osseous alignment is significantly improved. Expected postsurgical changes within the surrounding soft tissues. IMPRESSION: Status post intra medullary rod fixation of the right femur fractures. Hardware appears intact and appropriately positioned. No evidence of surgical complicating feature. Electronically Signed   By: Franki Cabot M.D.   On: 07/28/2015 19:23   Dg Femur Port, Min 2 Views Right  07/27/2015  CLINICAL DATA:  Right femur pain after stepping into her car today and hearing a pop EXAM: RIGHT FEMUR PORTABLE 1 VIEW COMPARISON:  None. FINDINGS: Four  views of the right femur submitted. There is oblique mild comminuted displaced fracture in proximal shaft of the right femur. There is at least 4 cm separation of bony fragments. No hip dislocation.  IMPRESSION: Oblique mild comminuted displaced fracture proximal shaft of the right femur. Electronically Signed   By: Lahoma Crocker M.D.   On: 07/27/2015 17:25   2D echo Impressions: - Technically difficult; normal LV systolic function; grade 1 diastolic dysfunction.   Subjective: - no complaints, eating breakfast, no chest pain, palpitations  Discharge Exam: Filed Vitals:   08/03/15 2106 08/04/15 0440  BP:  124/47  Pulse: 82 69  Temp:  97.9 F (36.6 C)  Resp: 16 16   Filed Vitals:   08/03/15 2047 08/03/15 2106 08/04/15 0440 08/04/15 0808  BP: 132/46  124/47   Pulse: 83 82 69   Temp: 98 F (36.7 C)  97.9 F (36.6 C)   TempSrc: Oral  Oral   Resp: 16 16 16    SpO2: 100% 99% 98% 98%    General: Pt is alert, awake, not in acute distress Cardiovascular: RRR, S1/S2 +, no rubs, no gallops Respiratory: CTA bilaterally, no wheezing, no rhonchi Abdominal: Soft, NT, ND, bowel sounds + Extremities: no edema, no cyanosis    The results of significant diagnostics from this hospitalization (including imaging, microbiology, ancillary and laboratory) are listed below for reference.     Microbiology: Recent Results (from the past 240 hour(s))  MRSA PCR Screening     Status: None   Collection Time: 07/27/15 11:19 PM  Result Value Ref Range Status   MRSA by PCR NEGATIVE NEGATIVE Final    Comment:        The GeneXpert MRSA Assay (FDA approved for NASAL specimens only), is one component of a comprehensive MRSA colonization surveillance program. It is not intended to diagnose MRSA infection nor to guide or monitor treatment for MRSA infections.   Urine culture     Status: None   Collection Time: 07/28/15 10:27 AM  Result Value Ref Range Status   Specimen Description URINE, CATHETERIZED  Final   Special Requests NONE  Final   Culture NO GROWTH  Final   Report Status 07/29/2015 FINAL  Final     Labs: BNP (last 3 results) No results for input(s): BNP in the last 8760  hours. Basic Metabolic Panel:  Recent Labs Lab 07/29/15 0528 07/30/15 0340 07/31/15 0317 08/01/15 0744 08/03/15 0419 08/04/15 0502  NA 138 141 141 141 135  --   K 4.1 3.7 4.4 4.0 3.8  --   CL 101 104 102 105 100*  --   CO2 30 28 30 27 24   --   GLUCOSE 119* 103* 82 78 72  --   BUN <5* <5* 13 14 11   --   CREATININE 0.87 0.63 0.71 0.68 0.63 0.63  CALCIUM 9.1 8.9 9.1 8.9 8.7*  --    Liver Function Tests: No results for input(s): AST, ALT, ALKPHOS, BILITOT, PROT, ALBUMIN in the last 168 hours. No results for input(s): LIPASE, AMYLASE in the last 168 hours.  Recent Labs Lab 07/31/15 1540  AMMONIA 47*   CBC:  Recent Labs Lab 07/29/15 0528 07/30/15 0340 07/31/15 0317 08/01/15 0744 08/03/15 0419  WBC 7.7 7.3 6.6 5.3 5.1  HGB 10.3* 9.2* 10.0* 9.3* 9.5*  HCT 32.2* 29.3* 31.8* 30.3* 30.8*  MCV 105.6* 104.3* 107.4* 107.1* 104.4*  PLT 175 167 195 211 241   Cardiac Enzymes: No results for input(s): CKTOTAL, CKMB, CKMBINDEX, TROPONINI  in the last 168 hours. BNP: Invalid input(s): POCBNP CBG:  Recent Labs Lab 08/03/15 1501  GLUCAP 74   D-Dimer No results for input(s): DDIMER in the last 72 hours. Hgb A1c  Recent Labs  08/03/15 0419  HGBA1C 5.2   Lipid Profile  Recent Labs  08/03/15 0419  CHOL 102  HDL 37*  LDLCALC 32  TRIG 165*  CHOLHDL 2.8   Thyroid function studies No results for input(s): TSH, T4TOTAL, T3FREE, THYROIDAB in the last 72 hours.  Invalid input(s): FREET3 Anemia work up No results for input(s): VITAMINB12, FOLATE, FERRITIN, TIBC, IRON, RETICCTPCT in the last 72 hours. Urinalysis    Component Value Date/Time   COLORURINE YELLOW 07/28/2015 1027   APPEARANCEUR CLEAR 07/28/2015 1027   LABSPEC 1.011 07/28/2015 1027   PHURINE 6.0 07/28/2015 1027   GLUCOSEU NEGATIVE 07/28/2015 1027   HGBUR TRACE* 07/28/2015 1027   BILIRUBINUR NEGATIVE 07/28/2015 1027   KETONESUR NEGATIVE 07/28/2015 1027   PROTEINUR NEGATIVE 07/28/2015 1027    UROBILINOGEN 1.0 05/23/2012 1359   NITRITE NEGATIVE 07/28/2015 1027   LEUKOCYTESUR MODERATE* 07/28/2015 1027   Sepsis Labs Invalid input(s): PROCALCITONIN,  WBC,  LACTICIDVEN Microbiology Recent Results (from the past 240 hour(s))  MRSA PCR Screening     Status: None   Collection Time: 07/27/15 11:19 PM  Result Value Ref Range Status   MRSA by PCR NEGATIVE NEGATIVE Final    Comment:        The GeneXpert MRSA Assay (FDA approved for NASAL specimens only), is one component of a comprehensive MRSA colonization surveillance program. It is not intended to diagnose MRSA infection nor to guide or monitor treatment for MRSA infections.   Urine culture     Status: None   Collection Time: 07/28/15 10:27 AM  Result Value Ref Range Status   Specimen Description URINE, CATHETERIZED  Final   Special Requests NONE  Final   Culture NO GROWTH  Final   Report Status 07/29/2015 FINAL  Final     Time coordinating discharge: Over 30 minutes  SIGNED:  Marzetta Board, MD  [facility] 08/04/2015, 2:37 PM Pager [phone]  If 7PM-7AM, please contact night-coverage [email or web address] Password [email or web address]

## 2015-08-04 NOTE — Progress Notes (Signed)
Physical Therapy Treatment Patient Details Name: Wanda Griffin MRN: [id number] DOB: Aug 13, 1955 Today's Date: 08/04/2015    History of Present Illness pt presents with R Femur fx s/p IM nail.  pt with hx of Brain Tumor Resection followed by SDH, Seizures, Anxiety, Depression, and COPD.      PT Comments    Pt participating with PT for transfers from bed to [facility] and then to chair. Pt able to take several steps with rw and physical assist. Based upon the patient's current mobility, recommending SNF for further rehabilitation, pt in agreement.   Follow Up Recommendations  SNF     Equipment Recommendations   (to be addressed at next venue)    Recommendations for Other Services       Precautions / Restrictions Precautions Precautions: Fall Restrictions Weight Bearing Restrictions: Yes RLE Weight Bearing: Partial weight bearing RLE Partial Weight Bearing Percentage or Pounds: 30    Mobility  Bed Mobility Overal bed mobility: Needs Assistance Bed Mobility: Supine to Sit     Supine to sit: Mod assist;HOB elevated     General bed mobility comments: cues to use rail to assist, using bed pad to assist hips to EOB  Transfers Overall transfer level: Needs assistance Equipment used: Rolling walker (2 wheeled) Transfers: Sit to/from Stand Sit to Stand: +2 physical assistance;Mod assist Stand pivot transfers: +2 physical assistance;Mod assist       General transfer comment: bed to [facility]  Ambulation/Gait Ambulation/Gait assistance: Mod assist Ambulation Distance (Feet): 3 Feet Assistive device: Rolling walker (2 wheeled) Gait Pattern/deviations: Step-to pattern Gait velocity: slow gait sequence   General Gait Details: physical and verbal assist needed to gait sequence and maintaining weightbearing restrictions.    Stairs            Wheelchair Mobility    Modified Rankin (Stroke Patients Only)       Balance Overall balance assessment: Needs  assistance Sitting-balance support: No upper extremity supported Sitting balance-Leahy Scale: Fair     Standing balance support: Bilateral upper extremity supported Standing balance-Leahy Scale: Poor Standing balance comment: rw and physical assist                    Cognition Arousal/Alertness: Awake/alert Behavior During Therapy: WFL for tasks assessed/performed Overall Cognitive Status: No family/caregiver present to determine baseline cognitive functioning         Following Commands: Follows one step commands consistently Safety/Judgement: Decreased awareness of safety   Problem Solving: Slow processing      Exercises      General Comments        Pertinent Vitals/Pain Pain Assessment: Faces Faces Pain Scale: Hurts even more Pain Location: Rt LE Pain Descriptors / Indicators: Grimacing;Guarding Pain Intervention(s): Monitored during session;Limited activity within patient's tolerance    Home Living                      Prior Function            PT Goals (current goals can now be found in the care plan section) Acute Rehab PT Goals Patient Stated Goal: be able to move with less pain PT Goal Formulation: With patient Time For Goal Achievement: 08/13/15 Potential to Achieve Goals: Good Progress towards PT goals: Progressing toward goals    Frequency  Min 5X/week    PT Plan Current plan remains appropriate    Co-evaluation             End of Session  Activity Tolerance: Patient limited by pain Patient left: in chair;with call bell/phone within reach;with chair alarm set     Time: [id number] PT Time Calculation (min) (ACUTE ONLY): 16 min  Charges:  $Therapeutic Activity: 8-22 mins                    G Codes:      Cassell Clement, PT, CSCS Pager [phone] Office [phone]  08/04/2015, 9:04 AM

## 2015-08-04 NOTE — Progress Notes (Signed)
Nutrition Follow-up  DOCUMENTATION CODES:   Non-severe (moderate) malnutrition in context of chronic illness  INTERVENTION:  Recommend continuation of nutritional supplementation post discharge.  Plans for discharge to SNF.  NUTRITION DIAGNOSIS:   Malnutrition related to chronic illness as evidenced by severe depletion of muscle mass, moderate depletion of body fat; ongoing  GOAL:   Patient will meet greater than or equal to 90% of their needs; progressing  MONITOR:   PO intake, Supplement acceptance, Weight trends, Labs, I & O's  REASON FOR ASSESSMENT:   Consult Hip fracture protocol  ASSESSMENT:   60 y.o. 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 plans for operative management of the right femur fracture. 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  PROCEDURE: (6/26): REMOVAL OF TRACTION PIN INTRAMEDULLART (IM) NAIL FEMORAL (Right)  Meal completion has been poor at 0-25%. Per MD/RN note, pt with poor appetite/po. During time of visit, pt reported she did not eat much of her break fast this AM and instead had consumed a little cup of chocolate ice cream. Pt reports constipation which has been making her have feelings of fullness. Pt currently has Ensure ordered and has been consuming most of them. During time of visit pt reports her recent weight had been 87 lbs. Recommend obtaining new weight. Plans for discharge today. Recommend continuation of nutritional supplementation at SNF to aid in caloric and protein needs.   Labs and mediations reviewed.   Diet Order:  Diet regular Room service appropriate?: Yes; Fluid consistency:: Thin  Skin:   (Incision R hip)  Last BM:  6/24-lax given  Height:    Ht Readings from Last 1 Encounters:  05/23/12 5\' 2"  (1.575 m)    Weight:   Wt Readings from Last 1 Encounters:  05/23/12 102 lb 11.2 oz (46.584 kg)    Ideal Body Weight:  50 kg  BMI:  There is no weight on file to calculate BMI.  Estimated Nutritional Needs:   Kcal:  1400-1700  Protein:  60-70 grams  Fluid:  >/= 1.5 L/day  EDUCATION NEEDS:   No education needs identified at this time  Corrin Parker, MS, RD, LDN Pager # [phone] After hours/ weekend pager # [phone]

## 2015-08-08 DIAGNOSIS — R569 Unspecified convulsions: Secondary | ICD-10-CM | POA: Diagnosis not present

## 2015-08-08 DIAGNOSIS — I6990 Unspecified sequelae of unspecified cerebrovascular disease: Secondary | ICD-10-CM | POA: Diagnosis not present

## 2015-08-08 DIAGNOSIS — Z4789 Encounter for other orthopedic aftercare: Secondary | ICD-10-CM | POA: Diagnosis not present

## 2015-08-08 DIAGNOSIS — J449 Chronic obstructive pulmonary disease, unspecified: Secondary | ICD-10-CM | POA: Diagnosis not present

## 2015-08-18 DIAGNOSIS — S72361D Displaced segmental fracture of shaft of right femur, subsequent encounter for closed fracture with routine healing: Secondary | ICD-10-CM | POA: Diagnosis not present

## 2015-09-02 ENCOUNTER — Ambulatory Visit: Payer: Self-pay | Admitting: Neurology

## 2015-09-03 ENCOUNTER — Encounter: Payer: Self-pay | Admitting: Neurology

## 2015-09-16 DIAGNOSIS — Z87898 Personal history of other specified conditions: Secondary | ICD-10-CM | POA: Diagnosis not present

## 2015-09-16 DIAGNOSIS — S72361D Displaced segmental fracture of shaft of right femur, subsequent encounter for closed fracture with routine healing: Secondary | ICD-10-CM | POA: Diagnosis not present

## 2015-09-24 DIAGNOSIS — G40909 Epilepsy, unspecified, not intractable, without status epilepticus: Secondary | ICD-10-CM | POA: Diagnosis not present

## 2015-09-24 DIAGNOSIS — E46 Unspecified protein-calorie malnutrition: Secondary | ICD-10-CM | POA: Diagnosis not present

## 2015-09-24 DIAGNOSIS — F418 Other specified anxiety disorders: Secondary | ICD-10-CM | POA: Diagnosis not present

## 2015-09-24 DIAGNOSIS — Z4789 Encounter for other orthopedic aftercare: Secondary | ICD-10-CM | POA: Diagnosis not present

## 2015-09-24 DIAGNOSIS — J449 Chronic obstructive pulmonary disease, unspecified: Secondary | ICD-10-CM | POA: Diagnosis not present

## 2015-09-24 DIAGNOSIS — I6990 Unspecified sequelae of unspecified cerebrovascular disease: Secondary | ICD-10-CM | POA: Diagnosis not present

## 2015-10-03 DIAGNOSIS — Z4789 Encounter for other orthopedic aftercare: Secondary | ICD-10-CM | POA: Diagnosis not present

## 2015-10-03 DIAGNOSIS — J449 Chronic obstructive pulmonary disease, unspecified: Secondary | ICD-10-CM | POA: Diagnosis not present

## 2015-10-03 DIAGNOSIS — I6990 Unspecified sequelae of unspecified cerebrovascular disease: Secondary | ICD-10-CM | POA: Diagnosis not present

## 2015-10-03 DIAGNOSIS — R569 Unspecified convulsions: Secondary | ICD-10-CM | POA: Diagnosis not present

## 2015-10-13 DIAGNOSIS — F419 Anxiety disorder, unspecified: Secondary | ICD-10-CM | POA: Diagnosis not present

## 2015-10-13 DIAGNOSIS — F39 Unspecified mood [affective] disorder: Secondary | ICD-10-CM | POA: Diagnosis not present

## 2015-10-22 DIAGNOSIS — G40909 Epilepsy, unspecified, not intractable, without status epilepticus: Secondary | ICD-10-CM | POA: Diagnosis not present

## 2015-10-22 DIAGNOSIS — F17200 Nicotine dependence, unspecified, uncomplicated: Secondary | ICD-10-CM | POA: Diagnosis not present

## 2015-10-22 DIAGNOSIS — G4700 Insomnia, unspecified: Secondary | ICD-10-CM | POA: Diagnosis not present

## 2015-10-22 DIAGNOSIS — E785 Hyperlipidemia, unspecified: Secondary | ICD-10-CM | POA: Diagnosis not present

## 2015-10-22 DIAGNOSIS — Z8781 Personal history of (healed) traumatic fracture: Secondary | ICD-10-CM | POA: Diagnosis not present

## 2015-10-22 DIAGNOSIS — Z720 Tobacco use: Secondary | ICD-10-CM | POA: Diagnosis not present

## 2015-10-29 DIAGNOSIS — S72361D Displaced segmental fracture of shaft of right femur, subsequent encounter for closed fracture with routine healing: Secondary | ICD-10-CM | POA: Diagnosis not present

## 2015-12-02 ENCOUNTER — Observation Stay (HOSPITAL_COMMUNITY): Payer: Medicare Other

## 2015-12-02 ENCOUNTER — Inpatient Hospital Stay (HOSPITAL_COMMUNITY)
Admission: EM | Admit: 2015-12-02 | Discharge: 2015-12-06 | DRG: 065 | Disposition: A | Discharge status: Skilled Nursing Facility | Payer: Medicare Other | Attending: Internal Medicine | Admitting: Internal Medicine

## 2015-12-02 ENCOUNTER — Encounter (HOSPITAL_COMMUNITY): Payer: Self-pay | Admitting: Emergency Medicine

## 2015-12-02 ENCOUNTER — Emergency Department (HOSPITAL_COMMUNITY): Payer: Medicare Other

## 2015-12-02 DIAGNOSIS — Z91018 Allergy to other foods: Secondary | ICD-10-CM

## 2015-12-02 DIAGNOSIS — J449 Chronic obstructive pulmonary disease, unspecified: Secondary | ICD-10-CM | POA: Diagnosis present

## 2015-12-02 DIAGNOSIS — S7291XA Unspecified fracture of right femur, initial encounter for closed fracture: Secondary | ICD-10-CM

## 2015-12-02 DIAGNOSIS — I2510 Atherosclerotic heart disease of native coronary artery without angina pectoris: Secondary | ICD-10-CM | POA: Diagnosis present

## 2015-12-02 DIAGNOSIS — I63039 Cerebral infarction due to thrombosis of unspecified carotid artery: Secondary | ICD-10-CM | POA: Diagnosis not present

## 2015-12-02 DIAGNOSIS — R52 Pain, unspecified: Secondary | ICD-10-CM

## 2015-12-02 DIAGNOSIS — I639 Cerebral infarction, unspecified: Secondary | ICD-10-CM | POA: Diagnosis not present

## 2015-12-02 DIAGNOSIS — G8191 Hemiplegia, unspecified affecting right dominant side: Secondary | ICD-10-CM | POA: Diagnosis present

## 2015-12-02 DIAGNOSIS — Z955 Presence of coronary angioplasty implant and graft: Secondary | ICD-10-CM

## 2015-12-02 DIAGNOSIS — Z8673 Personal history of transient ischemic attack (TIA), and cerebral infarction without residual deficits: Secondary | ICD-10-CM

## 2015-12-02 DIAGNOSIS — S3992XA Unspecified injury of lower back, initial encounter: Secondary | ICD-10-CM | POA: Diagnosis not present

## 2015-12-02 DIAGNOSIS — Z7902 Long term (current) use of antithrombotics/antiplatelets: Secondary | ICD-10-CM

## 2015-12-02 DIAGNOSIS — Z880 Allergy status to penicillin: Secondary | ICD-10-CM

## 2015-12-02 DIAGNOSIS — R55 Syncope and collapse: Secondary | ICD-10-CM | POA: Diagnosis present

## 2015-12-02 DIAGNOSIS — Z7951 Long term (current) use of inhaled steroids: Secondary | ICD-10-CM

## 2015-12-02 DIAGNOSIS — R29704 NIHSS score 4: Secondary | ICD-10-CM | POA: Diagnosis present

## 2015-12-02 DIAGNOSIS — E440 Moderate protein-calorie malnutrition: Secondary | ICD-10-CM | POA: Diagnosis not present

## 2015-12-02 DIAGNOSIS — Z87898 Personal history of other specified conditions: Secondary | ICD-10-CM

## 2015-12-02 DIAGNOSIS — M25551 Pain in right hip: Secondary | ICD-10-CM | POA: Diagnosis not present

## 2015-12-02 DIAGNOSIS — J438 Other emphysema: Secondary | ICD-10-CM | POA: Diagnosis not present

## 2015-12-02 DIAGNOSIS — G9389 Other specified disorders of brain: Secondary | ICD-10-CM | POA: Diagnosis present

## 2015-12-02 DIAGNOSIS — F1210 Cannabis abuse, uncomplicated: Secondary | ICD-10-CM

## 2015-12-02 DIAGNOSIS — M79604 Pain in right leg: Secondary | ICD-10-CM

## 2015-12-02 DIAGNOSIS — I10 Essential (primary) hypertension: Secondary | ICD-10-CM | POA: Diagnosis present

## 2015-12-02 DIAGNOSIS — Z886 Allergy status to analgesic agent status: Secondary | ICD-10-CM

## 2015-12-02 DIAGNOSIS — T8131XA Disruption of external operation (surgical) wound, not elsewhere classified, initial encounter: Secondary | ICD-10-CM | POA: Diagnosis not present

## 2015-12-02 DIAGNOSIS — R296 Repeated falls: Secondary | ICD-10-CM | POA: Diagnosis not present

## 2015-12-02 DIAGNOSIS — S0990XA Unspecified injury of head, initial encounter: Secondary | ICD-10-CM | POA: Diagnosis not present

## 2015-12-02 DIAGNOSIS — G40909 Epilepsy, unspecified, not intractable, without status epilepticus: Secondary | ICD-10-CM | POA: Diagnosis present

## 2015-12-02 DIAGNOSIS — R29898 Other symptoms and signs involving the musculoskeletal system: Secondary | ICD-10-CM | POA: Diagnosis not present

## 2015-12-02 DIAGNOSIS — Z888 Allergy status to other drugs, medicaments and biological substances status: Secondary | ICD-10-CM

## 2015-12-02 DIAGNOSIS — F17210 Nicotine dependence, cigarettes, uncomplicated: Secondary | ICD-10-CM | POA: Diagnosis not present

## 2015-12-02 DIAGNOSIS — T8189XA Other complications of procedures, not elsewhere classified, initial encounter: Secondary | ICD-10-CM | POA: Diagnosis not present

## 2015-12-02 DIAGNOSIS — M545 Low back pain: Secondary | ICD-10-CM | POA: Diagnosis not present

## 2015-12-02 DIAGNOSIS — Z881 Allergy status to other antibiotic agents status: Secondary | ICD-10-CM

## 2015-12-02 DIAGNOSIS — Z9889 Other specified postprocedural states: Secondary | ICD-10-CM

## 2015-12-02 DIAGNOSIS — Z86011 Personal history of benign neoplasm of the brain: Secondary | ICD-10-CM

## 2015-12-02 DIAGNOSIS — Z8781 Personal history of (healed) traumatic fracture: Secondary | ICD-10-CM

## 2015-12-02 DIAGNOSIS — Z720 Tobacco use: Secondary | ICD-10-CM | POA: Diagnosis present

## 2015-12-02 DIAGNOSIS — Z681 Body mass index (BMI) 19 or less, adult: Secondary | ICD-10-CM

## 2015-12-02 DIAGNOSIS — M79651 Pain in right thigh: Secondary | ICD-10-CM | POA: Diagnosis not present

## 2015-12-02 DIAGNOSIS — S70921A Unspecified superficial injury of right thigh, initial encounter: Secondary | ICD-10-CM | POA: Diagnosis not present

## 2015-12-02 DIAGNOSIS — F4323 Adjustment disorder with mixed anxiety and depressed mood: Secondary | ICD-10-CM | POA: Diagnosis present

## 2015-12-02 DIAGNOSIS — Z882 Allergy status to sulfonamides status: Secondary | ICD-10-CM

## 2015-12-02 DIAGNOSIS — E785 Hyperlipidemia, unspecified: Secondary | ICD-10-CM | POA: Diagnosis present

## 2015-12-02 DIAGNOSIS — Z8669 Personal history of other diseases of the nervous system and sense organs: Secondary | ICD-10-CM

## 2015-12-02 DIAGNOSIS — I6522 Occlusion and stenosis of left carotid artery: Secondary | ICD-10-CM | POA: Diagnosis present

## 2015-12-02 DIAGNOSIS — Z885 Allergy status to narcotic agent status: Secondary | ICD-10-CM

## 2015-12-02 LAB — CBC WITH DIFFERENTIAL/PLATELET
BASOS ABS: 0 10*3/uL (ref 0.0–0.1)
BASOS PCT: 1 %
Eosinophils Absolute: 0.1 10*3/uL (ref 0.0–0.7)
Eosinophils Relative: 2 %
HEMATOCRIT: 44.3 % (ref 36.0–46.0)
HEMOGLOBIN: 14.6 g/dL (ref 12.0–15.0)
LYMPHS PCT: 37 %
Lymphs Abs: 2.6 10*3/uL (ref 0.7–4.0)
MCH: 29.6 pg (ref 26.0–34.0)
MCHC: 33 g/dL (ref 30.0–36.0)
MCV: 89.9 fL (ref 78.0–100.0)
MONOS PCT: 11 %
Monocytes Absolute: 0.8 10*3/uL (ref 0.1–1.0)
NEUTROS ABS: 3.5 10*3/uL (ref 1.7–7.7)
NEUTROS PCT: 49 %
Platelets: 205 10*3/uL (ref 150–400)
RBC: 4.93 MIL/uL (ref 3.87–5.11)
RDW: 18.1 % — ABNORMAL HIGH (ref 11.5–15.5)
WBC: 7 10*3/uL (ref 4.0–10.5)

## 2015-12-02 LAB — BASIC METABOLIC PANEL
ANION GAP: 12 (ref 5–15)
BUN: 7 mg/dL (ref 6–20)
CHLORIDE: 103 mmol/L (ref 101–111)
CO2: 25 mmol/L (ref 22–32)
Calcium: 9.7 mg/dL (ref 8.9–10.3)
Creatinine, Ser: 0.66 mg/dL (ref 0.44–1.00)
GFR calc non Af Amer: >60 mL/min (ref >60)
GLUCOSE: 85 mg/dL (ref 65–99)
Potassium: 3.5 mmol/L (ref 3.5–5.1)
Sodium: 140 mmol/L (ref 135–145)

## 2015-12-02 LAB — I-STAT TROPONIN, ED: Troponin i, poc: 0.01 ng/mL (ref 0.00–0.08)

## 2015-12-02 MED ORDER — DIVALPROEX SODIUM 500 MG PO DR TAB
500.0000 mg | DELAYED_RELEASE_TABLET | Freq: Three times a day (TID) | ORAL | Status: DC
  Administered 2015-12-02 – 2015-12-06 (×11): 500 mg via ORAL
  Filled 2015-12-02 (×11): qty 1

## 2015-12-02 MED ORDER — ACETAMINOPHEN 650 MG RE SUPP: 650.0000 mg | RECTAL | Status: DC | PRN

## 2015-12-02 MED ORDER — OXYCODONE-ACETAMINOPHEN 5-325 MG PO TABS
1.0000 | ORAL_TABLET | Freq: Three times a day (TID) | ORAL | Status: DC | PRN
  Administered 2015-12-03 – 2015-12-06 (×4): 1 via ORAL
  Filled 2015-12-02 (×4): qty 1

## 2015-12-02 MED ORDER — STROKE: EARLY STAGES OF RECOVERY BOOK
Freq: Once | Status: AC
  Administered 2015-12-02: 21:00:00
  Filled 2015-12-02: qty 1

## 2015-12-02 MED ORDER — QUETIAPINE FUMARATE 25 MG PO TABS
25.0000 mg | ORAL_TABLET | Freq: Every day | ORAL | Status: DC
  Administered 2015-12-02 – 2015-12-05 (×4): 25 mg via ORAL
  Filled 2015-12-02 (×4): qty 1

## 2015-12-02 MED ORDER — ENOXAPARIN SODIUM 30 MG/0.3ML ~~LOC~~ SOLN
30.0000 mg | Freq: Every day | SUBCUTANEOUS | Status: DC
  Administered 2015-12-02 – 2015-12-05 (×4): 30 mg via SUBCUTANEOUS
  Filled 2015-12-02 (×4): qty 0.3

## 2015-12-02 MED ORDER — ALPRAZOLAM 0.25 MG PO TABS: 0.2500 mg | ORAL_TABLET | Freq: Two times a day (BID) | ORAL | Status: DC | PRN

## 2015-12-02 MED ORDER — MOMETASONE FURO-FORMOTEROL FUM 200-5 MCG/ACT IN AERO
2.0000 | INHALATION_SPRAY | Freq: Two times a day (BID) | RESPIRATORY_TRACT | Status: DC
  Administered 2015-12-03 – 2015-12-06 (×7): 2 via RESPIRATORY_TRACT
  Filled 2015-12-02: qty 8.8

## 2015-12-02 MED ORDER — ACETAMINOPHEN 325 MG PO TABS: 650.0000 mg | ORAL_TABLET | ORAL | Status: DC | PRN

## 2015-12-02 MED ORDER — OXYCODONE-ACETAMINOPHEN 5-325 MG PO TABS
1.0000 | ORAL_TABLET | Freq: Once | ORAL | Status: AC
  Administered 2015-12-02: 1 via ORAL
  Filled 2015-12-02 (×2): qty 1

## 2015-12-02 MED ORDER — CLOPIDOGREL BISULFATE 75 MG PO TABS
75.0000 mg | ORAL_TABLET | Freq: Every day | ORAL | Status: DC
  Administered 2015-12-03 – 2015-12-06 (×4): 75 mg via ORAL
  Filled 2015-12-02 (×4): qty 1

## 2015-12-02 MED ORDER — SODIUM CHLORIDE 0.9 % IV SOLN
INTRAVENOUS | Status: DC
  Administered 2015-12-02: 22:00:00 via INTRAVENOUS

## 2015-12-02 NOTE — ED Triage Notes (Signed)
Pt brought in EMS from home for right leg pain.  Per EMS pt had a fall in June resulting in a right femur fx.  Pt has been completing PT since fall.  Per family pt has decreased mobility x3 weeks with increased pain.  Pt has hx of dementia

## 2015-12-02 NOTE — ED Notes (Signed)
Patient taken back to stretcher via wheelchair; did urinate but was unable to get an urine sample for testing at this time

## 2015-12-02 NOTE — ED Notes (Signed)
Patient transported to CT 

## 2015-12-02 NOTE — ED Notes (Signed)
Pt transported to CT ?

## 2015-12-02 NOTE — H&P (Signed)
History and Physical        Hospital Admission Note Date: 12/02/2015  Patient name: Wanda Griffin Medical record number: [id number] Date of birth: October 11, 1955 Age: 60 y.o. Gender: female  PCP: [name], MD   Referring physician: Jeannett Senior, PA-C  Patient coming from: home    Chief Complaint:  Passed out and right leg weakness  HPI: Patient is a 60 year old female with history of anxiety, seizures, tobacco abuse, poor historian, presented to ED with falls, syncopal episode and right leg weakness. History was obtained from the patient and her husband in the room. The patient reported that she was going to the bathroom today when she had a syncopal episode in the evening. She denied any palpitations, chest pain or shortness of breath, dizziness or lightheadedness prior to the event. Per patient's family, they had noticed that she was progressively getting weaker in the right leg in the last 1 month and in the last 1 week, her right leg "just quit". Patient however did not seek any medical attention. Patient has been falling recently, last fall was 2 days ago. She denied any back pain, nausea or vomiting any diarrhea. No fevers or chills. Patient also reported that her right arm also has been getting weaker. Patient has been eating poorly, has lost about 25 pounds, could not tell the duration of the time period. Per patient she also had a femur fracture 4 months ago and was in a rehabilitation after the surgical repair. She was able to ambulate with a walker a month ago.  ED work-up/course:  CT head showed subacute to chronic infarct in the distal left PCA territory along the parasagittal left frontal lobe CBC, BMET unremarkable   Review of Systems: Positives marked in 'bold' Constitutional: Denies fever, chills, diaphoresis, + poor appetite and fatigue.    HEENT: Denies photophobia, eye pain, redness, hearing loss, ear pain, congestion, sore throat, rhinorrhea, sneezing, mouth sores, trouble swallowing, neck pain, neck stiffness and tinnitus.  + patient reports allergies and sinuses Respiratory: Denies SOB, DOE, cough, chest tightness,  and wheezing.   Cardiovascular: Denies chest pain, palpitations and leg swelling.  Gastrointestinal: Denies nausea, vomiting, abdominal pain, diarrhea, constipation, blood in stool and abdominal distention.  Genitourinary: Denies dysuria, urgency, frequency, hematuria, flank pain and difficulty urinating.  Musculoskeletal: Denies myalgias, back pain, joint swelling, arthralgias  patient is having gait problem due to right leg weakness Skin: Denies pallor, rash and wound.  Neurological: Please see history of present illness Hematological: Denies adenopathy. Easy bruising, personal or family bleeding history  Psychiatric/Behavioral: Denies suicidal ideation, mood changes, confusion, nervousness, sleep disturbance and agitation  Past Medical History: Past Medical History:  Diagnosis Date  . Altered mental status    /notes "has episodes ~ q month/family" (05/23/2012)  . Anxiety   . Benzodiazepine abuse   . Brain tumor ([place]) 2012   Resected, had subsequent subdural hematoma  . COPD (chronic obstructive pulmonary disease) (HCC)    FEV1 36% predicted  . Depression   . IBS (irritable bowel syndrome)    chronic constipation  . Mental health disorder   . Seizures ([place])   . Subclavian steal syndrome  left carotix axillary bypass graft  . Subdural hematoma ([place]) 2012   After tumor resection  . Tobacco abuse     Past Surgical History:  Procedure Laterality Date  . BRAIN SURGERY  01/2012   Tumor resection  . CARDIAC CATHETERIZATION    . CORONARY ANGIOPLASTY WITH STENT PLACEMENT  ~ 2009   "1" (05/23/2012)  . DILATION AND CURETTAGE OF UTERUS    . FEMUR IM NAIL Right 07/28/2015   Procedure: REMOVAL OF  TRACTION PIN INTRAMEDULLART (IM) NAIL FEMORAL;  Surgeon: Rod Can, MD;  Location: [facility];  Service: Orthopedics;  Laterality: Right;  . LOOP RECORDER IMPLANT    . SUBDURAL HEMATOMA EVACUATION VIA CRANIOTOMY Left 2009  . SUBDURAL HEMATOMA EVACUATION VIA CRANIOTOMY Right 2014  . TUBAL LIGATION    . VAGINAL HYSTERECTOMY      Medications: Prior to Admission medications   Medication Sig Start Date End Date Taking? Authorizing Provider  acetaminophen (TYLENOL) 500 MG tablet Take 2,000 mg by mouth daily as needed for headache (or pain).    Yes Historical Provider, MD  ALPRAZolam (XANAX) 0.25 MG tablet Take 1 tablet (0.25 mg total) by mouth 2 (two) times daily as needed for anxiety. Patient taking differently: Take 0.25 mg by mouth every 12 (twelve) hours as needed for anxiety.  08/04/15  Yes Costin Karlyne Greenspan, MD  budesonide-formoterol (SYMBICORT) 160-4.5 MCG/ACT inhaler Inhale 2 puffs into the lungs 2 (two) times daily as needed.    Yes Historical Provider, MD  clopidogrel (PLAVIX) 75 MG tablet Take 1 tablet (75 mg total) by mouth daily. 08/04/15  Yes Costin Karlyne Greenspan, MD  divalproex (DEPAKOTE) 500 MG DR tablet Take 500 mg by mouth 3 (three) times daily.   Yes Historical Provider, MD  oxyCODONE-acetaminophen (PERCOCET/ROXICET) 5-325 MG tablet Take 1 tablet by mouth every 8 (eight) hours as needed for severe pain. 08/04/15  Yes Costin Karlyne Greenspan, MD  QUEtiapine (SEROQUEL) 25 MG tablet Take 1 tablet (25 mg total) by mouth 2 (two) times daily. Patient taking differently: Take 25 mg by mouth at bedtime.  08/04/15  Yes Costin Karlyne Greenspan, MD  zolpidem (AMBIEN) 10 MG tablet Take 5-10 mg by mouth at bedtime as needed for sleep.   Yes Historical Provider, MD  enoxaparin (LOVENOX) 40 MG/0.4ML injection Inject 0.4 mLs (40 mg total) into the skin daily. For 30 days post op Patient not taking: Reported on 12/02/2015 08/04/15   Caren Griffins, MD    Allergies:   Allergies  Allergen Reactions  . Ampicillin Hives   . Asa [Aspirin] Hives    Other reaction(s): Angioedema (ALLERGY/intolerance)  . Coconut Oil Swelling  . Codeine Itching    Morphine ok  . Colchicine Other (See Comments)    Reaction unknown by patient  . Penicillins Hives    Has patient had a PCN reaction causing immediate rash, facial/tongue/throat swelling, SOB or lightheadedness with hypotension: Yes Has patient had a PCN reaction causing severe rash involving mucus membranes or skin necrosis: No Has patient had a PCN reaction that required hospitalization No Has patient had a PCN reaction occurring within the last 10 years: No If all of the above answers are "NO", then may proceed with Cephalosporin use.   . Toradol [Ketorolac Tromethamine] Hives  . Sulfamethoxazole Rash    Social History:  reports that she has been smoking Cigarettes.  She has a 10.00 pack-year smoking history. She has never used smokeless tobacco. She reports that she uses drugs, including Marijuana. She reports that  she does not drink alcohol.  Family History: Family History  Problem Relation Age of Onset  . Hypertension Mother   . Hyperlipidemia Mother     Physical Exam: Blood pressure 157/74, pulse 82, temperature 98.4 F (36.9 C), temperature source Oral, resp. rate 24, height 5\' 2"  (1.575 m), weight 43.1 kg (95 lb), SpO2 94 %. General: Alert, awake, oriented x3, Frail and malnourished HEENT: normocephalic, atraumatic, anicteric sclera, pink conjunctiva, pupils equal and reactive to light and accomodation, oropharynx clear Neck: supple, no masses or lymphadenopathy, no goiter, no bruits  Heart: Regular rate and rhythm, without murmurs, rubs or gallops. Lungs: Clear to auscultation bilaterally, no wheezing, rales or rhonchi. Abdomen: Soft, nontender, nondistended, positive bowel sounds, no masses. Extremities: No clubbing, cyanosis or edema with positive pedal pulses. Neuro: left upper extremity and lower extremity 4-5/5, right upper extremity 4/5,  right leg lower extremity 0/5 [email or web address] TTP over the right hip and right anterior femur. Unable to lift her leg.   Psych: alert and oriented x 3, normal mood and affect Skin: no rashes or lesions, warm and dry   LABS on Admission:  Basic Metabolic Panel:  Recent Labs Lab 12/02/15 1638  NA 140  K 3.5  CL 103  CO2 25  GLUCOSE 85  BUN 7  CREATININE 0.66  CALCIUM 9.7   Liver Function Tests: No results for input(s): AST, ALT, ALKPHOS, BILITOT, PROT, ALBUMIN in the last 168 hours. No results for input(s): LIPASE, AMYLASE in the last 168 hours. No results for input(s): AMMONIA in the last 168 hours. CBC:  Recent Labs Lab 12/02/15 1638  WBC 7.0  NEUTROABS 3.5  HGB 14.6  HCT 44.3  MCV 89.9  PLT 205   Cardiac Enzymes: No results for input(s): CKTOTAL, CKMB, CKMBINDEX, TROPONINI in the last 168 hours. BNP: Invalid input(s): POCBNP CBG: No results for input(s): GLUCAP in the last 168 hours.  Radiological Exams on Admission:  No results found.  *I have personally reviewed the images above*  EKG showed rate 80, normal sinus rhythm   Assessment/Plan Principal Problem:   Right leg weakness with subacute CVA (cerebral vascular accident) ([place])  - CT scan of the head shows subacute to chronic infarct in the distal left ACA territory - Patient reports complete right leg weakness and the last one week however did not seek medical attention - Currently on Plavix, will continue - Requested neurology consult, MRI of the brain, 2-D echo, carotid Dopplers - Swallow evaluation, PT OT evaluation, will likely need rehabilitation - Obtain lipid panel, hemoglobin A1c, TSH - will also check x-rays of the right hip and femur, Doppler ultrasound of the RLE  Active Problems:   Syncope and collapse - Patient reported that she passed out in the bathroom, place on telemetry - Obtain 2-D echocardiogram for further workup    Tobacco abuse -Counseled strongly on smoking cessation,  placed on nicotine patch    COPD (chronic obstructive pulmonary disease) (HCC) - Currently stable continue dulera     Malnutrition of moderate degree - Patient reports poor appetite and losing weight, appears to be deconditioned  - Nutrition consult   anxiety/depression Continue Seroquel, Xanax  History of seizures Continue Depakote   DVT prophylaxis:  Lovenox   CODE STATUS:  Full CODE STATUS  Consults called:  neurology   Family Communication: Admission, patients condition and plan of care including tests being ordered have been discussed with the patient, husband and mother-in-law  who indicates understanding and agree with the plan and  Code Status  Admission status:  observation, telemetry   Disposition plan: Further plan will depend as patient's clinical course evolves and further radiologic and laboratory data become available.   At the time of admission, it appears that the appropriate admission status for this patient is observation. This is judged to be reasonable and necessary in order to provide the required intensity of service to ensure the patient's safety given the presenting symptoms, physical exam findings, and initial radiographic and laboratory data in the context of their chronic comorbidities.     Time Spent on Admission: 60 minutes   Cristan Scherzer M.D. [facility] 12/02/2015, 5:55 PM Pager: [phone]  If 7PM-7AM, please contact night-coverage [email or web address] Password [email or web address]

## 2015-12-02 NOTE — ED Notes (Signed)
Patient physically moved from hallway in to room B14; PA student at bedside speaking to patient

## 2015-12-02 NOTE — ED Notes (Signed)
Updated family to room number and status.

## 2015-12-02 NOTE — ED Provider Notes (Signed)
[facility] DEPT Provider Note   CSN: [id number] Arrival date & time: 12/02/15  1209     History   Chief Complaint Chief Complaint  Patient presents with  . Leg Pain    HPI Wanda Griffin is a 60 y.o. female.  HPI Wanda Griffin is a 60 y.o. female with history of brain tumor with resection, anxiety, seizures, depression, COPD, presents to emergency department complaining of right leg pain and falls. Patient had femur fracture 4 months ago. She spent some time at a rehabilitation facility after the surgical repair of the fracture. Patient was able to ambulate with a walker. Family state over the last 2 weeks, patient has had progressively worsening pain in the right leg and states "she is just dragging right leg when walking." In the last few days, patient hasn't been able to ambulate at all. Patient has had several falls, last fall was 2 days ago. Husband states that he found patient between the bathtub in a toilet. There was no loss of consciousness. Patient denies any headache or hitting her head. She states her leg is painful to move. She states she is also unable to move it in the last few days. She also reports decreased sensation to the leg and the foot. Denies any back pain. Denies abdominal pain. No fever or chills.  Pt also later mentions that her right arm has been weak for about 2 months. Denies pain in the arm.   Past Medical History:  Diagnosis Date  . Altered mental status    /notes "has episodes ~ q month/family" (05/23/2012)  . Anxiety   . Benzodiazepine abuse   . Brain tumor ([place]) 2012   Resected, had subsequent subdural hematoma  . COPD (chronic obstructive pulmonary disease) (HCC)    FEV1 36% predicted  . Depression   . IBS (irritable bowel syndrome)    chronic constipation  . Mental health disorder   . Seizures ([place])   . 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 Subdural hematoma ([place]) 2012   After tumor resection  .  Tobacco abuse     Patient Active Problem List   Diagnosis Date Noted  . CVA (cerebral vascular accident) ([place]) 08/01/2015  . Other depression due to general medical condition   . Malnutrition of moderate degree 07/30/2015  . Femur fracture, right, closed, initial encounter 07/28/2015  . Femur fracture ([place]) 07/27/2015  . Anxiety and depression 07/27/2015  . Tobacco abuse 07/27/2015  . Macrocytosis without anemia 07/27/2015  . Hyperlipidemia 07/27/2015  . Chronic constipation 07/27/2015  . COPD (chronic obstructive pulmonary disease) ([place]) 07/27/2015  . Femur fracture, right ([place]) 07/27/2015  . Altered mental status 05/24/2012  . Seizure disorder ([place]) 05/24/2012  . Syncope and collapse 05/24/2012  . Polysubstance (excluding opioids) dependence, daily use ([place]) 05/24/2012    Past Surgical History:  Procedure Laterality Date  . BRAIN SURGERY  01/2012   Tumor resection  . CARDIAC CATHETERIZATION    . CORONARY ANGIOPLASTY WITH STENT PLACEMENT  ~ 2009   "1" (05/23/2012)  . DILATION AND CURETTAGE OF UTERUS    . FEMUR IM NAIL Right 07/28/2015   Procedure: REMOVAL OF TRACTION PIN INTRAMEDULLART (IM) NAIL FEMORAL;  Surgeon: Rod Can, MD;  Location: [facility];  Service: Orthopedics;  Laterality: Right;  . LOOP RECORDER IMPLANT    . SUBDURAL HEMATOMA EVACUATION VIA CRANIOTOMY Left 2009  . SUBDURAL HEMATOMA EVACUATION VIA CRANIOTOMY Right 2014  . TUBAL LIGATION    .  VAGINAL HYSTERECTOMY      OB History    No data available       Home Medications    Prior to Admission medications   Medication Sig Start Date End Date Taking? Authorizing Provider  acetaminophen (TYLENOL) 500 MG tablet Take 2,000 mg by mouth daily as needed for headache (or pain).    Yes Historical Provider, MD  ALPRAZolam (XANAX) 0.25 MG tablet Take 1 tablet (0.25 mg total) by mouth 2 (two) times daily as needed for anxiety. Patient taking differently: Take 0.25 mg by mouth every 12 (twelve) hours as needed for  anxiety.  08/04/15  Yes Costin Karlyne Greenspan, MD  budesonide-formoterol (SYMBICORT) 160-4.5 MCG/ACT inhaler Inhale 2 puffs into the lungs 2 (two) times daily as needed.    Yes Historical Provider, MD  clopidogrel (PLAVIX) 75 MG tablet Take 1 tablet (75 mg total) by mouth daily. 08/04/15  Yes Costin Karlyne Greenspan, MD  divalproex (DEPAKOTE) 500 MG DR tablet Take 500 mg by mouth 3 (three) times daily.   Yes Historical Provider, MD  oxyCODONE-acetaminophen (PERCOCET/ROXICET) 5-325 MG tablet Take 1 tablet by mouth every 8 (eight) hours as needed for severe pain. 08/04/15  Yes Costin Karlyne Greenspan, MD  QUEtiapine (SEROQUEL) 25 MG tablet Take 1 tablet (25 mg total) by mouth 2 (two) times daily. Patient taking differently: Take 25 mg by mouth at bedtime.  08/04/15  Yes Costin Karlyne Greenspan, MD  zolpidem (AMBIEN) 10 MG tablet Take 5-10 mg by mouth at bedtime as needed for sleep.   Yes Historical Provider, MD  enoxaparin (LOVENOX) 40 MG/0.4ML injection Inject 0.4 mLs (40 mg total) into the skin daily. For 30 days post op Patient not taking: Reported on 12/02/2015 08/04/15   Caren Griffins, MD    Family History Family History  Problem Relation Age of Onset  . Hypertension Mother   . Hyperlipidemia Mother     Social History Social History  Substance Use Topics  . Smoking status: Current Every Day Smoker    Packs/day: 0.50    Years: 20.00    Types: Cigarettes  . Smokeless tobacco: Never Used     Comment: 05/23/2012 offered smoking cessation materials; pt declines  . Alcohol use No     Allergies   Ampicillin; Asa [aspirin]; Coconut oil; Codeine; Colchicine; Penicillins; Toradol [ketorolac tromethamine]; and Sulfamethoxazole   Review of Systems Review of Systems  Constitutional: Negative for chills and fever.  Respiratory: Negative for cough, chest tightness and shortness of breath.   Cardiovascular: Negative for chest pain, palpitations and leg swelling.  Gastrointestinal: Negative for abdominal pain, diarrhea,  nausea and vomiting.  Genitourinary: Negative for dysuria, flank pain and pelvic pain.  Musculoskeletal: Negative for arthralgias, myalgias, neck pain and neck stiffness.  Skin: Negative for rash.  Neurological: Positive for weakness and numbness. Negative for dizziness and headaches.  All other systems reviewed and are negative.    Physical Exam Updated Vital Signs BP 101/88 (BP Location: Left Arm)   Pulse 87   Temp 98.4 F (36.9 C) (Oral)   Resp 18   Ht 5\' 2"  (1.575 m)   Wt 43.1 kg   SpO2 97%   BMI 17.38 kg/m   Physical Exam  Constitutional: She is oriented to person, place, and time. She appears well-developed and well-nourished. No distress.  HENT:  Head: Normocephalic.  Eyes: Conjunctivae are normal.  Neck: Neck supple.  Cardiovascular: Normal rate, regular rhythm and normal heart sounds.   Pulmonary/Chest: Effort normal and breath sounds  normal. No respiratory distress. She has no wheezes. She has no rales.  Abdominal: Soft. Bowel sounds are normal. She exhibits no distension. There is no tenderness. There is no rebound.  Musculoskeletal: She exhibits no edema.  ttp over right hip, right anterior femur, right anterior knee. Unable to actively perfom ROM of hip, knee, ankle. Passive rom pain  Neurological: She is alert and oriented to person, place, and time. She displays normal reflexes. No cranial nerve deficit. Coordination normal.  Right arm grip strength is 55 and equal bilaterally. Patient has weakness with biceps, triceps, deltoid. Unable to hold her arm in front of her. Flaccid paralysis except for the sharp touch over the sole of the foot and medial leg to the right leg. Patient is able to slightly lift the leg above the stricture, however unable to move her foot  Skin: Skin is warm and dry.  Psychiatric: She has a normal mood and affect. Her behavior is normal.  Nursing note and vitals reviewed.    ED Treatments / Results  Labs (all labs ordered are listed,  but only abnormal results are displayed) Labs Reviewed  CBC WITH DIFFERENTIAL/PLATELET - Abnormal; Notable for the following:       Result Value   RDW 18.1 (*)    All other components within normal limits  BASIC METABOLIC PANEL  I-STAT TROPOININ, ED    EKG  EKG Interpretation None       Radiology Dg Lumbar Spine Complete  Result Date: 12/02/2015 CLINICAL DATA:  Pain following recent fall EXAM: LUMBAR SPINE - COMPLETE 4+ VIEW COMPARISON:  None. FINDINGS: Frontal, lateral, spot lumbosacral lateral, and bilateral oblique views were obtained. There are 5 non-rib-bearing lumbar type vertebral bodies. There is no fracture or spondylolisthesis. Disc spaces appear normal. There is no appreciable facet arthropathy. There is extensive aortoiliac atherosclerotic calcification. IMPRESSION: No fracture or spondylolisthesis. No appreciable disc space narrowing or facet arthropathy. There is extensive aortoiliac atherosclerosis. Electronically Signed   By: Lowella Grip III M.D.   On: 12/02/2015 15:21   Dg Hip Unilat W Or Wo Pelvis 2-3 Views Right  Result Date: 12/02/2015 CLINICAL DATA:  Pain with recent falls EXAM: DG HIP (WITH OR WITHOUT PELVIS) 2-3V RIGHT COMPARISON:  July 28, 2015 FINDINGS: Frontal pelvis as well as frontal and lateral right hip images were obtained. There is evidence of previous surgical fixation for fracture of the mid right femur. There is a screw with its tip in the proximal right femoral head. There is evidence of an old healed fracture of the left ischium. No acute fracture or dislocation. Joint spaces appear normal. No erosive change. There is extensive iliac artery atherosclerosis. IMPRESSION: Evidence of old trauma. No acute fracture or dislocation. There is iliac artery atherosclerosis bilaterally. Joint spaces appear intact. Electronically Signed   By: Lowella Grip III M.D.   On: 12/02/2015 15:37   Dg Femur Min 2 Views Right  Result Date: 12/02/2015 CLINICAL  DATA:  Pain following recent fall EXAM: RIGHT FEMUR 2 VIEWS COMPARISON:  July 28, 2015 FINDINGS: Frontal and lateral views were obtained. There is screw and rod fixation through a prior fracture of the mid right femur with bony remodeling at the fracture site. No acute fracture or dislocation. There is no knee joint effusion. There is mild osteoarthritic change in the right knee joint. IMPRESSION: Postoperative change with remodeling. No acute fracture or dislocation. Mild osteoarthritic change right knee joint. No knee joint effusion. Electronically Signed   By: Lowella Grip  III M.D.   On: 12/02/2015 15:22    Procedures Procedures (including critical care time)  Medications Ordered in ED Medications  oxyCODONE-acetaminophen (PERCOCET/ROXICET) 5-325 MG per tablet 1 tablet (1 tablet Oral Given 12/02/15 1546)     Initial Impression / Assessment and Plan / ED Course  I have reviewed the triage vital signs and the nursing notes.  Pertinent labs & imaging results that were available during my care of the patient were reviewed by me and considered in my medical decision making (see chart for details).  Clinical Course   Patient emergency department initially complaining of worsening right leg pain, history of femur fracture 4 months ago. Upon examination have noticed the patient has significant weakness of the right leg and foot. Also weakness in the right arm. MRIs of the lumbar spine and head ordered. Will get labs. Will get x-rays of the hip, leg, lumbar spine.   X-rays with no acute findings. Patient continues to have weakness. She states weakness must of been there for about a month or 2. I reviewed records. Pt with small CVA in 7//17 after her fracture, which she or her family are not aware of. Today's ct showing progression of CVA. Unable to get MRI, pt with cardiac recorder. I am not sure what type of recorder pt has, pt does not have a card with them, but states it was placed "yars ago  at [facility]." it is also appears that pt did have MRI in July.   Discussed with Dr. Tana Coast with [facility], will admit. Asked for neurolog consult.   Final Clinical Impressions(s) / ED Diagnoses   Final diagnoses:  Right leg pain  Cerebrovascular accident (CVA), unspecified mechanism [facility])    New Prescriptions Current Discharge Medication List       Jeannett Senior, PA-C 12/04/15 0950    Merrily Pew, MD 12/04/15 [phone]

## 2015-12-02 NOTE — ED Notes (Signed)
Patient taken to bathroom via wheelchair.

## 2015-12-02 NOTE — ED Notes (Addendum)
Pt NIH was a 4 due to rt leg pain/weakness and rt arm weakness. Pt passed swallow screen. Next neuro check due at 2000. Pt A&Ox4. Family at bedside. Pt c/o rt leg pain; oxycodone given at 1546. Pt CT of head showed subacute watershed infarct; all current xrays of femur negative; further xrays and MRI ordered and will be transported after imaging completed. Floor called and notified of delay. VSS. Pt has a loop recorder.

## 2015-12-02 NOTE — ED Notes (Signed)
Patient transported to X-ray 

## 2015-12-03 ENCOUNTER — Observation Stay (HOSPITAL_COMMUNITY): Payer: Medicare Other

## 2015-12-03 DIAGNOSIS — R531 Weakness: Secondary | ICD-10-CM | POA: Diagnosis not present

## 2015-12-03 DIAGNOSIS — S7291XS Unspecified fracture of right femur, sequela: Secondary | ICD-10-CM

## 2015-12-03 DIAGNOSIS — F4323 Adjustment disorder with mixed anxiety and depressed mood: Secondary | ICD-10-CM | POA: Diagnosis not present

## 2015-12-03 DIAGNOSIS — I63039 Cerebral infarction due to thrombosis of unspecified carotid artery: Secondary | ICD-10-CM

## 2015-12-03 DIAGNOSIS — Z87898 Personal history of other specified conditions: Secondary | ICD-10-CM | POA: Diagnosis not present

## 2015-12-03 DIAGNOSIS — Z9889 Other specified postprocedural states: Secondary | ICD-10-CM

## 2015-12-03 DIAGNOSIS — F1210 Cannabis abuse, uncomplicated: Secondary | ICD-10-CM

## 2015-12-03 DIAGNOSIS — F17210 Nicotine dependence, cigarettes, uncomplicated: Secondary | ICD-10-CM | POA: Diagnosis present

## 2015-12-03 DIAGNOSIS — R29898 Other symptoms and signs involving the musculoskeletal system: Secondary | ICD-10-CM | POA: Diagnosis not present

## 2015-12-03 DIAGNOSIS — R29704 NIHSS score 4: Secondary | ICD-10-CM | POA: Diagnosis present

## 2015-12-03 DIAGNOSIS — G9389 Other specified disorders of brain: Secondary | ICD-10-CM | POA: Diagnosis present

## 2015-12-03 DIAGNOSIS — Z7902 Long term (current) use of antithrombotics/antiplatelets: Secondary | ICD-10-CM | POA: Diagnosis not present

## 2015-12-03 DIAGNOSIS — Z8781 Personal history of (healed) traumatic fracture: Secondary | ICD-10-CM

## 2015-12-03 DIAGNOSIS — J449 Chronic obstructive pulmonary disease, unspecified: Secondary | ICD-10-CM | POA: Diagnosis present

## 2015-12-03 DIAGNOSIS — Z881 Allergy status to other antibiotic agents status: Secondary | ICD-10-CM | POA: Diagnosis not present

## 2015-12-03 DIAGNOSIS — Z720 Tobacco use: Secondary | ICD-10-CM

## 2015-12-03 DIAGNOSIS — Z86011 Personal history of benign neoplasm of the brain: Secondary | ICD-10-CM | POA: Diagnosis not present

## 2015-12-03 DIAGNOSIS — Z681 Body mass index (BMI) 19 or less, adult: Secondary | ICD-10-CM | POA: Diagnosis not present

## 2015-12-03 DIAGNOSIS — G40909 Epilepsy, unspecified, not intractable, without status epilepticus: Secondary | ICD-10-CM | POA: Diagnosis present

## 2015-12-03 DIAGNOSIS — I6522 Occlusion and stenosis of left carotid artery: Secondary | ICD-10-CM | POA: Diagnosis present

## 2015-12-03 DIAGNOSIS — Z8673 Personal history of transient ischemic attack (TIA), and cerebral infarction without residual deficits: Secondary | ICD-10-CM | POA: Diagnosis not present

## 2015-12-03 DIAGNOSIS — M79604 Pain in right leg: Secondary | ICD-10-CM

## 2015-12-03 DIAGNOSIS — I6789 Other cerebrovascular disease: Secondary | ICD-10-CM | POA: Diagnosis not present

## 2015-12-03 DIAGNOSIS — Z967 Presence of other bone and tendon implants: Secondary | ICD-10-CM

## 2015-12-03 DIAGNOSIS — R296 Repeated falls: Secondary | ICD-10-CM | POA: Diagnosis present

## 2015-12-03 DIAGNOSIS — E782 Mixed hyperlipidemia: Secondary | ICD-10-CM

## 2015-12-03 DIAGNOSIS — E785 Hyperlipidemia, unspecified: Secondary | ICD-10-CM | POA: Diagnosis present

## 2015-12-03 DIAGNOSIS — R55 Syncope and collapse: Secondary | ICD-10-CM | POA: Diagnosis not present

## 2015-12-03 DIAGNOSIS — I10 Essential (primary) hypertension: Secondary | ICD-10-CM | POA: Diagnosis present

## 2015-12-03 DIAGNOSIS — J438 Other emphysema: Secondary | ICD-10-CM

## 2015-12-03 DIAGNOSIS — I2510 Atherosclerotic heart disease of native coronary artery without angina pectoris: Secondary | ICD-10-CM | POA: Diagnosis present

## 2015-12-03 DIAGNOSIS — I639 Cerebral infarction, unspecified: Secondary | ICD-10-CM

## 2015-12-03 DIAGNOSIS — Z7951 Long term (current) use of inhaled steroids: Secondary | ICD-10-CM | POA: Diagnosis not present

## 2015-12-03 DIAGNOSIS — Z8669 Personal history of other diseases of the nervous system and sense organs: Secondary | ICD-10-CM | POA: Diagnosis not present

## 2015-12-03 DIAGNOSIS — S7291XA Unspecified fracture of right femur, initial encounter for closed fracture: Secondary | ICD-10-CM

## 2015-12-03 DIAGNOSIS — Z886 Allergy status to analgesic agent status: Secondary | ICD-10-CM | POA: Diagnosis not present

## 2015-12-03 DIAGNOSIS — E440 Moderate protein-calorie malnutrition: Secondary | ICD-10-CM | POA: Diagnosis not present

## 2015-12-03 DIAGNOSIS — I63032 Cerebral infarction due to thrombosis of left carotid artery: Secondary | ICD-10-CM | POA: Diagnosis not present

## 2015-12-03 DIAGNOSIS — G8191 Hemiplegia, unspecified affecting right dominant side: Secondary | ICD-10-CM | POA: Diagnosis present

## 2015-12-03 DIAGNOSIS — Z955 Presence of coronary angioplasty implant and graft: Secondary | ICD-10-CM | POA: Diagnosis not present

## 2015-12-03 LAB — LIPID PANEL
CHOL/HDL RATIO: 3.7 ratio
Cholesterol: 176 mg/dL (ref 0–200)
HDL: 47 mg/dL (ref >40)
LDL CALC: 90 mg/dL (ref 0–99)
Triglycerides: 196 mg/dL — ABNORMAL HIGH (ref <150)
VLDL: 39 mg/dL (ref 0–40)

## 2015-12-03 LAB — TSH: TSH: 1.044 u[IU]/mL (ref 0.350–4.500)

## 2015-12-03 NOTE — Progress Notes (Signed)
Rehab Admissions Coordinator Note:  Patient was screened by Retta Diones for appropriateness for an Inpatient Acute Rehab Consult.  At this time, we are recommending Inpatient Rehab consult.  Retta Diones 12/03/2015, 1:36 PM  I can be reached at [phone].

## 2015-12-03 NOTE — Consult Note (Signed)
Requesting Physician: IM    Chief Complaint: stroke  History obtained from:  Patient     HPI:                                                                                                                                         Wanda Griffin is an 60 y.o. female with history of anxiety, seizures, tobacco abuse, poor historian, presented to ED with falls, syncopal episode and right leg weakness. History was obtained from the patient and her husband in the room. The patient reported that she was going to the bathroom today when she had a syncopal episode in the evening. She denied any palpitations, chest pain or shortness of breath, dizziness or lightheadedness prior to the event. Per patient's family, they had noticed that she was progressively getting weaker in the right leg in the last 1 month and in the last 1 week, her right leg "just quit". Patient however did not seek any medical attention. Patient has been falling recently, last fall was 2 days ago. She denied any back pain, nausea or vomiting any diarrhea.  Patient also reported that her right arm also has been getting weaker.  Patient currently is very labile. When asked why she is not eating much she becomes tearful. She states that her right arm and right leg are weaker than her left and there is also decreased sensation.  Date last known well: Unable to determine Time last known well: Unable to determine tPA Given: No: no LSN  Past Medical History:  Diagnosis Date  . Altered mental status    /notes "has episodes ~ q month/family" (05/23/2012)  . Anxiety   . Benzodiazepine abuse   . Brain tumor ([facility]) 2012   Resected, had subsequent subdural hematoma  . COPD (chronic obstructive pulmonary disease) ([facility])    FEV1 36% predicted  . Depression   . IBS (irritable bowel syndrome)    chronic constipation  . Mental health disorder   . Seizures ([facility])   . 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 Subdural  hematoma ([facility]) 2012   After tumor resection  . Tobacco abuse     Past Surgical History:  Procedure Laterality Date  . BRAIN SURGERY  01/2012   Tumor resection  . CARDIAC CATHETERIZATION    . CORONARY ANGIOPLASTY WITH STENT PLACEMENT  ~ 2009   "1" (05/23/2012)  . DILATION AND CURETTAGE OF UTERUS    . FEMUR IM NAIL Right 07/28/2015   Procedure: REMOVAL OF TRACTION PIN INTRAMEDULLART (IM) NAIL FEMORAL;  Surgeon: Rod Can, MD;  Location: [facility];  Service: Orthopedics;  Laterality: Right;  . LOOP RECORDER IMPLANT    . SUBDURAL HEMATOMA EVACUATION VIA CRANIOTOMY Left 2009  . SUBDURAL HEMATOMA EVACUATION VIA CRANIOTOMY Right 2014  . TUBAL LIGATION    . VAGINAL HYSTERECTOMY  Family History  Problem Relation Age of Onset  . Hypertension Mother   . Hyperlipidemia Mother    Social History:  reports that she has been smoking Cigarettes.  She has a 10.00 pack-year smoking history. She has never used smokeless tobacco. She reports that she uses drugs, including Marijuana. She reports that she does not drink alcohol.  Allergies:  Allergies  Allergen Reactions  . Ampicillin Hives  . Asa [Aspirin] Hives    Other reaction(s): Angioedema (ALLERGY/intolerance)  . Coconut Oil Swelling  . Codeine Itching    Morphine ok  . Colchicine Other (See Comments)    Reaction unknown by patient  . Penicillins Hives    Has patient had a PCN reaction causing immediate rash, facial/tongue/throat swelling, SOB or lightheadedness with hypotension: Yes Has patient had a PCN reaction causing severe rash involving mucus membranes or skin necrosis: No Has patient had a PCN reaction that required hospitalization No Has patient had a PCN reaction occurring within the last 10 years: No If all of the above answers are "NO", then may proceed with Cephalosporin use.   . Toradol [Ketorolac Tromethamine] Hives  . Sulfamethoxazole Rash    Medications:                                                                                                                            Prior to Admission:  Prescriptions Prior to Admission  Medication Sig Dispense Refill Last Dose  . acetaminophen (TYLENOL) 500 MG tablet Take 2,000 mg by mouth daily as needed for headache (or pain).    12/01/2015 at pm  . ALPRAZolam (XANAX) 0.25 MG tablet Take 1 tablet (0.25 mg total) by mouth 2 (two) times daily as needed for anxiety. (Patient taking differently: Take 0.25 mg by mouth every 12 (twelve) hours as needed for anxiety. ) 10 tablet 0 Past Month at Unknown time  . budesonide-formoterol (SYMBICORT) 160-4.5 MCG/ACT inhaler Inhale 2 puffs into the lungs 2 (two) times daily as needed.    Past Week at Unknown time  . clopidogrel (PLAVIX) 75 MG tablet Take 1 tablet (75 mg total) by mouth daily.   11/30/2015 at pm  . divalproex (DEPAKOTE) 500 MG DR tablet Take 500 mg by mouth 3 (three) times daily.   12/01/2015 at pm  . oxyCODONE-acetaminophen (PERCOCET/ROXICET) 5-325 MG tablet Take 1 tablet by mouth every 8 (eight) hours as needed for severe pain. 12 tablet 0 Past Month at Unknown time  . QUEtiapine (SEROQUEL) 25 MG tablet Take 1 tablet (25 mg total) by mouth 2 (two) times daily. (Patient taking differently: Take 25 mg by mouth at bedtime. )   12/01/2015 at pm  . zolpidem (AMBIEN) 10 MG tablet Take 5-10 mg by mouth at bedtime as needed for sleep.   Past Week at Unknown time  . enoxaparin (LOVENOX) 40 MG/0.4ML injection Inject 0.4 mLs (40 mg total) into the skin daily. For 30 days post op (Patient not taking: Reported on 12/02/2015)  0 Syringe  Not Taking at Unknown time    ROS:                                                                                                                                       History obtained from the patient  General ROS: negative for - chills, fatigue, fever, night sweats, weight gain or weight loss Psychological ROS: negative for - behavioral disorder, hallucinations, memory difficulties, mood  swings or suicidal ideation Ophthalmic ROS: negative for - blurry vision, double vision, eye pain or loss of vision ENT ROS: negative for - epistaxis, nasal discharge, oral lesions, sore throat, tinnitus or vertigo Allergy and Immunology ROS: negative for - hives or itchy/watery eyes Hematological and Lymphatic ROS: negative for - bleeding problems, bruising or swollen lymph nodes Endocrine ROS: negative for - galactorrhea, hair pattern changes, polydipsia/polyuria or temperature intolerance Respiratory ROS: negative for - cough, hemoptysis, shortness of breath or wheezing Cardiovascular ROS: negative for - chest pain, dyspnea on exertion, edema or irregular heartbeat Gastrointestinal ROS: negative for - abdominal pain, diarrhea, hematemesis, nausea/vomiting or stool incontinence Genito-Urinary ROS: negative for - dysuria, hematuria, incontinence or urinary frequency/urgency Musculoskeletal ROS: negative for - joint swelling or muscular weakness Neurological ROS: as noted in HPI Dermatological ROS: negative for rash and skin lesion changes  Neurologic Examination:                                                                                                      Blood pressure 125/71, pulse 78, temperature 99.1 F (37.3 C), temperature source Oral, resp. rate 16, height 5\' 2"  (1.575 m), weight 41 kg (90 lb 6.4 oz), SpO2 90 %.  HEENT-  Normocephalic, no lesions, without obvious abnormality.  Normal external eye and conjunctiva.  Normal TM's bilaterally.  Normal auditory canals and external ears. Normal external nose, mucus membranes and septum.  Normal pharynx. Cardiovascular- S1, S2 normal, pulses palpable throughout   Lungs- chest clear, no wheezing, rales, normal symmetric air entry Abdomen- normal findings: bowel sounds normal Extremities- no clubbing Lymph-no adenopathy palpable Musculoskeletal-no joint tenderness, deformity or swelling Skin-warm and dry, no hyperpigmentation, vitiligo,  or suspicious lesions  Neurological Examination Mental Status: Alert, oriented, tearful.  Speech fluent without evidence of aphasia.  Able to follow 3 step commands without difficulty. Cranial Nerves: II: ; Visual fields grossly normal, pupils equal, round, reactive to light and accommodation III,IV, VI: ptosis not present, extra-ocular motions intact bilaterally V,VII: smile symmetric, facial light touch sensation  normal bilaterally VIII: hearing normal bilaterally IX,X: uvula rises symmetrically XI: bilateral shoulder shrug XII: midline tongue extension Motor: Right : Upper extremity   1/5 proximally with 4/5 distally Left:     Upper extremity   5/5  Lower extremity   1/5      Lower extremity   5/5 Tone and bulk:normal tone throughout; no atrophy noted Sensory: Pinprick and light touch decreased on the right arm and leg Deep Tendon Reflexes: Depressed at 1+ throughout Plantars: Right: downgoing   Left: downgoing Cerebellar: normal finger-to-nose on the left and normal heel-to-shin test on the left Gait: Not tested       Lab Results: Basic Metabolic Panel:  Recent Labs Lab 12/02/15 1638  NA 140  K 3.5  CL 103  CO2 25  GLUCOSE 85  BUN 7  CREATININE 0.66  CALCIUM 9.7    Liver Function Tests: No results for input(s): AST, ALT, ALKPHOS, BILITOT, PROT, ALBUMIN in the last 168 hours. No results for input(s): LIPASE, AMYLASE in the last 168 hours. No results for input(s): AMMONIA in the last 168 hours.  CBC:  Recent Labs Lab 12/02/15 1638  WBC 7.0  NEUTROABS 3.5  HGB 14.6  HCT 44.3  MCV 89.9  PLT 205    Cardiac Enzymes: No results for input(s): CKTOTAL, CKMB, CKMBINDEX, TROPONINI in the last 168 hours.  Lipid Panel:  Recent Labs Lab 12/03/15 0700  CHOL 176  TRIG 196*  HDL 47  CHOLHDL 3.7  VLDL 39  LDLCALC 90    CBG: No results for input(s): GLUCAP in the last 168 hours.  Microbiology: Results for orders placed or performed during the  hospital encounter of 07/27/15  MRSA PCR Screening     Status: None   Collection Time: 07/27/15 11:19 PM  Result Value Ref Range Status   MRSA by PCR NEGATIVE NEGATIVE Final    Comment:        The GeneXpert MRSA Assay (FDA approved for NASAL specimens only), is one component of a comprehensive MRSA colonization surveillance program. It is not intended to diagnose MRSA infection nor to guide or monitor treatment for MRSA infections.   Urine culture     Status: None   Collection Time: 07/28/15 10:27 AM  Result Value Ref Range Status   Specimen Description URINE, CATHETERIZED  Final   Special Requests NONE  Final   Culture NO GROWTH  Final   Report Status 07/29/2015 FINAL  Final    Coagulation Studies: No results for input(s): LABPROT, INR in the last 72 hours.  Imaging: Dg Lumbar Spine Complete  Result Date: 12/02/2015 CLINICAL DATA:  Pain following recent fall EXAM: LUMBAR SPINE - COMPLETE 4+ VIEW COMPARISON:  None. FINDINGS: Frontal, lateral, spot lumbosacral lateral, and bilateral oblique views were obtained. There are 5 non-rib-bearing lumbar type vertebral bodies. There is no fracture or spondylolisthesis. Disc spaces appear normal. There is no appreciable facet arthropathy. There is extensive aortoiliac atherosclerotic calcification. IMPRESSION: No fracture or spondylolisthesis. No appreciable disc space narrowing or facet arthropathy. There is extensive aortoiliac atherosclerosis. Electronically Signed   By: Lowella Grip III M.D.   On: 12/02/2015 15:21   Ct Head Wo Contrast  Addendum Date: 12/02/2015   ADDENDUM REPORT: 12/02/2015 17:16 ADDENDUM: Case discussed with clinical service; patient has ~2 months of right leg weakness. Since 08/02/15 subacute to chronic infarct has developed in the distal left ACA territory along the parasagittal left frontal lobe. This infarct is confluent with the patient's more chronic watershed infarct.  Electronically Signed   By: Monte Fantasia M.D.   On: 12/02/2015 17:16   Result Date: 12/02/2015 CLINICAL DATA:  Right-sided weakness.  Fall. EXAM: CT HEAD WITHOUT CONTRAST TECHNIQUE: Contiguous axial images were obtained from the base of the skull through the vertex without intravenous contrast. COMPARISON:  08/02/2015 FINDINGS: Brain: Gliosis in the left temporal pole that is stable from prior. There is cortically based gliosis along the vertex of the left cerebral hemisphere, watershed infarct pattern in this patient with chronic left ICA occlusion. Remote left caudate head infarct. No acute infarct, hemorrhage, hydrocephalus, or mass. Vascular: Asymmetric carotid siphon calcification on the left. Skull: Remote left occipital bone fracture. Remote bilateral craniotomy. Sinuses/Orbits: Negative Other: None IMPRESSION: 1. No acute finding. 2. 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 3. Left temporal pole gliosis, possibly posttraumatic given location and remote left occipital fracture. Electronically Signed: By: Monte Fantasia M.D. On: 12/02/2015 16:55   Mr Jodene Nam Head Wo Contrast  Result Date: 12/02/2015 CLINICAL DATA:  Syncope and right leg weakness. EXAM: MRA HEAD WITHOUT CONTRAST TECHNIQUE: Angiographic images of the Circle of Willis were obtained using MRA technique without intravenous contrast. COMPARISON:  Brain MRI same day, CTA head 08/02/2015 FINDINGS: The examination is markedly degraded by patient motion. Intracranial internal carotid arteries: The right internal carotid artery is grossly normal, though obscured by motion. The left internal carotid artery remains occluded, as demonstrated on prior CTA. Anterior cerebral arteries: Normal. Middle cerebral arteries: The left MCA is diminutive compared to the right. The distal MCA distribution circulation is mildly decreased on the left. Posterior communicating arteries: Not visualized Posterior cerebral arteries: Normal. Basilar artery: Normal. Vertebral  arteries: Right dominant Normal. Superior cerebellar arteries: Normal. Anterior inferior cerebellar arteries: Not clearly visualized, which is not uncommon. Posterior inferior cerebellar arteries: Normal on the left. Not clearly visualized on the right. IMPRESSION: 1. Despite efforts by the technologist and patient, motion artifact is present on today's examination and could not be eliminated. This reduces the sensitivity and specificity of the study. 2. Within the above limitation, there is unchanged occlusion of the left internal carotid artery with attenuation of the left MCA, and slightly decreased left distal MCA distribution circulation. 3. No other identified high-grade stenosis or occlusion. Electronically Signed   By: Ulyses Jarred M.D.   On: 12/02/2015 22:52   Mr Brain Wo Contrast  Result Date: 12/02/2015 CLINICAL DATA:  Right leg weakness EXAM: MRI HEAD WITHOUT CONTRAST TECHNIQUE: Multiplanar, multiecho pulse sequences of the brain and surrounding structures were obtained without intravenous contrast. COMPARISON:  Head CT 12/02/2015, brain MRI 08/01/2015 FINDINGS: Brain: There is multifocal diffusion restriction within the left frontal and anterior parietal lobes in a watershed distribution. The midline structures are normal. Areas of cystic change in the left watershed distribution and anterior left temporal lobe are unchanged. There is confluent hyperintense T2-weighted signal within the periventricular white matter, most often seen in the setting of chronic microvascular ischemia. No mass lesion or midline shift. No hydrocephalus or extra-axial fluid collection. Vascular: Chronic occlusion of the left internal carotid artery. No evidence of chronic microhemorrhage or amyloid angiopathy. Skull and upper cervical spine: Changes of remote bilateral craniotomies. Sinuses/Orbits: No fluid levels or advanced mucosal thickening. No mastoid effusion. Normal orbits. IMPRESSION: 1. Acute left frontoparietal  watershed distribution infarct, superimposed on chronic infarct in this location. No acute hemorrhage or significant mass effect. 2. Findings of multiple remote infarcts and multiple areas of encephalomalacia including the left frontal and  parietal lobes and both anterior temporal lobes. 3. Chronic occlusion of the left internal carotid artery. These results will be called to the ordering clinician or representative by the Radiologist Assistant, and communication documented in the PACS or zVision Dashboard. Electronically Signed   By: Ulyses Jarred M.D.   On: 12/02/2015 19:34   Dg Hip Unilat With Pelvis 2-3 Views Right  Result Date: 12/02/2015 CLINICAL DATA:  Right hip pain for 2 months. EXAM: DG HIP (WITH OR WITHOUT PELVIS) 2-3V RIGHT COMPARISON:  Hip radiograph 12/02/2015 FINDINGS: There is IIA right femoral antegrade intra medullary rod with interlocking component in the right femoral neck. Callus formation along the midshaft of the right femur is again noted. There is no acute hardware abnormality identified. The joint space of the right hip is unchanged. No pelvic fracture. IMPRESSION: Unchanged appearance of right femoral intra medullary rod traversing side of prior femoral fracture. Electronically Signed   By: Ulyses Jarred M.D.   On: 12/02/2015 18:57   Dg Hip Unilat W Or Wo Pelvis 2-3 Views Right  Result Date: 12/02/2015 CLINICAL DATA:  Pain with recent falls EXAM: DG HIP (WITH OR WITHOUT PELVIS) 2-3V RIGHT COMPARISON:  July 28, 2015 FINDINGS: Frontal pelvis as well as frontal and lateral right hip images were obtained. There is evidence of previous surgical fixation for fracture of the mid right femur. There is a screw with its tip in the proximal right femoral head. There is evidence of an old healed fracture of the left ischium. No acute fracture or dislocation. Joint spaces appear normal. No erosive change. There is extensive iliac artery atherosclerosis. IMPRESSION: Evidence of old trauma. No  acute fracture or dislocation. There is iliac artery atherosclerosis bilaterally. Joint spaces appear intact. Electronically Signed   By: Lowella Grip III M.D.   On: 12/02/2015 15:37   Dg Femur Min 2 Views Right  Result Date: 12/02/2015 CLINICAL DATA:  Pain following recent fall EXAM: RIGHT FEMUR 2 VIEWS COMPARISON:  July 28, 2015 FINDINGS: Frontal and lateral views were obtained. There is screw and rod fixation through a prior fracture of the mid right femur with bony remodeling at the fracture site. No acute fracture or dislocation. There is no knee joint effusion. There is mild osteoarthritic change in the right knee joint. IMPRESSION: Postoperative change with remodeling. No acute fracture or dislocation. Mild osteoarthritic change right knee joint. No knee joint effusion. Electronically Signed   By: Lowella Grip III M.D.   On: 12/02/2015 15:22    Vascular Ultrasound Carotid Duplex (Doppler) has been completed.   Findings suggest 1-39% right internal carotid artery stenosis.  The left internal carotid artery is chronically occluded.  There is a left carotid to brachial artery saphenous vein bypass; the anastomosis at the left carotid artery is patent. Unable to visualize the bypass in its entirety due to patient body habitus.  Vertebral arteries are patent with antegrade flow bilaterally  ECHO PENDING LDL 90  Assessment and plan discussed with with attending physician and they are in agreement.    Etta Quill PA-C [facility] [phone]  12/03/2015, 11:25 AM   Assessment: 60 y.o. female with left-sided acute watershed distribution infarct. MRA shows an occluded left ICA. She was outside of the TPA window. Patient will need further workup and smoking cessation.  Stroke Risk Factors - hyperlipidemia, hypertension and smoking  Recommend   Plan: 1. HgbA1c, 2. MRI, MRA  of the brain without contrast 3. PT consult, OT consult, Speech consult 4.  Echocardiogram  6. Prophylactic therapy-Antiplatelet med: Plavix - dose daily 7. Risk  Factor modification

## 2015-12-03 NOTE — Progress Notes (Addendum)
*  PRELIMINARY RESULTS* Vascular Ultrasound Carotid Duplex (Doppler) has been completed.   Findings suggest 1-39% right internal carotid artery stenosis.  The left internal carotid artery is chronically occluded.  There is a left carotid to brachial artery saphenous vein bypass; the anastomosis at the left carotid artery is patent. Unable to visualize the bypass in its entirety due to patient body habitus.  Vertebral arteries are patent with antegrade flow bilaterally. The left vertebral artery exhibits an atypical flow, possibly due to history of subclavian steal.    Bilateral lower extremity venous duplex completed. Bilateral lower extremities are negative for deep vein thrombosis. There is no evidence of Baker's cyst bilaterally.   12/03/2015 10:41 AM Maudry Mayhew, BS, RVT, RDCS, RDMS

## 2015-12-03 NOTE — Evaluation (Signed)
Physical Therapy Evaluation Patient Details Name: Wanda Griffin MRN: [id number] DOB: 01/09/1956 Today's Date: 12/03/2015   History of Present Illness  60 y.o.femalewith history of anxiety, seizures, 07/2015 fall with Rt femur ORIF, tobacco abuse, poor historian, presented to ED with falls, syncopal episode and right leg weakness. Per patient's family, she was progressively getting weaker in the right leg in the last 1 month and in the last 1 week, her right leg "just quit". Patient however did not seek any medical attention. Patient has been falling recently, last fall was 2 days ago. MRI-Acute left frontoparietal watershed distribution infarct, superimposed on chronic infarct in this location. Chronic occlusion of the left internal carotid artery; multiple remote infarcts left frontal and parietal lobes and both anterior temporal lobes    Clinical Impression  Pt admitted with above diagnosis. Patient significantly limited by Rt thigh pain (?posterior, ?cramping--pt with difficulty expressing verbally). Noted negative xrays Rt hip, pelvis, femur. Right leg resting position in external rotation, mild abduction, and flexion with ?tone vs guarding due to pain.   Pt currently with functional limitations due to the deficits listed below (see PT Problem List). Patient with grossly 4/5 strength Rt arm (when attending to arm) and may be able to use RW for mobility. It is unclear if her home is wheelchair accessible. Pt will benefit from skilled PT to increase their independence and safety with mobility to allow discharge to the venue listed below.       Follow Up Recommendations CIR (assuming pt becomes inpatient status (acute infarct))    Equipment Recommendations  Wheelchair (measurements PT);Wheelchair cushion (measurements PT);3in1 (PT) (if no CIR)    Recommendations for Other Services Rehab consult;OT consult;Speech consult     Precautions / Restrictions Precautions Precautions: Fall;Other  (comment) Precaution Comments: pt labile; word-finding problems      Mobility  Bed Mobility Overal bed mobility: Needs Assistance Bed Mobility: Supine to Sit;Sit to Supine     Supine to sit: Mod assist;HOB elevated Sit to supine: Min assist   General bed mobility comments: exit to left; assist to move RLE and scoot pelvis to EOB; pt very limited by pain rt thigh  Transfers                 General transfer comment: unable due to pain/tears  Ambulation/Gait             General Gait Details: unable due to pain/tears  Stairs            Wheelchair Mobility    Modified Rankin (Stroke Patients Only) Modified Rankin (Stroke Patients Only) Pre-Morbid Rankin Score: No significant disability (pt with expressive difficulties; best understanding) Modified Rankin: Severe disability     Balance Overall balance assessment: Needs assistance Sitting-balance support: Bilateral upper extremity supported;Feet unsupported Sitting balance-Leahy Scale: Poor Sitting balance - Comments: posterior lean                                     Pertinent Vitals/Pain Pain Assessment: Faces Faces Pain Scale: Hurts worst Pain Location: Rt thigh Pain Descriptors / Indicators: Cramping (pt with difficulty describing) Pain Intervention(s): Limited activity within patient's tolerance;Monitored during session;Repositioned    Home Living Family/patient expects to be discharged to:: Private residence Living Arrangements: Spouse/significant other Available Help at Discharge: Family;Available 24 hours/day Type of Home: House Home Access: Ramped entrance     Home Layout: One level Home Equipment: Gilford Rile -  2 wheels Additional Comments: per pt; ?complete/accurate due to word-finding issues    Prior Function Level of Independence: Independent (one month ago)               Hand Dominance   Dominant Hand: Right    Extremity/Trunk Assessment   Upper Extremity  Assessment: Defer to OT evaluation (RUE grossly 4/5 biceps/triceps; anticipate can use RW)           Lower Extremity Assessment: RLE deficits/detail (LLE WNL) RLE Deficits / Details: 0/5 strength throughout; resting posture with hip external rotation(~40 degrees), abduction (15 degrees) and knee flexion (20 degrees); PROM WNL however with some incr pain Rt thigh. Noted negative rt hip/pelvis/femur films. Appears to be flexor tone developing    Cervical / Trunk Assessment:  (difficult to assess due to limited sitting)  Communication   Communication: Expressive difficulties  Cognition Arousal/Alertness: Awake/alert Behavior During Therapy:  (labile/tearful) Overall Cognitive Status: Difficult to assess                      General Comments      Exercises     Assessment/Plan    PT Assessment Patient needs continued PT services  PT Problem List Decreased strength;Decreased activity tolerance;Decreased balance;Decreased mobility;Decreased knowledge of use of DME;Impaired sensation;Impaired tone;Pain          PT Treatment Interventions DME instruction;Gait training;Functional mobility training;Therapeutic activities;Balance training;Neuromuscular re-education;Patient/family education;Wheelchair mobility training    PT Goals (Current goals can be found in the Care Plan section)  Acute Rehab PT Goals Patient Stated Goal: cannot verbalize her own goals PT Goal Formulation: With patient Time For Goal Achievement: 12/10/15 Potential to Achieve Goals: Fair (depending on pain control)    Frequency Min 4X/week   Barriers to discharge        Co-evaluation               End of Session   Activity Tolerance: Patient limited by pain Patient left: in bed;with call bell/phone within reach;with bed alarm set      Functional Assessment Tool Used: clinical judgement Functional Limitation: Mobility: Walking and moving around Mobility: Walking and Moving Around Current  Status [id number]): At least 80 percent but less than 100 percent impaired, limited or restricted Mobility: Walking and Moving Around Goal Status [phone]): At least 20 percent but less than 40 percent impaired, limited or restricted    Time: [id number] PT Time Calculation (min) (ACUTE ONLY): 29 min   Charges:   PT Evaluation $PT Eval Moderate Complexity: 1 Procedure PT Treatments $Therapeutic Activity: 8-22 mins   PT G Codes:   PT G-Codes **NOT FOR INPATIENT CLASS** Functional Assessment Tool Used: clinical judgement Functional Limitation: Mobility: Walking and moving around Mobility: Walking and Moving Around Current Status [id number]): At least 80 percent but less than 100 percent impaired, limited or restricted Mobility: Walking and Moving Around Goal Status [phone]): At least 20 percent but less than 40 percent impaired, limited or restricted    Gerson Fauth 12/03/2015, 12:44 PM Pager [phone]

## 2015-12-03 NOTE — Progress Notes (Signed)
OT Cancellation Note  Patient Details Name: Wanda Griffin MRN: [id number] DOB: 06-May-1955   Cancelled Treatment:    Reason Eval/Treat Not Completed: Patient at procedure or test/ unavailable. Will follow up for OT eval as time allows.  Binnie Kand M.S., OTR/L Pager: [phone]  12/03/2015, 9:13 AM

## 2015-12-03 NOTE — Evaluation (Addendum)
Speech Language Pathology Evaluation Patient Details Name: Wanda Griffin MRN: [id number] DOB: 1955-05-20 Today's Date: 12/03/2015 Time: [id number] SLP Time Calculation (min) (ACUTE ONLY): 17 min  Problem List:  Patient Active Problem List   Diagnosis Date Noted  . Right leg weakness 12/02/2015  . CVA (cerebral vascular accident) ([place]) 08/01/2015  . Other depression due to general medical condition   . Malnutrition of moderate degree 07/30/2015  . Femur fracture, right, closed, initial encounter 07/28/2015  . Femur fracture ([place]) 07/27/2015  . Anxiety and depression 07/27/2015  . Tobacco abuse 07/27/2015  . Macrocytosis without anemia 07/27/2015  . Hyperlipidemia 07/27/2015  . Chronic constipation 07/27/2015  . COPD (chronic obstructive pulmonary disease) ([place]) 07/27/2015  . Femur fracture, right ([place]) 07/27/2015  . Altered mental status 05/24/2012  . Seizure disorder ([place]) 05/24/2012  . Syncope and collapse 05/24/2012  . Polysubstance (excluding opioids) dependence, daily use ([place]) 05/24/2012   Past Medical History:  Past Medical History:  Diagnosis Date  . Altered mental status    /notes "has episodes ~ q month/family" (05/23/2012)  . Anxiety   . Benzodiazepine abuse   . Brain tumor ([place]) 2012   Resected, had subsequent subdural hematoma  . COPD (chronic obstructive pulmonary disease) ([facility])    FEV1 36% predicted  . Depression   . IBS (irritable bowel syndrome)    chronic constipation  . Mental health disorder   . Seizures ([place])   . 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 Subdural hematoma ([place]) 2012   After tumor resection  . Tobacco abuse    Past Surgical History:  Past Surgical History:  Procedure Laterality Date  . BRAIN SURGERY  01/2012   Tumor resection  . CARDIAC CATHETERIZATION    . CORONARY ANGIOPLASTY WITH STENT PLACEMENT  ~ 2009   "1" (05/23/2012)  . DILATION AND CURETTAGE OF UTERUS    . FEMUR IM NAIL Right 07/28/2015    Procedure: REMOVAL OF TRACTION PIN INTRAMEDULLART (IM) NAIL FEMORAL;  Surgeon: Rod Can, MD;  Location: [facility];  Service: Orthopedics;  Laterality: Right;  . LOOP RECORDER IMPLANT    . SUBDURAL HEMATOMA EVACUATION VIA CRANIOTOMY Left 2009  . SUBDURAL HEMATOMA EVACUATION VIA CRANIOTOMY Right 2014  . TUBAL LIGATION    . VAGINAL HYSTERECTOMY     HPI:  60 year old female with history of anxiety, seizures, tobacco abuse, subdural hematoma after brain tumor resection 2012,  presented to ED with falls, syncopal episode and right leg weakness. Per chart , patient's family noticed progress weakness in the right leg in the last 1 month. Patient however did not seek any medical attention. Acute left frontoparietal watershed distribution infarct, superimposed on chronic infarct in this location, Findings of multiple remote infarcts and multiple areas of encephalomalacia including the left frontal and parietal lobes and both anterior temporal lobes.   Assessment / Plan / Recommendation Clinical Impression  Pt scored 12 out of 22 on the MoCA-Blind assessment (for visual impairment) with specific difficulty in the areas of working memory (Advertising account executive), reasoning and abstraction. Pt unable to state reason for weight loss and when questioned answered affirmately she didn't have food or a way to get it and stated she did not eat lunch today "wasn't hungry". SLP read some of menu to pt for food preferences. Pt became teary when talking about benefits of short term SNF. SLP observed consistent self doubt for most questions on cognitive assessment before attempting to answer. ST will continue to  follow for cognition. Recommend social work (hospital or SNF) to help determine home situation and need for assistance.    SLP Assessment  Patient needs continued Speech Lanaguage Pathology Services    Follow Up Recommendations  Skilled Nursing facility    Frequency and Duration min 2x/week  2 weeks       SLP Evaluation Cognition  Overall Cognitive Status: No family/caregiver present to determine baseline cognitive functioning Arousal/Alertness: Awake/alert Orientation Level: Oriented X4 Attention: Sustained Sustained Attention: Appears intact Memory: Impaired Memory Impairment: Storage deficit;Retrieval deficit;Decreased recall of new information;Decreased short term memory Decreased Short Term Memory: Verbal basic Awareness: Appears intact Problem Solving:  (to be further assessed) Safety/Judgment: Impaired       Comprehension  Auditory Comprehension Overall Auditory Comprehension: Appears within functional limits for tasks assessed Visual Recognition/Discrimination Discrimination: Not tested Reading Comprehension Reading Status:  (difficulty with vision; unable to see print)    Expression Expression Primary Mode of Expression: Verbal Verbal Expression Overall Verbal Expression: Appears within functional limits for tasks assessed Initiation: No impairment Level of Generative/Spontaneous Verbalization: Conversation Repetition: No impairment Naming: No impairment Pragmatics: No impairment Written Expression Dominant Hand: Right Written Expression: Not tested   Oral / Motor  Oral Motor/Sensory Function Overall Oral Motor/Sensory Function: Within functional limits Motor Speech Overall Motor Speech: Appears within functional limits for tasks assessed Respiration: Within functional limits Phonation: Normal Resonance: Within functional limits Articulation: Within functional limitis Intelligibility: Intelligible Motor Planning: Witnin functional limits   GO          Functional Assessment Tool Used: skilled clinical judgement Functional Limitations: Memory Memory Current Status [id number]): At least 80 percent but less than 100 percent impaired, limited or restricted Memory Goal Status [id number]): At least 60 percent but less than 80 percent impaired, limited or  restricted         Houston Siren 12/03/2015, 2:43 PM  Orbie Pyo Colvin Caroli.[employer] [phone]

## 2015-12-03 NOTE — Consult Note (Signed)
Physical Medicine and Rehabilitation Consult Reason for Consult: Acute watershed distribution infarct Referring Physician: [facility]   HPI: Wanda Griffin is a 60 y.o. 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 Per chart review and patient, patient lives with spouse. Reportedly used walker prior to admission. One level home with ramp entrance. Presented 12/02/2015 with progressive right sided weakness question syncopal event. Reported weight loss of approximately 25 pounds. MRI showed acute left frontoparietal watershed distribution infarct, superimposed on chronic infarct in this location. Findings of multiple remote infarcts in multiple areas of encephalomalacia. 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 Carotid Dopplers with chronically occluded left ICA. Venous Doppler studies lower extremities negative for DVT. Echocardiogram pending. Patient did not receive TPA. Neurology consulted presently remains on Plavix for CVA prophylaxis. Subcutaneous Lovenox for DVT prophylaxis. Physical therapy evaluation completed 12/03/2015 with recommendations of physical medicine rehabilitation consult.   Review of Systems  Constitutional: Positive for malaise/fatigue and weight loss. Negative for chills and fever.  HENT: Negative for hearing loss.   Eyes: Negative for blurred vision and double vision.  Respiratory: Positive for cough and shortness of breath.   Cardiovascular: Negative for chest pain, palpitations and leg swelling.  Gastrointestinal: Positive for constipation. Negative for nausea and vomiting.  Genitourinary: Negative for dysuria and hematuria.  Musculoskeletal: Positive for joint pain and myalgias.  Skin: Negative for rash.    Neurological: Positive for seizures, weakness and headaches.  Psychiatric/Behavioral: Positive for depression.       Anxiety  All other systems reviewed and are negative.  Past Medical History:  Diagnosis Date  . Altered mental status    /notes "has episodes ~ q month/family" (05/23/2012)  . Anxiety   . Benzodiazepine abuse   . Brain tumor ([place]) 2012   Resected, had subsequent subdural hematoma  . COPD (chronic obstructive pulmonary disease) (H[facility])    FEV1 36% predicted  . Depression   . IBS (irritable bowel syndrome)    chronic constipation  . Mental health disorder   . Seizures ([place])   . 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 Subdural hematoma ([place]) 2012   After tumor resection  . Tobacco abuse    Past Surgical History:  Procedure Laterality Date  . BRAIN SURGERY  01/2012   Tumor resection  . CARDIAC CATHETERIZATION    . CORONARY ANGIOPLASTY WITH STENT PLACEMENT  ~ 2009   "1" (05/23/2012)  . DILATION AND CURETTAGE OF UTERUS    . FEMUR IM NAIL Right 07/28/2015   Procedure: REMOVAL OF TRACTION PIN INTRAMEDULLART (IM) NAIL FEMORAL;  Surgeon: Rod Can, MD;  Location: [facility];  Service: Orthopedics;  Laterality: Right;  . LOOP RECORDER IMPLANT    . SUBDURAL HEMATOMA EVACUATION VIA CRANIOTOMY Left 2009  . SUBDURAL HEMATOMA EVACUATION VIA CRANIOTOMY Right 2014  . TUBAL LIGATION    . VAGINAL HYSTERECTOMY     Family History  Problem Relation Age of Onset  . Hypertension Mother   . Hyperlipidemia Mother    Social History:  reports that she has been smoking Cigarettes.  She has a 10.00 pack-year smoking history. She has never used smokeless tobacco. She reports that she uses drugs, including Marijuana. She reports that she does not drink alcohol.  Allergies:  Allergies  Allergen Reactions  . Ampicillin Hives  . Asa [Aspirin] Hives    Other reaction(s): Angioedema (ALLERGY/intolerance)  . Coconut Oil Swelling  . Codeine Itching    Morphine ok   . Colchicine Other (See Comments)    Reaction unknown by patient  . Penicillins Hives    Has patient had a PCN reaction causing immediate rash, facial/tongue/throat swelling, SOB or lightheadedness with hypotension: Yes Has patient had a PCN reaction causing severe rash involving mucus membranes or skin necrosis: No Has patient had a PCN reaction that required hospitalization No Has patient had a PCN reaction occurring within the last 10 years: No If all of the above answers are "NO", then may proceed with Cephalosporin use.   . Toradol [Ketorolac Tromethamine] Hives  . Sulfamethoxazole Rash   Medications Prior to Admission  Medication Sig Dispense Refill  . acetaminophen (TYLENOL) 500 MG tablet Take 2,000 mg by mouth daily as needed for headache (or pain).     Marland Kitchen ALPRAZolam (XANAX) 0.25 MG tablet Take 1 tablet (0.25 mg total) by mouth 2 (two) times daily as needed for anxiety. (Patient taking differently: Take 0.25 mg by mouth every 12 (twelve) hours as needed for anxiety. ) 10 tablet 0  . budesonide-formoterol (SYMBICORT) 160-4.5 MCG/ACT inhaler Inhale 2 puffs into the lungs 2 (two) times daily as needed.     . clopidogrel (PLAVIX) 75 MG tablet Take 1 tablet (75 mg total) by mouth daily.    . divalproex (DEPAKOTE) 500 MG DR tablet Take 500 mg by mouth 3 (three) times daily.    Marland Kitchen oxyCODONE-acetaminophen (PERCOCET/ROXICET) 5-325 MG tablet Take 1 tablet by mouth every 8 (eight) hours as needed for severe pain. 12 tablet 0  . QUEtiapine (SEROQUEL) 25 MG tablet Take 1 tablet (25 mg total) by mouth 2 (two) times daily. (Patient taking differently: Take 25 mg by mouth at bedtime. )    . zolpidem (AMBIEN) 10 MG tablet Take 5-10 mg by mouth at bedtime as needed for sleep.    Marland Kitchen enoxaparin (LOVENOX) 40 MG/0.4ML injection Inject 0.4 mLs (40 mg total) into the skin daily. For 30 days post op (Patient not taking: Reported on 12/02/2015) 0 Syringe     Home: Home Living Family/patient expects to be  discharged to:: Private residence Living Arrangements: Spouse/significant other Available Help at Discharge: Family, Available 24 hours/day Type of Home: House Home Access: [place]: One [place]: Environmental consultant - 2 wheels Additional Comments: per pt; ?complete/accurate due to word-finding issues  Functional History: Prior Function Level of Independence: Independent (one month ago) Functional Status:  Mobility: Bed Mobility Overal bed mobility: Needs Assistance Bed Mobility: Supine to Sit, Sit to Supine Supine to sit: Mod assist, HOB elevated Sit to supine: Min assist General bed mobility comments: exit to left; assist to move RLE and scoot pelvis to EOB; pt very limited by pain rt thigh Transfers General transfer comment: unable due to pain/tears Ambulation/Gait General Gait Details: unable due to pain/tears    ADL:    Cognition: Cognition Overall Cognitive Status: Difficult to assess Orientation Level: Oriented X4 Cognition Arousal/Alertness: Awake/alert Behavior During Therapy:  (labile/tearful) Overall Cognitive Status: Difficult to assess Difficult to assess due to: Impaired communication  Blood pressure 125/71, pulse 78, temperature 99.1 F (37.3 C), temperature source Oral, resp. rate 16, height 5\' 2"  (1.575 m), weight 41 kg (90 lb 6.4 oz), SpO2 90 %. Physical Exam  Vitals reviewed. Constitutional: No distress.  60 year old frail right-handed  female appearing older than stated age  HENT:  Head: Normocephalic and atraumatic.  Eyes: Conjunctivae and EOM are normal.  Neck: Normal range of motion. Neck supple. No thyromegaly present.  Cardiovascular: Normal rate and regular rhythm.   Respiratory: Effort normal and breath sounds normal. No respiratory distress.  GI: Soft. Bowel sounds are normal. She exhibits no distension.  Musculoskeletal: She exhibits no edema or tenderness.  Neurological: She is alert.  Makes good eye contact with  examiner.  She was able to provide her name and age as well as place.  Follow basic commands Sensation diminished to light touch RUE/RLE DTRs symmetric Motor: LUE/LLE: 4+/5 proximal to distal RUE: 4-5/ proximal to distal RLE:1+/5 proximal to distal  Skin: Skin is warm and dry.  Psychiatric: Her affect is blunt.  Apprehensive    Results for orders placed or performed during the hospital encounter of 12/02/15 (from the past 24 hour(s))  CBC with Differential     Status: Abnormal   Collection Time: 12/02/15  4:38 PM  Result Value Ref Range   WBC 7.0 4.0 - 10.5 K/uL   RBC 4.93 3.87 - 5.11 MIL/uL   Hemoglobin 14.6 12.0 - 15.0 g/dL   HCT 44.3 36.0 - 46.0 %   MCV 89.9 78.0 - 100.0 fL   MCH 29.6 26.0 - 34.0 pg   MCHC 33.0 30.0 - 36.0 g/dL   RDW 18.1 (H) 11.5 - 15.5 %   Platelets 205 150 - 400 K/uL   Neutrophils Relative % 49 %   Neutro Abs 3.5 1.7 - 7.7 K/uL   Lymphocytes Relative 37 %   Lymphs Abs 2.6 0.7 - 4.0 K/uL   Monocytes Relative 11 %   Monocytes Absolute 0.8 0.1 - 1.0 K/uL   Eosinophils Relative 2 %   Eosinophils Absolute 0.1 0.0 - 0.7 K/uL   Basophils Relative 1 %   Basophils Absolute 0.0 0.0 - 0.1 K/uL  Basic metabolic panel     Status: None   Collection Time: 12/02/15  4:38 PM  Result Value Ref Range   Sodium 140 135 - 145 mmol/L   Potassium 3.5 3.5 - 5.1 mmol/L   Chloride 103 101 - 111 mmol/L   CO2 25 22 - 32 mmol/L   Glucose, Bld 85 65 - 99 mg/dL   BUN 7 6 - 20 mg/dL   Creatinine, Ser 0.66 0.44 - 1.00 mg/dL   Calcium 9.7 8.9 - 10.3 mg/dL   GFR calc non Af Amer >60 >60 mL/min   GFR calc Af Amer >60 >60 mL/min   Anion gap 12 5 - 15  I-Stat Troponin, ED (not at [facility])     Status: None   Collection Time: 12/02/15  4:45 PM  Result Value Ref Range   Troponin i, poc 0.01 0.00 - 0.08 ng/mL   Comment 3          Lipid panel     Status: Abnormal   Collection Time: 12/03/15  7:00 AM  Result Value Ref Range   Cholesterol 176 0 - 200 mg/dL   Triglycerides 196 (H) <150  mg/dL   HDL 47 >40 mg/dL   Total CHOL/HDL Ratio 3.7 RATIO   VLDL 39 0 - 40 mg/dL   LDL Cholesterol 90 0 - 99 mg/dL  TSH     Status: None   Collection Time: 12/03/15  7:00 AM  Result Value Ref Range   TSH 1.044 0.350 - 4.500 uIU/mL   Dg Lumbar Spine Complete  Result Date:  12/02/2015 CLINICAL DATA:  Pain following recent fall EXAM: LUMBAR SPINE - COMPLETE 4+ VIEW COMPARISON:  None. FINDINGS: Frontal, lateral, spot lumbosacral lateral, and bilateral oblique views were obtained. There are 5 non-rib-bearing lumbar type vertebral bodies. There is no fracture or spondylolisthesis. Disc spaces appear normal. There is no appreciable facet arthropathy. There is extensive aortoiliac atherosclerotic calcification. IMPRESSION: No fracture or spondylolisthesis. No appreciable disc space narrowing or facet arthropathy. There is extensive aortoiliac atherosclerosis. Electronically Signed   By: Lowella Grip III M.D.   On: 12/02/2015 15:21   Ct Head Wo Contrast  Addendum Date: 12/02/2015   ADDENDUM REPORT: 12/02/2015 17:16 ADDENDUM: Case discussed with clinical service; patient has ~2 months of right leg weakness. Since 08/02/15 subacute to chronic infarct has developed in the distal left ACA territory along the parasagittal left frontal lobe. This infarct is confluent with the patient's more chronic watershed infarct. Electronically Signed   By: Monte Fantasia M.D.   On: 12/02/2015 17:16   Result Date: 12/02/2015 CLINICAL DATA:  Right-sided weakness.  Fall. EXAM: CT HEAD WITHOUT CONTRAST TECHNIQUE: Contiguous axial images were obtained from the base of the skull through the vertex without intravenous contrast. COMPARISON:  08/02/2015 FINDINGS: Brain: Gliosis in the left temporal pole that is stable from prior. There is cortically based gliosis along the vertex of the left cerebral hemisphere, watershed infarct pattern in this patient with chronic left ICA occlusion. Remote left caudate head infarct. No acute  infarct, hemorrhage, hydrocephalus, or mass. Vascular: Asymmetric carotid siphon calcification on the left. Skull: Remote left occipital bone fracture. Remote bilateral craniotomy. Sinuses/Orbits: Negative Other: None IMPRESSION: 1. No acute finding. 2. 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 3. Left temporal pole gliosis, possibly posttraumatic given location and remote left occipital fracture. Electronically Signed: By: Monte Fantasia M.D. On: 12/02/2015 16:55   Mr Jodene Nam Head Wo Contrast  Result Date: 12/02/2015 CLINICAL DATA:  Syncope and right leg weakness. EXAM: MRA HEAD WITHOUT CONTRAST TECHNIQUE: Angiographic images of the Circle of Willis were obtained using MRA technique without intravenous contrast. COMPARISON:  Brain MRI same day, CTA head 08/02/2015 FINDINGS: The examination is markedly degraded by patient motion. Intracranial internal carotid arteries: The right internal carotid artery is grossly normal, though obscured by motion. The left internal carotid artery remains occluded, as demonstrated on prior CTA. Anterior cerebral arteries: Normal. Middle cerebral arteries: The left MCA is diminutive compared to the right. The distal MCA distribution circulation is mildly decreased on the left. Posterior communicating arteries: Not visualized Posterior cerebral arteries: Normal. Basilar artery: Normal. Vertebral arteries: Right dominant Normal. Superior cerebellar arteries: Normal. Anterior inferior cerebellar arteries: Not clearly visualized, which is not uncommon. Posterior inferior cerebellar arteries: Normal on the left. Not clearly visualized on the right. IMPRESSION: 1. Despite efforts by the technologist and patient, motion artifact is present on today's examination and could not be eliminated. This reduces the sensitivity and specificity of the study. 2. Within the above limitation, there is unchanged occlusion of the left internal carotid artery with attenuation of the  left MCA, and slightly decreased left distal MCA distribution circulation. 3. No other identified high-grade stenosis or occlusion. Electronically Signed   By: Ulyses Jarred M.D.   On: 12/02/2015 22:52   Mr Brain Wo Contrast  Result Date: 12/02/2015 CLINICAL DATA:  Right leg weakness EXAM: MRI HEAD WITHOUT CONTRAST TECHNIQUE: Multiplanar, multiecho pulse sequences of the brain and surrounding structures were obtained without intravenous contrast. COMPARISON:  Head CT 12/02/2015, brain  MRI 08/01/2015 FINDINGS: Brain: There is multifocal diffusion restriction within the left frontal and anterior parietal lobes in a watershed distribution. The midline structures are normal. Areas of cystic change in the left watershed distribution and anterior left temporal lobe are unchanged. There is confluent hyperintense T2-weighted signal within the periventricular white matter, most often seen in the setting of chronic microvascular ischemia. No mass lesion or midline shift. No hydrocephalus or extra-axial fluid collection. Vascular: Chronic occlusion of the left internal carotid artery. No evidence of chronic microhemorrhage or amyloid angiopathy. Skull and upper cervical spine: Changes of remote bilateral craniotomies. Sinuses/Orbits: No fluid levels or advanced mucosal thickening. No mastoid effusion. Normal orbits. IMPRESSION: 1. Acute left frontoparietal watershed distribution infarct, superimposed on chronic infarct in this location. No acute hemorrhage or significant mass effect. 2. Findings of multiple remote infarcts and multiple areas of encephalomalacia including the left frontal and parietal lobes and both anterior temporal lobes. 3. Chronic occlusion of the left internal carotid artery. These results will be called to the ordering clinician or representative by the Radiologist Assistant, and communication documented in the PACS or zVision Dashboard. Electronically Signed   By: Ulyses Jarred M.D.   On: 12/02/2015  19:34   Dg Hip Unilat With Pelvis 2-3 Views Right  Result Date: 12/02/2015 CLINICAL DATA:  Right hip pain for 2 months. EXAM: DG HIP (WITH OR WITHOUT PELVIS) 2-3V RIGHT COMPARISON:  Hip radiograph 12/02/2015 FINDINGS: There is IIA right femoral antegrade intra medullary rod with interlocking component in the right femoral neck. Callus formation along the midshaft of the right femur is again noted. There is no acute hardware abnormality identified. The joint space of the right hip is unchanged. No pelvic fracture. IMPRESSION: Unchanged appearance of right femoral intra medullary rod traversing side of prior femoral fracture. Electronically Signed   By: Ulyses Jarred M.D.   On: 12/02/2015 18:57   Dg Hip Unilat W Or Wo Pelvis 2-3 Views Right  Result Date: 12/02/2015 CLINICAL DATA:  Pain with recent falls EXAM: DG HIP (WITH OR WITHOUT PELVIS) 2-3V RIGHT COMPARISON:  July 28, 2015 FINDINGS: Frontal pelvis as well as frontal and lateral right hip images were obtained. There is evidence of previous surgical fixation for fracture of the mid right femur. There is a screw with its tip in the proximal right femoral head. There is evidence of an old healed fracture of the left ischium. No acute fracture or dislocation. Joint spaces appear normal. No erosive change. There is extensive iliac artery atherosclerosis. IMPRESSION: Evidence of old trauma. No acute fracture or dislocation. There is iliac artery atherosclerosis bilaterally. Joint spaces appear intact. Electronically Signed   By: Lowella Grip III M.D.   On: 12/02/2015 15:37   Dg Femur Min 2 Views Right  Result Date: 12/02/2015 CLINICAL DATA:  Pain following recent fall EXAM: RIGHT FEMUR 2 VIEWS COMPARISON:  July 28, 2015 FINDINGS: Frontal and lateral views were obtained. There is screw and rod fixation through a prior fracture of the mid right femur with bony remodeling at the fracture site. No acute fracture or dislocation. There is no knee joint  effusion. There is mild osteoarthritic change in the right knee joint. IMPRESSION: Postoperative change with remodeling. No acute fracture or dislocation. Mild osteoarthritic change right knee joint. No knee joint effusion. Electronically Signed   By: Lowella Grip III M.D.   On: 12/02/2015 15:22    Assessment/Plan: Diagnosis: Acute left brain watershed distribution infarct Labs and images independently reviewed.  Records reviewed  and summated above. Stroke: Continue secondary stroke prophylaxis and Risk Factor Modification listed below:   Antiplatelet therapy:   Blood Pressure Management:  Continue current medication with prn's with permisive HTN per primary team Statin Agent:   Tobacco abuse:   Right sided hemiparesis (LE>UE): fit for orthosis to prevent contractures (PRAFO, etc) Motor recovery: Fluoxetine  1. Does the need for close, 24 hr/day medical supervision in concert with the patient's rehab needs make it unreasonable for this patient to be served in a less intensive setting? Yes  2. Co-Morbidities requiring supervision/potential complications: history of tobacco and marijuana abuse (counsel), CVA July 2017, recent right femur fracture with ORIF, brain tumor resection, seizure disorder (cont meds), CAD with stenting (Cont meds), COPD (monitor RR and O2 Sats with increased activity), anxiety/depression (ensure anxiety and resulting apprehension do not limit functional progress; consider prn medications if warranted) 3. Due to bladder management, safety, disease management and patient education, does the patient require 24 hr/day rehab nursing? Potentially 4. Does the patient require coordinated care of a physician, rehab nurse, PT (1-2 hrs/day, 5 days/week) and OT (1-2 hrs/day, 5 days/week) to address physical and functional deficits in the context of the above medical diagnosis(es)? Yes Addressing deficits in the following areas: balance, endurance, locomotion, strength,  transferring, bathing, dressing, toileting and psychosocial support 5. Can the patient actively participate in an intensive therapy program of at least 3 hrs of therapy per day at least 5 days per week? Not at present 6. The potential for patient to make measurable gains while on inpatient rehab is excellent 7. Anticipated functional outcomes upon discharge from inpatient rehab are min assist and mod assist  with PT, min assist and mod assist with OT, n/a with SLP. 8. Estimated rehab length of stay to reach the above functional goals is: 13-17 days. 9. Does the patient have adequate social supports and living environment to accommodate these discharge functional goals? Yes 10. Anticipated D/C setting: Home 11. Anticipated post D/C treatments: HH therapy and Home excercise program 12. Overall Rehab/Functional Prognosis: good and fair  RECOMMENDATIONS: This patient's condition is appropriate for continued rehabilitative care in the following setting: Pt's functional limitations confounded by right leg pain.  Will await compeltion of medical workup and pt demonstarting ability to tolerate 3 hours therapy/day.  Patient has agreed to participate in recommended program. Yes Note that insurance prior authorization may be required for reimbursement for recommended care.  Comment: Rehab Admissions Coordinator to follow up.  Delice Lesch, MD, Mellody Drown 12/03/2015

## 2015-12-03 NOTE — Care Management Note (Signed)
Case Management Note  Patient Details  Name: Wanda Griffin MRN: [id number] Date of Birth: Dec 03, 1955  Subjective/Objective:  Pt admitted with CVA. She is from home with her spouse.               Action/Plan: Awaiting PT/OT recommendations. CM following for d/c disposition.   Expected Discharge Date:                  Expected Discharge Plan:     In-House Referral:     Discharge planning Services     Post Acute Care Choice:    Choice offered to:     DME Arranged:    DME Agency:     HH Arranged:    HH Agency:     Status of Service:  In process, will continue to follow  If discussed at Long Length of Stay Meetings, dates discussed:    Additional Comments:  Pollie Friar, RN 12/03/2015, 10:37 AM

## 2015-12-03 NOTE — Progress Notes (Signed)
Pt tearful about health circumstances. Initiated smoking cessation education. Pt states no one has ever told her to stop smoking and she was not aware that it was bad for her.  Information given about smoking increasing heart disease, decreasing lung function, and being a risk factor for strokes.  Pt states she will not be told to stop smoking.  Reinforced to patient that once she has had a stroke, she is already at higher risk for another one and continued smoking will increase this risk. Wendee Copp

## 2015-12-03 NOTE — Evaluation (Signed)
Occupational Therapy Evaluation Patient Details Name: Wanda Griffin MRN: [id number] DOB: 08/02/1955 Today's Date: 12/03/2015    History of Present Illness 60 y.o.femalewith history of anxiety, seizures, 07/2015 fall with Rt femur ORIF, tobacco abuse, poor historian, presented to ED with falls, syncopal episode and right leg weakness. Per patient's family, she was progressively getting weaker in the right leg in the last 1 month and in the last 1 week, her right leg "just quit". Patient however did not seek any medical attention. Patient has been falling recently, last fall was 2 days ago. MRI-Acute left frontoparietal watershed distribution infarct, superimposed on chronic infarct in this location. Chronic occlusion of the left internal carotid artery; multiple remote infarcts left frontal and parietal lobes and both anterior temporal lobes   Clinical Impression   Pt reports she was independent with ADL PTA. Currently pt overall max assist for ADL at bed level. Pt declined OOB activity at this time due to pain in R UE/LE. Pt able to assist with scooting up in bed to reposition with use of L UE/LE. Pt presenting with decreased RUE strength/coordination/AROM, blurred vision, pain in RUE/LE impacting her independence and safety with ADL and functional mobility. Recommending CIR level therapies to maximize independence and safety with ADL and functional mobility prior to return home. Pt would benefit from continued skilled OT to address established goals.    Follow Up Recommendations  CIR;Supervision/Assistance - 24 hour    Equipment Recommendations  Other (comment) (TBD at next venue)    Recommendations for Other Services       Precautions / Restrictions Precautions Precautions: Fall;Other (comment) Precaution Comments: pt labile; word-finding problems Restrictions Weight Bearing Restrictions: No      Mobility Bed Mobility Overal bed mobility: Needs Assistance          General bed mobility comments: Pt able to assist with repositioning in bed using LLE and LUE to help boost up in bed.  Transfers                 General transfer comment: Not assessed at this time.    Balance Overall balance assessment: Needs assistance Sitting-balance support: Bilateral upper extremity supported;Feet unsupported Sitting balance-Leahy Scale: Poor Sitting balance - Comments: posterior lean                                    ADL Overall ADL's : Needs assistance/impaired Eating/Feeding: Set up;Sitting Eating/Feeding Details (indicate cue type and reason): Pt reports she was able to self feed lunch with L hand but did have difficulty. Grooming: Moderate assistance;Bed level   Upper Body Bathing: Moderate assistance;Bed level   Lower Body Bathing: Maximal assistance;Bed level   Upper Body Dressing : Moderate assistance;Bed level   Lower Body Dressing: Maximal assistance;Bed level Lower Body Dressing Details (indicate cue type and reason): Pt able to reach L foot at bed level               General ADL Comments: Pt declining to perform OOB activity at this time due to pain in R UE/LE currently and when working with PT earlier today.     Vision Additional Comments: Difficult to assess due to impaired communication. Needs to be futher assessed with functional activities. Pt does not have glassess present in room at this time.    Perception     Praxis      Pertinent Vitals/Pain Pain Assessment: 0-10 Pain  Score: 7  Faces Pain Scale: Hurts worst Pain Location: R UE/LE Pain Descriptors / Indicators: Aching;Cramping;Tightness Pain Intervention(s): Limited activity within patient's tolerance;Monitored during session     Hand Dominance Right   Extremity/Trunk Assessment Upper Extremity Assessment Upper Extremity Assessment: RUE deficits/detail RUE Deficits / Details: Shoulder PROM WFL but painful. Strength grossly 3+/5 at shoulder and  elbow. Grip 4/5. Pt reports dulled sensation on R side. RUE Sensation: decreased light touch RUE Coordination: decreased fine motor;decreased gross motor   Lower Extremity Assessment Lower Extremity Assessment: Defer to PT evaluation    Cervical / Trunk Assessment    Communication Communication Communication: Expressive difficulties   Cognition Arousal/Alertness: Awake/alert Behavior During Therapy: WFL for tasks assessed/performed Overall Cognitive Status: Difficult to assess                     General Comments       Exercises       Shoulder Instructions      Home Living Family/patient expects to be discharged to:: Private residence Living Arrangements: Spouse/significant other Available Help at Discharge: Family;Available 24 hours/day Type of Home: House Home Access: Ramped entrance     Home Layout: One level     Bathroom Shower/Tub: Occupational psychologist: Standard     Home Equipment: Environmental consultant - 2 wheels   Additional Comments: per pt; ?complete/accurate due to word-finding issues      Prior Functioning/Environment Level of Independence: Independent with assistive device(s)        Comments: Reports she uses a RW on OT eval but independent with ADL. Does not do much cooking/cleaning anymore.        OT Problem List: Decreased strength;Decreased range of motion;Decreased activity tolerance;Impaired vision/perception;Decreased coordination;Decreased knowledge of use of DME or AE;Impaired sensation;Impaired tone;Impaired UE functional use;Pain   OT Treatment/Interventions: Self-care/ADL training;Therapeutic exercise;Neuromuscular education;Energy conservation;DME and/or AE instruction;Therapeutic activities;Patient/family education;Balance training;Visual/perceptual remediation/compensation    OT Goals(Current goals can be found in the care plan section) Acute Rehab OT Goals Patient Stated Goal: did not state OT Goal Formulation: With  patient Time For Goal Achievement: 12/17/15 Potential to Achieve Goals: Good ADL Goals Pt Will Perform Grooming: with min assist;sitting Pt Will Perform Upper Body Bathing: with min assist;sitting Pt Will Perform Lower Body Bathing: with min assist;sit to/from stand Pt Will Transfer to Toilet: with min assist;stand pivot transfer;bedside commode Pt Will Perform Toileting - Clothing Manipulation and hygiene: with min assist;sit to/from stand  OT Frequency: Min 2X/week   Barriers to D/C:            Co-evaluation              End of Session Nurse Communication: Mobility status  Activity Tolerance: Patient limited by pain Patient left: in bed;with call bell/phone within reach;with bed alarm set   Time: [id number] OT Time Calculation (min): 15 min Charges:  OT General Charges $OT Visit: 1 Procedure OT Evaluation $OT Eval Moderate Complexity: 1 Procedure G-Codes: OT G-codes **NOT FOR INPATIENT CLASS** Functional Assessment Tool Used: Clinical judgement Functional Limitation: Self care Self Care Current Status [id number]): At least 60 percent but less than 80 percent impaired, limited or restricted Self Care Goal Status [id number]): At least 40 percent but less than 60 percent impaired, limited or restricted   Binnie Kand M.S., OTR/L Pager: [phone]  12/03/2015, 2:11 PM

## 2015-12-03 NOTE — Progress Notes (Signed)
PROGRESS NOTE    Wanda Griffin  [id number] DOB: November 08, 1955 DOA: 12/02/2015 PCP: [name], MD  Brief Narrative: 60 year old female with history of anxiety, seizures, tobacco abuse, poor historian, presented to ED with falls, syncopal episode and right leg weakness. patient reported that she was going to the bathroom 10/30 when she had a syncopal episode , Per patient's family, they had noticed that she was progressively getting weaker in the right leg in the last 1 month and in the last 1 week, her right leg "just quit". Patient however did not seek any medical attention. Patient has been falling recently, last fall was 2-3 days ago MRI positive for acute on chronic CVA  Assessment & Plan:   CVA (cerebral vascular accident) ([place]) -MRI: Acute left frontoparietal watershed distribution infarct,superimposed on chronic infarct  -evidence of multiple remote CVAs too -had CVA in July of this year and started Plavix then -chronically occluded L ICA noted on MRA -continue ASA -FU LDL and Hba1c -Pt/OT/SLP consulting -neurology consulted   Smoker/tobacco use -counseled    Syncope and collapse - monitor on tele, FU ECHO - no history suggestive of seizure    COPD (chronic obstructive pulmonary disease) ([place]) - Stable continue dulera     Malnutrition of moderate degree - Patient reports poor appetite and losing weight, appears to be deconditioned  - Nutrition consulted   anxiety/depression Continue Seroquel, Xanax  History of seizures Continue Depakote  DVT prophylaxis:  Lovenox   CODE STATUS:  Full CODE STATUS Family Communication:None at bedside Disposition Plan:will need rehab   Consultants:  Neurology  Subjective: R side remains weak  Objective: Vitals:   12/03/15 0600 12/03/15 0800 12/03/15 1018 12/03/15 1033  BP: 132/60 117/71  125/71  Pulse: 77 90  78  Resp:    16  Temp:  97.8 F (36.6 C)  99.1 F (37.3 C)  TempSrc:  Oral  Oral  SpO2:  95%  92% 90%  Weight:      Height:        Intake/Output Summary (Last 24 hours) at 12/03/15 1305 Last data filed at 12/03/15 0400  Gross per 24 hour  Intake              470 ml  Output              100 ml  Net              370 ml   Filed Weights   12/02/15 1256 12/02/15 2024  Weight: 43.1 kg (95 lb) 41 kg (90 lb 6.4 oz)    Examination:  General exam: Appears calm and comfortable, flat affect, no distress Respiratory system: Clear to auscultation. Respiratory effort normal. Cardiovascular system: S1 & S2 heard, RRR. No JVD, murmurs, rubs, gallops or clicks. No pedal edema. Gastrointestinal system: Abdomen is nondistended, soft and nontender. No organomegaly or masses felt. Normal bowel sounds heard. Central nervous system: Alert and oriented. No focal neurological deficits. Extremities: R side almost Flaccid R leg Skin: No rashes, lesions or ulcers Psychiatry: Judgement and insight appear normal. Flat affect.     Data Reviewed: I have personally reviewed following labs and imaging studies  CBC:  Recent Labs Lab 12/02/15 1638  WBC 7.0  NEUTROABS 3.5  HGB 14.6  HCT 44.3  MCV 89.9  PLT [id number]   Basic Metabolic Panel:  Recent Labs Lab 12/02/15 1638  NA 140  K 3.5  CL 103  CO2 25  GLUCOSE 85  BUN 7  CREATININE 0.66  CALCIUM 9.7   GFR: Estimated Creatinine Clearance: 49 mL/min (by C-G formula based on SCr of 0.66 mg/dL). Liver Function Tests: No results for input(s): AST, ALT, ALKPHOS, BILITOT, PROT, ALBUMIN in the last 168 hours. No results for input(s): LIPASE, AMYLASE in the last 168 hours. No results for input(s): AMMONIA in the last 168 hours. Coagulation Profile: No results for input(s): INR, PROTIME in the last 168 hours. Cardiac Enzymes: No results for input(s): CKTOTAL, CKMB, CKMBINDEX, TROPONINI in the last 168 hours. BNP (last 3 results) No results for input(s): PROBNP in the last 8760 hours. HbA1C: No results for input(s): HGBA1C in the last 72  hours. CBG: No results for input(s): GLUCAP in the last 168 hours. Lipid Profile:  Recent Labs  12/03/15 0700  CHOL 176  HDL 47  LDLCALC 90  TRIG 196*  CHOLHDL 3.7   Thyroid Function Tests:  Recent Labs  12/03/15 0700  TSH 1.044   Anemia Panel: No results for input(s): VITAMINB12, FOLATE, FERRITIN, TIBC, IRON, RETICCTPCT in the last 72 hours. Urine analysis:    Component Value Date/Time   COLORURINE YELLOW 07/28/2015 1027   APPEARANCEUR CLEAR 07/28/2015 1027   LABSPEC 1.011 07/28/2015 1027   PHURINE 6.0 07/28/2015 1027   GLUCOSEU NEGATIVE 07/28/2015 1027   HGBUR TRACE (A) 07/28/2015 1027   BILIRUBINUR NEGATIVE 07/28/2015 1027   KETONESUR NEGATIVE 07/28/2015 1027   PROTEINUR NEGATIVE 07/28/2015 1027   UROBILINOGEN 1.0 05/23/2012 1359   NITRITE NEGATIVE 07/28/2015 1027   LEUKOCYTESUR MODERATE (A) 07/28/2015 1027   Sepsis Labs: @LABRCNTIP (procalcitonin:4,lacticidven:4)  )No results found for this or any previous visit (from the past 240 hour(s)).       Radiology Studies: Dg Lumbar Spine Complete  Result Date: 12/02/2015 CLINICAL DATA:  Pain following recent fall EXAM: LUMBAR SPINE - COMPLETE 4+ VIEW COMPARISON:  None. FINDINGS: Frontal, lateral, spot lumbosacral lateral, and bilateral oblique views were obtained. There are 5 non-rib-bearing lumbar type vertebral bodies. There is no fracture or spondylolisthesis. Disc spaces appear normal. There is no appreciable facet arthropathy. There is extensive aortoiliac atherosclerotic calcification. IMPRESSION: No fracture or spondylolisthesis. No appreciable disc space narrowing or facet arthropathy. There is extensive aortoiliac atherosclerosis. Electronically Signed   By: Lowella Grip III M.D.   On: 12/02/2015 15:21   Ct Head Wo Contrast  Addendum Date: 12/02/2015   ADDENDUM REPORT: 12/02/2015 17:16 ADDENDUM: Case discussed with clinical service; patient has ~2 months of right leg weakness. Since 08/02/15 subacute  to chronic infarct has developed in the distal left ACA territory along the parasagittal left frontal lobe. This infarct is confluent with the patient's more chronic watershed infarct. Electronically Signed   By: Monte Fantasia M.D.   On: 12/02/2015 17:16   Result Date: 12/02/2015 CLINICAL DATA:  Right-sided weakness.  Fall. EXAM: CT HEAD WITHOUT CONTRAST TECHNIQUE: Contiguous axial images were obtained from the base of the skull through the vertex without intravenous contrast. COMPARISON:  08/02/2015 FINDINGS: Brain: Gliosis in the left temporal pole that is stable from prior. There is cortically based gliosis along the vertex of the left cerebral hemisphere, watershed infarct pattern in this patient with chronic left ICA occlusion. Remote left caudate head infarct. No acute infarct, hemorrhage, hydrocephalus, or mass. Vascular: Asymmetric carotid siphon calcification on the left. Skull: Remote left occipital bone fracture. Remote bilateral craniotomy. Sinuses/Orbits: Negative Other: None IMPRESSION: 1. No acute finding. 2. 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 3. Left temporal pole gliosis, possibly posttraumatic given location  and remote left occipital fracture. Electronically Signed: By: Monte Fantasia M.D. On: 12/02/2015 16:55   Mr Jodene Nam Head Wo Contrast  Result Date: 12/02/2015 CLINICAL DATA:  Syncope and right leg weakness. EXAM: MRA HEAD WITHOUT CONTRAST TECHNIQUE: Angiographic images of the Circle of Willis were obtained using MRA technique without intravenous contrast. COMPARISON:  Brain MRI same day, CTA head 08/02/2015 FINDINGS: The examination is markedly degraded by patient motion. Intracranial internal carotid arteries: The right internal carotid artery is grossly normal, though obscured by motion. The left internal carotid artery remains occluded, as demonstrated on prior CTA. Anterior cerebral arteries: Normal. Middle cerebral arteries: The left MCA is diminutive  compared to the right. The distal MCA distribution circulation is mildly decreased on the left. Posterior communicating arteries: Not visualized Posterior cerebral arteries: Normal. Basilar artery: Normal. Vertebral arteries: Right dominant Normal. Superior cerebellar arteries: Normal. Anterior inferior cerebellar arteries: Not clearly visualized, which is not uncommon. Posterior inferior cerebellar arteries: Normal on the left. Not clearly visualized on the right. IMPRESSION: 1. Despite efforts by the technologist and patient, motion artifact is present on today's examination and could not be eliminated. This reduces the sensitivity and specificity of the study. 2. Within the above limitation, there is unchanged occlusion of the left internal carotid artery with attenuation of the left MCA, and slightly decreased left distal MCA distribution circulation. 3. No other identified high-grade stenosis or occlusion. Electronically Signed   By: Ulyses Jarred M.D.   On: 12/02/2015 22:52   Mr Brain Wo Contrast  Result Date: 12/02/2015 CLINICAL DATA:  Right leg weakness EXAM: MRI HEAD WITHOUT CONTRAST TECHNIQUE: Multiplanar, multiecho pulse sequences of the brain and surrounding structures were obtained without intravenous contrast. COMPARISON:  Head CT 12/02/2015, brain MRI 08/01/2015 FINDINGS: Brain: There is multifocal diffusion restriction within the left frontal and anterior parietal lobes in a watershed distribution. The midline structures are normal. Areas of cystic change in the left watershed distribution and anterior left temporal lobe are unchanged. There is confluent hyperintense T2-weighted signal within the periventricular white matter, most often seen in the setting of chronic microvascular ischemia. No mass lesion or midline shift. No hydrocephalus or extra-axial fluid collection. Vascular: Chronic occlusion of the left internal carotid artery. No evidence of chronic microhemorrhage or amyloid  angiopathy. Skull and upper cervical spine: Changes of remote bilateral craniotomies. Sinuses/Orbits: No fluid levels or advanced mucosal thickening. No mastoid effusion. Normal orbits. IMPRESSION: 1. Acute left frontoparietal watershed distribution infarct, superimposed on chronic infarct in this location. No acute hemorrhage or significant mass effect. 2. Findings of multiple remote infarcts and multiple areas of encephalomalacia including the left frontal and parietal lobes and both anterior temporal lobes. 3. Chronic occlusion of the left internal carotid artery. These results will be called to the ordering clinician or representative by the Radiologist Assistant, and communication documented in the PACS or zVision Dashboard. Electronically Signed   By: Ulyses Jarred M.D.   On: 12/02/2015 19:34   Dg Hip Unilat With Pelvis 2-3 Views Right  Result Date: 12/02/2015 CLINICAL DATA:  Right hip pain for 2 months. EXAM: DG HIP (WITH OR WITHOUT PELVIS) 2-3V RIGHT COMPARISON:  Hip radiograph 12/02/2015 FINDINGS: There is IIA right femoral antegrade intra medullary rod with interlocking component in the right femoral neck. Callus formation along the midshaft of the right femur is again noted. There is no acute hardware abnormality identified. The joint space of the right hip is unchanged. No pelvic fracture. IMPRESSION: Unchanged appearance of right femoral intra  medullary rod traversing side of prior femoral fracture. Electronically Signed   By: Ulyses Jarred M.D.   On: 12/02/2015 18:57   Dg Hip Unilat W Or Wo Pelvis 2-3 Views Right  Result Date: 12/02/2015 CLINICAL DATA:  Pain with recent falls EXAM: DG HIP (WITH OR WITHOUT PELVIS) 2-3V RIGHT COMPARISON:  July 28, 2015 FINDINGS: Frontal pelvis as well as frontal and lateral right hip images were obtained. There is evidence of previous surgical fixation for fracture of the mid right femur. There is a screw with its tip in the proximal right femoral head. There  is evidence of an old healed fracture of the left ischium. No acute fracture or dislocation. Joint spaces appear normal. No erosive change. There is extensive iliac artery atherosclerosis. IMPRESSION: Evidence of old trauma. No acute fracture or dislocation. There is iliac artery atherosclerosis bilaterally. Joint spaces appear intact. Electronically Signed   By: Lowella Grip III M.D.   On: 12/02/2015 15:37   Dg Femur Min 2 Views Right  Result Date: 12/02/2015 CLINICAL DATA:  Pain following recent fall EXAM: RIGHT FEMUR 2 VIEWS COMPARISON:  July 28, 2015 FINDINGS: Frontal and lateral views were obtained. There is screw and rod fixation through a prior fracture of the mid right femur with bony remodeling at the fracture site. No acute fracture or dislocation. There is no knee joint effusion. There is mild osteoarthritic change in the right knee joint. IMPRESSION: Postoperative change with remodeling. No acute fracture or dislocation. Mild osteoarthritic change right knee joint. No knee joint effusion. Electronically Signed   By: Lowella Grip III M.D.   On: 12/02/2015 15:22        Scheduled Meds: . clopidogrel  75 mg Oral Daily  . divalproex  500 mg Oral TID  . enoxaparin (LOVENOX) injection  30 mg Subcutaneous QHS  . mometasone-formoterol  2 puff Inhalation BID  . QUEtiapine  25 mg Oral QHS   Continuous Infusions: . sodium chloride 75 mL/hr at 12/02/15 2144     LOS: 0 days    Time spent: 74min    Domenic Polite, MD [facility] Pager [phone]  If 7PM-7AM, please contact night-coverage [email or web address] Password [email or web address] 12/03/2015, 1:05 PM

## 2015-12-04 ENCOUNTER — Inpatient Hospital Stay (HOSPITAL_COMMUNITY): Payer: Medicare Other

## 2015-12-04 DIAGNOSIS — I63032 Cerebral infarction due to thrombosis of left carotid artery: Secondary | ICD-10-CM

## 2015-12-04 DIAGNOSIS — R55 Syncope and collapse: Secondary | ICD-10-CM

## 2015-12-04 LAB — VAS US CAROTID
LCCADDIAS: -9 cm/s
LEFT ECA DIAS: 22 cm/s
LEFT VERTEBRAL DIAS: 20 cm/s
Left CCA dist sys: -59 cm/s
Left CCA prox dias: 18 cm/s
Left CCA prox sys: 203 cm/s
RIGHT ECA DIAS: -14 cm/s
RIGHT VERTEBRAL DIAS: 21 cm/s
Right CCA prox dias: 28 cm/s
Right CCA prox sys: 97 cm/s
Right cca dist sys: -114 cm/s

## 2015-12-04 LAB — ECHOCARDIOGRAM COMPLETE
HEIGHTINCHES: 62 in
WEIGHTICAEL: 1446.4 [oz_av]

## 2015-12-04 LAB — HEMOGLOBIN A1C
HEMOGLOBIN A1C: 5.7 % — AB (ref 4.8–5.6)
MEAN PLASMA GLUCOSE: 117 mg/dL

## 2015-12-04 NOTE — Clinical Social Work Placement (Signed)
   CLINICAL SOCIAL WORK PLACEMENT  NOTE  Date:  12/04/2015  Patient Details  Name: KARALYNN HASSE MRN: [id number] Date of Birth: [birth year]  Clinical Social Work is seeking post-discharge placement for this patient at the [place] level of care (*CSW will initial, date and re-position this form in  chart as items are completed):  Yes   Patient/family provided with [facility] Work Department's list of facilities offering this level of care within the geographic area requested by the patient (or if unable, by the patient's family).  Yes   Patient/family informed of their freedom to choose among providers that offer the needed level of care, that participate in Medicare, Medicaid or managed care program needed by the patient, have an available bed and are willing to accept the patient.  Yes   Patient/family informed of [facility]'s ownership interest in [facility] and [facility], as well as of the fact that they are under no obligation to receive care at these facilities.  PASRR submitted to EDS on       PASRR number received on       Existing PASRR number confirmed on 12/04/15     FL2 transmitted to all facilities in geographic area requested by pt/family on 12/04/15     FL2 transmitted to all facilities within larger geographic area on       Patient informed that his/her managed care company has contracts with or will negotiate with certain facilities, including the following:            Patient/family informed of bed offers received.  Patient chooses bed at       Physician recommends and patient chooses bed at      Patient to be transferred to   on  .  Patient to be transferred to facility by       Patient family notified on   of transfer.  Name of family member notified:        PHYSICIAN       Additional Comment:    _______________________________________________ Darden Dates, LCSW 12/04/2015, 2:24 PM

## 2015-12-04 NOTE — Progress Notes (Signed)
Physical Therapy Treatment Patient Details Name: Wanda Griffin MRN: [id number] DOB: 1955-04-23 Today's Date: 12/04/2015    History of Present Illness 60 y.o.femalewith history of anxiety, seizures, 07/2015 fall with Rt femur ORIF, tobacco abuse, poor historian, presented to ED with falls, syncopal episode and right leg weakness. Per patient's family, she was progressively getting weaker in the right leg in the last 1 month and in the last 1 week, her right leg "just quit". Patient however did not seek any medical attention. Patient has been falling recently, last fall was 2 days ago. MRI-Acute left frontoparietal watershed distribution infarct, superimposed on chronic infarct in this location. Chronic occlusion of the left internal carotid artery; multiple remote infarcts left frontal and parietal lobes and both anterior temporal lobes    PT Comments    Pt performed increased mobility and advanced to OOB transfer training and brief sidesteps along bed.  Pt will continue to require rehab in a post acute setting to improve mobility before returning home.    Follow Up Recommendations  CIR     Equipment Recommendations   (TBA at next venue.  )    Recommendations for Other Services       Precautions / Restrictions Precautions Precautions: Fall;Other (comment) Precaution Comments: R knee buckles in stance phase.   Restrictions Weight Bearing Restrictions: No    Mobility  Bed Mobility Overal bed mobility: Needs Assistance Bed Mobility: Supine to Sit;Sit to Supine     Supine to sit: Mod assist Sit to supine: Min assist;+2 for physical assistance   General bed mobility comments: Assist for RLE on and off of bed and to scoot hips out to EOB.   Transfers Overall transfer level: Needs assistance Equipment used: Rolling walker (2 wheeled) Transfers: Sit to/from Stand Sit to Stand: Min assist;+2 physical assistance         General transfer comment: Pt able to boost up from EOB  with light min assist +2. Pt very painful in RLE with weight bearing. PTA assisted patient in R knee extension and positioning R LE.  Pt required blocking of R knee to take 4 shuffle steps to head of bed.    Ambulation/Gait Ambulation/Gait assistance: Mod assist;+2 safety/equipment;+2 physical assistance Ambulation Distance (Feet): 4 Feet Assistive device: Rolling walker (2 wheeled) Gait Pattern/deviations: Step-to pattern (sidestepping.  )   Gait velocity interpretation: Below normal speed for age/gender General Gait Details: R knee buckles in stance phase.  Pt required assist to block R knee.  Pt refused further gait training.  Pt became tearful.     Stairs            Wheelchair Mobility    Modified Rankin (Stroke Patients Only)       Balance Overall balance assessment: Needs assistance Sitting-balance support: Feet supported;Single extremity supported Sitting balance-Leahy Scale: Fair     Standing balance support: Bilateral upper extremity supported Standing balance-Leahy Scale: Poor Standing balance comment: RW and external support for standing balance                    Cognition Arousal/Alertness: Awake/alert Behavior During Therapy: WFL for tasks assessed/performed (tearful/laughable) Overall Cognitive Status: Difficult to assess                      Exercises Other Exercises Other Exercises: AAROM RUE x5 at elbow, shoulder, hand.    General Comments        Pertinent Vitals/Pain Pain Assessment: Faces Faces Pain Scale: Hurts whole  lot Pain Location: R LE Pain Descriptors / Indicators: Moaning;Crying;Guarding;Grimacing Pain Intervention(s): Monitored during session;Repositioned    Home Living                      Prior Function            PT Goals (current goals can now be found in the care plan section) Acute Rehab PT Goals Patient Stated Goal: get back to bed Potential to Achieve Goals: Fair Progress towards PT goals:  Progressing toward goals    Frequency    Min 4X/week      PT Plan Current plan remains appropriate    Co-evaluation PT/OT/SLP Co-Evaluation/Treatment: Yes Reason for Co-Treatment: Complexity of the patient's impairments (multi-system involvement) PT goals addressed during session: Mobility/safety with mobility;Balance OT goals addressed during session: ADL's and self-care;Proper use of Adaptive equipment and DME     End of Session Equipment Utilized During Treatment: Gait belt Activity Tolerance: Patient limited by pain Patient left: in bed;with call bell/phone within reach;with bed alarm set     Time: [id number] PT Time Calculation (min) (ACUTE ONLY): 24 min  Charges:  $Therapeutic Activity: 8-22 mins                    G Codes:      Cristela Blue December 19, 2015, 5:22 PM  Governor Rooks, PTA pager [phone]

## 2015-12-04 NOTE — Clinical Social Work Note (Signed)
Clinical Social Work Assessment  Patient Details  Name: Wanda Griffin MRN: [id number] Date of Birth: October 10, 1955  Date of referral:  12/04/15               Reason for consult:  Facility Placement, Discharge Planning                Permission sought to share information with:  [place] granted to share information::  Yes, Verbal Permission Granted  Name::     Wanda Griffin  Relationship::  guardian  Contact Information:  [phone]  Housing/Transportation Living arrangements for the past 2 months:  Mobile Home Source of Information:  Guardian Patient Interpreter Needed:  None Criminal Activity/Legal Involvement Pertinent to Current Situation/Hospitalization:  No - Comment as needed Significant Relationships:  Parents, Other Family Members, Spouse Lives with:  Spouse Do you feel safe going back to the place where you live?  No (Pt needs a high level of care) Need for family participation in patient care:  Yes (Comment) (Guardian)  Care giving concerns:  No care giving concerns identified.   Social Worker assessment / plan:  CSW spoke with pt's guardian, Wanda Griffin with [facility]. CSW introduced herself and explained role of social work. PT is recommending CIR, however pt has been declined and is need of SNF placement. Pt is alert and oriented per chart review, however pt has a TBI. Pt lives independently with her spouse, who is also cognitively impaired. CSW explained the need for a 3 night qualifying stay and the potential of having a limited number of Medicare days left. With guardian's permission, CSW left a message for admissions coordinator at [facility] (her previous facility), to verify Medicare days used. Pt does have Medicaid. CSW initiated SNF search for [facility]. CSW will follow up with bed offers. CSW will continue to follow.   Employment status:  Disabled (Comment on whether or not currently receiving Disability) Insurance  information:  Medicare PT Recommendations:  [place] / Referral to community resources:  [place]  Patient/Family's Response to care:  Pt's guardian was appreciative of CSW support.   Patient/Family's Understanding of and Emotional Response to Diagnosis, Current Treatment, and Prognosis:  Pt's guardian understands that pt is in need of STR in order to return home safely.   Emotional Assessment Appearance:  Other (Comment Required Attitude/Demeanor/Rapport:  Other Affect (typically observed):  Unable to Assess Orientation:  Oriented to Self, Oriented to Place, Oriented to  Time, Oriented to Situation Alcohol / Substance use:  Not Applicable Psych involvement (Current and /or in the community):  No (Comment)  Discharge Needs  Concerns to be addressed:  Adjustment to Illness, Decision making concerns Readmission within the last 30 days:  No Current discharge risk:  Cognitively Impaired Barriers to Discharge:  Continued Medical Work up   [employer], LCSW 12/04/2015, 2:25 PM

## 2015-12-04 NOTE — Progress Notes (Signed)
Occupational Therapy Treatment Patient Details Name: Wanda Griffin MRN: [id number] DOB: [birth year] Today's Date: 12/04/2015    History of present illness 60 y.o.femalewith history of anxiety, seizures, 07/2015 fall with Rt femur ORIF, tobacco abuse, poor historian, presented to ED with falls, syncopal episode and right leg weakness. Per patient's family, she was progressively getting weaker in the right leg in the last 1 month and in the last 1 week, her right leg "just quit". Patient however did not seek any medical attention. Patient has been falling recently, last fall was 2 days ago. MRI-Acute left frontoparietal watershed distribution infarct, superimposed on chronic infarct in this location. Chronic occlusion of the left internal carotid artery; multiple remote infarcts left frontal and parietal lobes and both anterior temporal lobes   OT comments  Pt tolerated sitting EOB and light OOB activity today despite pain in RLE. Pt able to perform grooming task sitting EOB with min assist, sit to stand with min assist +2, and small side steps to [facility] for repositioning with physical assist to guide RLE. Pt tolerated gentle AAROM to RUE x5. Per Rehab Admission Coordinator Pamala Hurry) note; pt and family have chosen to d/c to SNF closer to home. Updated d/c plan to SNF for follow up. Will continue to follow acutely.   Follow Up Recommendations  SNF;Supervision/Assistance - 24 hour    Equipment Recommendations  Other (comment) (TBD at next venue)    Recommendations for Other Services      Precautions / Restrictions Precautions Precautions: Fall;Other (comment) Precaution Comments: pt labile; word-finding problems Restrictions Weight Bearing Restrictions: No       Mobility Bed Mobility Overal bed mobility: Needs Assistance Bed Mobility: Supine to Sit;Sit to Supine     Supine to sit: Mod assist Sit to supine: Min assist   General bed mobility comments: Assist for RLE on and off of bed  and to scoot hips out to EOB.   Transfers Overall transfer level: Needs assistance Equipment used: Rolling walker (2 wheeled) Transfers: Sit to/from Stand Sit to Stand: Min assist;+2 physical assistance         General transfer comment: Pt able to boost up from EOB with light min assist +2. Pt very painful in RLE with weight bearing. Pt able to take a few small side steps to [facility] for repositioning. Physical assist required to advance RLE.    Balance Overall balance assessment: Needs assistance Sitting-balance support: Feet supported;Single extremity supported Sitting balance-Leahy Scale: Fair     Standing balance support: Bilateral upper extremity supported Standing balance-Leahy Scale: Poor Standing balance comment: RW and external support for standing balance                   ADL Overall ADL's : Needs assistance/impaired     Grooming: Minimal assistance;Sitting;Brushing hair Grooming Details (indicate cue type and reason): Min assist due to knots in hair; pt able to brush with use of LUE.                             Functional mobility during ADLs: Minimal assistance;+2 for physical assistance;Rolling walker (for sit to stand only) General ADL Comments: Pt tolerated sitting EOB ~5 minutes during grooming task with supervision. Pt with good sitting balance today. Pt continues to be tearful and painful in RLE.      Vision                     Perception  Praxis      Cognition   Behavior During Therapy: WFL for tasks assessed/performed (tearful/labile) Overall Cognitive Status: Difficult to assess                       Extremity/Trunk Assessment               Exercises Other Exercises Other Exercises: AAROM RUE x5 at elbow, shoulder, hand.   Shoulder Instructions       General Comments      Pertinent Vitals/ Pain       Pain Assessment: Faces Faces Pain Scale: Hurts whole lot Pain Location: R thigh Pain  Descriptors / Indicators: Moaning;Crying;Grimacing;Guarding Pain Intervention(s): Monitored during session;Limited activity within patient's tolerance  Home Living                                          Prior Functioning/Environment              Frequency  Min 2X/week        Progress Toward Goals  OT Goals(current goals can now be found in the care plan section)  Progress towards OT goals: Progressing toward goals  Acute Rehab OT Goals Patient Stated Goal: get back to bed OT Goal Formulation: With patient  Plan Discharge plan needs to be updated    Co-evaluation    PT/OT/SLP Co-Evaluation/Treatment: Yes Reason for Co-Treatment: Complexity of the patient's impairments (multi-system involvement)   OT goals addressed during session: ADL's and self-care      End of Session Equipment Utilized During Treatment: Rolling walker;Gait belt   Activity Tolerance Patient tolerated treatment well   Patient Left in bed;with call bell/phone within reach;with bed alarm set   Nurse Communication          Time: [id number] OT Time Calculation (min): 23 min  Charges: OT General Charges $OT Visit: 1 Procedure OT Treatments $Self Care/Home Management : 8-22 mins  Binnie Kand M.S., OTR/L Pager: [phone]  12/04/2015, 4:41 PM

## 2015-12-04 NOTE — Progress Notes (Signed)
PROGRESS NOTE    Wanda Griffin  [id number] DOB: Oct 29, 1955 DOA: 12/02/2015 PCP: [name], MD  Brief Narrative: 60 year old female with history of anxiety, seizures, tobacco abuse, poor historian, presented to ED with falls, syncopal episode and right leg weakness. patient reported that she was going to the bathroom 10/30 when she had a syncopal episode , Per patient's family, they had noticed that she was progressively getting weaker in the right leg in the last 1 month and in the last 1 week, her right leg "just quit". Patient however did not seek any medical attention. 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 -continue Plavix, this was started in June after last CVA -LDL-90 and Hba1c 5.7 -Pt/OT/SLP consulting, CIR consulted -neurology consulting -ECHO pending  R thigh/Hip pain -H/o ORIF in June 2017 -Xray unremarkable except for post op changes -No pain with passive RoM of R hip  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 -has a guardian at [employer] use -counseled    Syncope and collapse - no events on tele, FU ECHO - no history suggestive of seizure    COPD (chronic obstructive pulmonary disease) ([place]) - Stable continue dulera     Malnutrition of moderate degree - Patient reports poor appetite and losing weight, appears to be deconditioned  - Nutrition consulted   anxiety/depression Continue Seroquel, Xanax  History of seizures Continue Depakote  DVT prophylaxis:  Lovenox   CODE STATUS:  Full CODE STATUS Family Communication:None at bedside Disposition Plan:needs SNF   Consultants:   Neurology  Subjective: R side remains weak, has morning cough  Objective: Vitals:   12/04/15 0053 12/04/15 0537 12/04/15 0820 12/04/15 1033  BP: (!) 147/56 (!) 149/60  (!) 147/76  Pulse: 69 70  78  Resp: 16 16  20   Temp: 98.1 F (36.7 C) 98.1 F (36.7 C)  98.1 F (36.7 C)  TempSrc: Oral Oral  Oral  SpO2: 98% 97% 93% 94%  Weight:      Height:       No intake or output data in the 24 hours ending 12/04/15 1155 Filed Weights   12/02/15 1256 12/02/15 2024  Weight: 43.1 kg (95 lb) 41 kg (90 lb 6.4 oz)    Examination:  General exam: Appears calm and comfortable, tearful, anxious, no distress Respiratory system: Clear to auscultation. Respiratory effort normal. Cardiovascular system: S1 & S2 heard, RRR. No JVD, murmurs, rubs, gallops or clicks. No pedal edema. Gastrointestinal system: Abdomen is nondistended, soft and nontender. No organomegaly or masses felt. Normal bowel sounds heard. Central nervous system: Alert and oriented. No focal neurological deficits. Extremities: R side almost Flaccid R leg Skin: No rashes, lesions or ulcers Psychiatry: Judgement and insight appear normal. Flat affect.     Data Reviewed: I have personally reviewed following labs and imaging studies  CBC:  Recent Labs Lab 12/02/15 1638  WBC 7.0  NEUTROABS 3.5  HGB 14.6  HCT 44.3  MCV 89.9  PLT 99991111   Basic Metabolic Panel:  Recent Labs Lab 12/02/15 1638  NA 140  K 3.5  CL 103  CO2 25  GLUCOSE 85  BUN 7  CREATININE 0.66  CALCIUM 9.7  GFR: Estimated Creatinine Clearance: 49 mL/min (by C-G formula based on SCr of 0.66 mg/dL). Liver Function Tests: No results for input(s): AST, ALT, ALKPHOS, BILITOT, PROT, ALBUMIN in the last 168 hours. No results for input(s): LIPASE, AMYLASE in the last 168 hours. No results for input(s): AMMONIA in the last 168 hours. Coagulation Profile: No results for input(s): INR, PROTIME in the last 168 hours. Cardiac Enzymes: No results for  input(s): CKTOTAL, CKMB, CKMBINDEX, TROPONINI in the last 168 hours. BNP (last 3 results) No results for input(s): PROBNP in the last 8760 hours. HbA1C:  Recent Labs  12/03/15 0700  HGBA1C 5.7*   CBG: No results for input(s): GLUCAP in the last 168 hours. Lipid Profile:  Recent Labs  12/03/15 0700  CHOL 176  HDL 47  LDLCALC 90  TRIG 196*  CHOLHDL 3.7   Thyroid Function Tests:  Recent Labs  12/03/15 0700  TSH 1.044   Anemia Panel: No results for input(s): VITAMINB12, FOLATE, FERRITIN, TIBC, IRON, RETICCTPCT in the last 72 hours. Urine analysis:    Component Value Date/Time   COLORURINE YELLOW 07/28/2015 1027   APPEARANCEUR CLEAR 07/28/2015 1027   LABSPEC 1.011 07/28/2015 1027   PHURINE 6.0 07/28/2015 1027   GLUCOSEU NEGATIVE 07/28/2015 1027   HGBUR TRACE (A) 07/28/2015 1027   BILIRUBINUR NEGATIVE 07/28/2015 1027   KETONESUR NEGATIVE 07/28/2015 1027   PROTEINUR NEGATIVE 07/28/2015 1027   UROBILINOGEN 1.0 05/23/2012 1359   NITRITE NEGATIVE 07/28/2015 1027   LEUKOCYTESUR MODERATE (A) 07/28/2015 1027   Sepsis Labs: @LABRCNTIP (procalcitonin:4,lacticidven:4)  )No results found for this or any previous visit (from the past 240 hour(s)).       Radiology Studies: Dg Lumbar Spine Complete  Result Date: 12/02/2015 CLINICAL DATA:  Pain following recent fall EXAM: LUMBAR SPINE - COMPLETE 4+ VIEW COMPARISON:  None. FINDINGS: Frontal, lateral, spot lumbosacral lateral, and bilateral oblique views were obtained. There are 5 non-rib-bearing lumbar type vertebral bodies. There is no fracture or spondylolisthesis. Disc spaces appear normal. There is no appreciable facet arthropathy. There is extensive aortoiliac atherosclerotic calcification. IMPRESSION: No fracture or spondylolisthesis. No appreciable disc space narrowing or facet arthropathy. There is extensive aortoiliac atherosclerosis. Electronically Signed   By: Lowella Grip III M.D.   On: 12/02/2015 15:21   Ct  Head Wo Contrast  Addendum Date: 12/02/2015   ADDENDUM REPORT: 12/02/2015 17:16 ADDENDUM: Case discussed with clinical service; patient has ~2 months of right leg weakness. Since 08/02/15 subacute to chronic infarct has developed in the distal left ACA territory along the parasagittal left frontal lobe. This infarct is confluent with the patient's more chronic watershed infarct. Electronically Signed   By: Monte Fantasia M.D.   On: 12/02/2015 17:16   Result Date: 12/02/2015 CLINICAL DATA:  Right-sided weakness.  Fall. EXAM: CT HEAD WITHOUT CONTRAST TECHNIQUE: Contiguous axial images were obtained from the base of the skull through the vertex without intravenous contrast. COMPARISON:  08/02/2015 FINDINGS: Brain: Gliosis in the left temporal pole that is stable from prior. There is cortically based gliosis along the vertex of the left cerebral hemisphere, watershed infarct pattern in this patient with chronic left ICA occlusion. Remote left caudate head infarct. No acute infarct, hemorrhage, hydrocephalus, or mass. Vascular: Asymmetric carotid siphon calcification on the left. Skull: Remote left occipital bone fracture. Remote bilateral craniotomy. Sinuses/Orbits: Negative Other: None IMPRESSION: 1. No acute finding. 2. 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 3. Left temporal pole gliosis, possibly posttraumatic given location and remote left occipital fracture. Electronically  Signed: By: Monte Fantasia M.D. On: 12/02/2015 16:55   Mr Jodene Nam Head Wo Contrast  Result Date: 12/02/2015 CLINICAL DATA:  Syncope and right leg weakness. EXAM: MRA HEAD WITHOUT CONTRAST TECHNIQUE: Angiographic images of the Circle of Willis were obtained using MRA technique without intravenous contrast. COMPARISON:  Brain MRI same day, CTA head 08/02/2015 FINDINGS: The examination is markedly degraded by patient motion. Intracranial internal carotid arteries: The right internal carotid artery is grossly normal,  though obscured by motion. The left internal carotid artery remains occluded, as demonstrated on prior CTA. Anterior cerebral arteries: Normal. Middle cerebral arteries: The left MCA is diminutive compared to the right. The distal MCA distribution circulation is mildly decreased on the left. Posterior communicating arteries: Not visualized Posterior cerebral arteries: Normal. Basilar artery: Normal. Vertebral arteries: Right dominant Normal. Superior cerebellar arteries: Normal. Anterior inferior cerebellar arteries: Not clearly visualized, which is not uncommon. Posterior inferior cerebellar arteries: Normal on the left. Not clearly visualized on the right. IMPRESSION: 1. Despite efforts by the technologist and patient, motion artifact is present on today's examination and could not be eliminated. This reduces the sensitivity and specificity of the study. 2. Within the above limitation, there is unchanged occlusion of the left internal carotid artery with attenuation of the left MCA, and slightly decreased left distal MCA distribution circulation. 3. No other identified high-grade stenosis or occlusion. Electronically Signed   By: Ulyses Jarred M.D.   On: 12/02/2015 22:52   Mr Brain Wo Contrast  Result Date: 12/02/2015 CLINICAL DATA:  Right leg weakness EXAM: MRI HEAD WITHOUT CONTRAST TECHNIQUE: Multiplanar, multiecho pulse sequences of the brain and surrounding structures were obtained without intravenous contrast. COMPARISON:  Head CT 12/02/2015, brain MRI 08/01/2015 FINDINGS: Brain: There is multifocal diffusion restriction within the left frontal and anterior parietal lobes in a watershed distribution. The midline structures are normal. Areas of cystic change in the left watershed distribution and anterior left temporal lobe are unchanged. There is confluent hyperintense T2-weighted signal within the periventricular white matter, most often seen in the setting of chronic microvascular ischemia. No mass  lesion or midline shift. No hydrocephalus or extra-axial fluid collection. Vascular: Chronic occlusion of the left internal carotid artery. No evidence of chronic microhemorrhage or amyloid angiopathy. Skull and upper cervical spine: Changes of remote bilateral craniotomies. Sinuses/Orbits: No fluid levels or advanced mucosal thickening. No mastoid effusion. Normal orbits. IMPRESSION: 1. Acute left frontoparietal watershed distribution infarct, superimposed on chronic infarct in this location. No acute hemorrhage or significant mass effect. 2. Findings of multiple remote infarcts and multiple areas of encephalomalacia including the left frontal and parietal lobes and both anterior temporal lobes. 3. Chronic occlusion of the left internal carotid artery. These results will be called to the ordering clinician or representative by the Radiologist Assistant, and communication documented in the PACS or zVision Dashboard. Electronically Signed   By: Ulyses Jarred M.D.   On: 12/02/2015 19:34   Dg Hip Unilat With Pelvis 2-3 Views Right  Result Date: 12/02/2015 CLINICAL DATA:  Right hip pain for 2 months. EXAM: DG HIP (WITH OR WITHOUT PELVIS) 2-3V RIGHT COMPARISON:  Hip radiograph 12/02/2015 FINDINGS: There is IIA right femoral antegrade intra medullary rod with interlocking component in the right femoral neck. Callus formation along the midshaft of the right femur is again noted. There is no acute hardware abnormality identified. The joint space of the right hip is unchanged. No pelvic fracture. IMPRESSION: Unchanged appearance of right femoral intra medullary rod traversing side of prior  femoral fracture. Electronically Signed   By: Ulyses Jarred M.D.   On: 12/02/2015 18:57   Dg Hip Unilat W Or Wo Pelvis 2-3 Views Right  Result Date: 12/02/2015 CLINICAL DATA:  Pain with recent falls EXAM: DG HIP (WITH OR WITHOUT PELVIS) 2-3V RIGHT COMPARISON:  July 28, 2015 FINDINGS: Frontal pelvis as well as frontal and lateral  right hip images were obtained. There is evidence of previous surgical fixation for fracture of the mid right femur. There is a screw with its tip in the proximal right femoral head. There is evidence of an old healed fracture of the left ischium. No acute fracture or dislocation. Joint spaces appear normal. No erosive change. There is extensive iliac artery atherosclerosis. IMPRESSION: Evidence of old trauma. No acute fracture or dislocation. There is iliac artery atherosclerosis bilaterally. Joint spaces appear intact. Electronically Signed   By: Lowella Grip III M.D.   On: 12/02/2015 15:37   Dg Femur Min 2 Views Right  Result Date: 12/02/2015 CLINICAL DATA:  Pain following recent fall EXAM: RIGHT FEMUR 2 VIEWS COMPARISON:  July 28, 2015 FINDINGS: Frontal and lateral views were obtained. There is screw and rod fixation through a prior fracture of the mid right femur with bony remodeling at the fracture site. No acute fracture or dislocation. There is no knee joint effusion. There is mild osteoarthritic change in the right knee joint. IMPRESSION: Postoperative change with remodeling. No acute fracture or dislocation. Mild osteoarthritic change right knee joint. No knee joint effusion. Electronically Signed   By: Lowella Grip III M.D.   On: 12/02/2015 15:22        Scheduled Meds: . clopidogrel  75 mg Oral Daily  . divalproex  500 mg Oral TID  . enoxaparin (LOVENOX) injection  30 mg Subcutaneous QHS  . mometasone-formoterol  2 puff Inhalation BID  . QUEtiapine  25 mg Oral QHS   Continuous Infusions:     LOS: 1 day    Time spent: 6min    Domenic Polite, MD [facility] Pager [phone]  If 7PM-7AM, please contact night-coverage [email or web address] Password [email or web address] 12/04/2015, 11:55 AM

## 2015-12-04 NOTE — NC FL2 (Signed)
[place] LEVEL OF CARE SCREENING TOOL     IDENTIFICATION  Patient Name: Wanda Griffin Birthdate: 05/16/1955 Sex: female Admission Date (Current Location): 12/02/2015  [facility] and [place] Number:  Herbalist and Address:  [facility] [place] [place] [address] [place], [place] [address]      Provider Number: [id number]  Attending Physician Name and Address:  Domenic Polite, MD  Relative Name and Phone Number:       Current Level of Care: Hospital Recommended Level of Care: [place] Prior Approval Number:    Date Approved/Denied:   PASRR Number: [id number]  Discharge Plan: SNF    Current Diagnoses: Patient Active Problem List   Diagnosis Date Noted  . Right leg pain   . Marijuana abuse   . History of CVA (cerebrovascular accident)   . Closed fracture of right femur ([place])   . S/P ORIF (open reduction internal fixation) fracture   . History of brain tumor   . Adjustment disorder with mixed anxiety and depressed mood   . Right leg weakness 12/02/2015  . CVA (cerebral vascular accident) ([place]) 08/01/2015  . Other depression due to general medical condition   . Malnutrition of moderate degree 07/30/2015  . Femur fracture, right, closed, initial encounter 07/28/2015  . Femur fracture ([place]) 07/27/2015  . Anxiety and depression 07/27/2015  . Tobacco abuse 07/27/2015  . Macrocytosis without anemia 07/27/2015  . Hyperlipidemia 07/27/2015  . Chronic constipation 07/27/2015  . COPD (chronic obstructive pulmonary disease) ([place]) 07/27/2015  . Femur fracture, right ([place]) 07/27/2015  . Altered mental status 05/24/2012  . Seizure disorder ([place]) 05/24/2012  . Syncope and collapse 05/24/2012  . Polysubstance (excluding opioids) dependence, daily use ([place]) 05/24/2012    Orientation RESPIRATION BLADDER Height & Weight     Self, Time, Situation, Place  Normal Continent Weight: 90 lb 6.4 oz (41 kg) Height:  5\' 2"  (157.5  cm)  BEHAVIORAL SYMPTOMS/MOOD NEUROLOGICAL BOWEL NUTRITION STATUS      Continent Diet (Regular Diet)  AMBULATORY STATUS COMMUNICATION OF NEEDS Skin   Extensive Assist Verbally Normal                       Personal Care Assistance Level of Assistance  Bathing, Feeding, Dressing Bathing Assistance: Limited assistance Feeding assistance: Independent Dressing Assistance: Limited assistance     Functional Limitations Info  Sight, Hearing, Speech Sight Info: Adequate Hearing Info: Adequate Speech Info: Adequate    SPECIAL CARE FACTORS FREQUENCY  PT (By licensed PT), Speech therapy, OT (By licensed OT)     PT Frequency: 5 OT Frequency: 5     Speech Therapy Frequency: 5      Contractures Contractures Info: Not present    Additional Factors Info  Code Status, Psychotropic, Allergies Code Status Info: Full Code Allergies Info: Ampicillin, Asa Aspirin, Coconut Oil, Codeine, Colchicine, Penicillins, Toradol Ketorolac Tromethamine, Sulfamethoxazole Psychotropic Info: Medications         Current Medications (12/04/2015):  This is the current hospital active medication list Current Facility-Administered Medications  Medication Dose Route Frequency Provider Last Rate Last Dose  . acetaminophen (TYLENOL) tablet 650 mg  650 mg Oral Q4H PRN Ripudeep Krystal Eaton, MD       Or  . acetaminophen (TYLENOL) suppository 650 mg  650 mg Rectal Q4H PRN Ripudeep K Rai, MD      . ALPRAZolam Duanne Moron) tablet 0.25 mg  0.25 mg Oral Q12H PRN Ripudeep Krystal Eaton, MD      .  clopidogrel (PLAVIX) tablet 75 mg  75 mg Oral Daily Ripudeep Krystal Eaton, MD   75 mg at 12/04/15 0911  . divalproex (DEPAKOTE) DR tablet 500 mg  500 mg Oral TID Ripudeep Krystal Eaton, MD   500 mg at 12/04/15 0911  . enoxaparin (LOVENOX) injection 30 mg  30 mg Subcutaneous QHS Ripudeep K Rai, MD   30 mg at 12/03/15 2226  . mometasone-formoterol (DULERA) 200-5 MCG/ACT inhaler 2 puff  2 puff Inhalation BID Ripudeep Krystal Eaton, MD   2 puff at 12/04/15 0820  .  oxyCODONE-acetaminophen (PERCOCET/ROXICET) 5-325 MG per tablet 1 tablet  1 tablet Oral Q8H PRN Ripudeep Krystal Eaton, MD   1 tablet at 12/03/15 0355  . QUEtiapine (SEROQUEL) tablet 25 mg  25 mg Oral QHS Ripudeep Krystal Eaton, MD   25 mg at 12/03/15 2226     Discharge Medications: Please see discharge summary for a list of discharge medications.  Relevant Imaging Results:  Relevant Lab Results:   Additional Information SSN:  [id number]  Darden Dates, LCSW

## 2015-12-04 NOTE — Progress Notes (Signed)
I spoke with pt at bedside to discuss her rehab venue options. She prefers to be in [place] for her rehab. Recently pt in 08/2015 received SNF at [facility] and rehab and just returned home in October. I contacted Robyne Peers, listed as Legal guardian for pt. Malachy Mood states Davy has been pt's legal guardian since 03/2015. Pt's spouse unable to provide physical care of pt and that pt's Mother in law contacted DSS of pt's admission. Pt is legally incompetent due to an old TBI. Malachy Mood is requesting SNF for pt in [facility] and she provided after hours cell number for DSS as needed. I have discussed with RN CM and will alert SW of plan. We will sign off. [phone]

## 2015-12-04 NOTE — Progress Notes (Signed)
STROKE TEAM PROGRESS NOTE   HISTORY OF PRESENT ILLNESS (per record) Wanda Griffin is an 60 y.o. female with history of anxiety, seizures, tobacco abuse, poor historian, presented to ED with falls, syncopal episode and right leg weakness. History was obtained from the patient and her husband in the room. The patient reported that she was going to the bathroom today when she had a syncopal episode in the evening. She denied any palpitations, chest pain or shortness of breath, dizziness or lightheadedness prior to the event. Per patient's family, they had noticed that she was progressively getting weaker in the right leg in the last 1 month and in the last 1 week, her right leg "just quit". Patient however did not seek any medical attention. Patient has been falling recently, last fall was 2 days ago. She denied any back pain, nausea or vomiting any diarrhea.  Patient also reported that her right arm also has been getting weaker.  Patient currently is very labile. When asked why she is not eating much she becomes tearful. She states that her right arm and right leg are weaker than her left and there is also decreased sensation. Her last known well was unable to be determined. Patient was not administered IV t-PA secondary to unknown last known well. She was admitted for further evaluation and treatment.   SUBJECTIVE (INTERVAL HISTORY) Her husband and mother at the bedside.  Overall she feels her condition is completely resolved.    OBJECTIVE Temp:  [97.6 F (36.4 C)-99 F (37.2 C)] 98.1 F (36.7 C) (11/02 1033) Pulse Rate:  [40-82] 78 (11/02 1033) Cardiac Rhythm: Normal sinus rhythm (11/01 1900) Resp:  [16-20] 20 (11/02 1033) BP: (118-149)/(48-76) 147/76 (11/02 1033) SpO2:  [93 %-98 %] 94 % (11/02 1033)  CBC:  Recent Labs Lab 12/02/15 1638  WBC 7.0  NEUTROABS 3.5  HGB 14.6  HCT 44.3  MCV 89.9  PLT 99991111    Basic Metabolic Panel:  Recent Labs Lab 12/02/15 1638  NA 140  K 3.5  CL  103  CO2 25  GLUCOSE 85  BUN 7  CREATININE 0.66  CALCIUM 9.7    Lipid Panel:    Component Value Date/Time   CHOL 176 12/03/2015 0700   TRIG 196 (H) 12/03/2015 0700   HDL 47 12/03/2015 0700   CHOLHDL 3.7 12/03/2015 0700   VLDL 39 12/03/2015 0700   LDLCALC 90 12/03/2015 0700   HgbA1c:  Lab Results  Component Value Date   HGBA1C 5.7 (H) 12/03/2015   Urine Drug Screen:    Component Value Date/Time   LABOPIA POSITIVE (A) 05/23/2012 1359   COCAINSCRNUR NONE DETECTED 05/23/2012 1359   LABBENZ POSITIVE (A) 05/23/2012 1359   AMPHETMU NONE DETECTED 05/23/2012 1359   THCU POSITIVE (A) 05/23/2012 1359   LABBARB NONE DETECTED 05/23/2012 1359      IMAGING  Dg Lumbar Spine Complete  Result Date: 12/02/2015 CLINICAL DATA:  Pain following recent fall EXAM: LUMBAR SPINE - COMPLETE 4+ VIEW COMPARISON:  None. FINDINGS: Frontal, lateral, spot lumbosacral lateral, and bilateral oblique views were obtained. There are 5 non-rib-bearing lumbar type vertebral bodies. There is no fracture or spondylolisthesis. Disc spaces appear normal. There is no appreciable facet arthropathy. There is extensive aortoiliac atherosclerotic calcification. IMPRESSION: No fracture or spondylolisthesis. No appreciable disc space narrowing or facet arthropathy. There is extensive aortoiliac atherosclerosis. Electronically Signed   By: Lowella Grip III M.D.   On: 12/02/2015 15:21   Ct Head Wo Contrast  Addendum Date: 12/02/2015  ADDENDUM REPORT: 12/02/2015 17:16 ADDENDUM: Case discussed with clinical service; patient has ~2 months of right leg weakness. Since 08/02/15 subacute to chronic infarct has developed in the distal left ACA territory along the parasagittal left frontal lobe. This infarct is confluent with the patient's more chronic watershed infarct. Electronically Signed   By: Monte Fantasia M.D.   On: 12/02/2015 17:16   Result Date: 12/02/2015 CLINICAL DATA:  Right-sided weakness.  Fall. EXAM: CT HEAD  WITHOUT CONTRAST TECHNIQUE: Contiguous axial images were obtained from the base of the skull through the vertex without intravenous contrast. COMPARISON:  08/02/2015 FINDINGS: Brain: Gliosis in the left temporal pole that is stable from prior. There is cortically based gliosis along the vertex of the left cerebral hemisphere, watershed infarct pattern in this patient with chronic left ICA occlusion. Remote left caudate head infarct. No acute infarct, hemorrhage, hydrocephalus, or mass. Vascular: Asymmetric carotid siphon calcification on the left. Skull: Remote left occipital bone fracture. Remote bilateral craniotomy. Sinuses/Orbits: Negative Other: None IMPRESSION: 1. No acute finding. 2. 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 3. Left temporal pole gliosis, possibly posttraumatic given location and remote left occipital fracture. Electronically Signed: By: Monte Fantasia M.D. On: 12/02/2015 16:55   Mr Jodene Nam Head Wo Contrast  Result Date: 12/02/2015 CLINICAL DATA:  Syncope and right leg weakness. EXAM: MRA HEAD WITHOUT CONTRAST TECHNIQUE: Angiographic images of the Circle of Willis were obtained using MRA technique without intravenous contrast. COMPARISON:  Brain MRI same day, CTA head 08/02/2015 FINDINGS: The examination is markedly degraded by patient motion. Intracranial internal carotid arteries: The right internal carotid artery is grossly normal, though obscured by motion. The left internal carotid artery remains occluded, as demonstrated on prior CTA. Anterior cerebral arteries: Normal. Middle cerebral arteries: The left MCA is diminutive compared to the right. The distal MCA distribution circulation is mildly decreased on the left. Posterior communicating arteries: Not visualized Posterior cerebral arteries: Normal. Basilar artery: Normal. Vertebral arteries: Right dominant Normal. Superior cerebellar arteries: Normal. Anterior inferior cerebellar arteries: Not clearly  visualized, which is not uncommon. Posterior inferior cerebellar arteries: Normal on the left. Not clearly visualized on the right. IMPRESSION: 1. Despite efforts by the technologist and patient, motion artifact is present on today's examination and could not be eliminated. This reduces the sensitivity and specificity of the study. 2. Within the above limitation, there is unchanged occlusion of the left internal carotid artery with attenuation of the left MCA, and slightly decreased left distal MCA distribution circulation. 3. No other identified high-grade stenosis or occlusion. Electronically Signed   By: Ulyses Jarred M.D.   On: 12/02/2015 22:52   Mr Brain Wo Contrast  Result Date: 12/02/2015 CLINICAL DATA:  Right leg weakness EXAM: MRI HEAD WITHOUT CONTRAST TECHNIQUE: Multiplanar, multiecho pulse sequences of the brain and surrounding structures were obtained without intravenous contrast. COMPARISON:  Head CT 12/02/2015, brain MRI 08/01/2015 FINDINGS: Brain: There is multifocal diffusion restriction within the left frontal and anterior parietal lobes in a watershed distribution. The midline structures are normal. Areas of cystic change in the left watershed distribution and anterior left temporal lobe are unchanged. There is confluent hyperintense T2-weighted signal within the periventricular white matter, most often seen in the setting of chronic microvascular ischemia. No mass lesion or midline shift. No hydrocephalus or extra-axial fluid collection. Vascular: Chronic occlusion of the left internal carotid artery. No evidence of chronic microhemorrhage or amyloid angiopathy. Skull and upper cervical spine: Changes of remote bilateral craniotomies. Sinuses/Orbits: No fluid  levels or advanced mucosal thickening. No mastoid effusion. Normal orbits. IMPRESSION: 1. Acute left frontoparietal watershed distribution infarct, superimposed on chronic infarct in this location. No acute hemorrhage or significant mass  effect. 2. Findings of multiple remote infarcts and multiple areas of encephalomalacia including the left frontal and parietal lobes and both anterior temporal lobes. 3. Chronic occlusion of the left internal carotid artery. These results will be called to the ordering clinician or representative by the Radiologist Assistant, and communication documented in the PACS or zVision Dashboard. Electronically Signed   By: Ulyses Jarred M.D.   On: 12/02/2015 19:34   Dg Hip Unilat With Pelvis 2-3 Views Right  Result Date: 12/02/2015 CLINICAL DATA:  Right hip pain for 2 months. EXAM: DG HIP (WITH OR WITHOUT PELVIS) 2-3V RIGHT COMPARISON:  Hip radiograph 12/02/2015 FINDINGS: There is IIA right femoral antegrade intra medullary rod with interlocking component in the right femoral neck. Callus formation along the midshaft of the right femur is again noted. There is no acute hardware abnormality identified. The joint space of the right hip is unchanged. No pelvic fracture. IMPRESSION: Unchanged appearance of right femoral intra medullary rod traversing side of prior femoral fracture. Electronically Signed   By: Ulyses Jarred M.D.   On: 12/02/2015 18:57   Dg Hip Unilat W Or Wo Pelvis 2-3 Views Right  Result Date: 12/02/2015 CLINICAL DATA:  Pain with recent falls EXAM: DG HIP (WITH OR WITHOUT PELVIS) 2-3V RIGHT COMPARISON:  July 28, 2015 FINDINGS: Frontal pelvis as well as frontal and lateral right hip images were obtained. There is evidence of previous surgical fixation for fracture of the mid right femur. There is a screw with its tip in the proximal right femoral head. There is evidence of an old healed fracture of the left ischium. No acute fracture or dislocation. Joint spaces appear normal. No erosive change. There is extensive iliac artery atherosclerosis. IMPRESSION: Evidence of old trauma. No acute fracture or dislocation. There is iliac artery atherosclerosis bilaterally. Joint spaces appear intact.  Electronically Signed   By: Lowella Grip III M.D.   On: 12/02/2015 15:37   Dg Femur Min 2 Views Right  Result Date: 12/02/2015 CLINICAL DATA:  Pain following recent fall EXAM: RIGHT FEMUR 2 VIEWS COMPARISON:  July 28, 2015 FINDINGS: Frontal and lateral views were obtained. There is screw and rod fixation through a prior fracture of the mid right femur with bony remodeling at the fracture site. No acute fracture or dislocation. There is no knee joint effusion. There is mild osteoarthritic change in the right knee joint. IMPRESSION: Postoperative change with remodeling. No acute fracture or dislocation. Mild osteoarthritic change right knee joint. No knee joint effusion. Electronically Signed   By: Lowella Grip III M.D.   On: 12/02/2015 15:22   Carotid Duplex (Doppler) has been completed.   Findings suggest 1-39% right internal carotid artery stenosis. The left internal carotid artery is chronically occluded. There is a left carotid to brachial artery saphenous vein bypass; the anastomosis at the left carotid artery is patent. Unable to visualize the bypass in its entirety due to patient body habitus. Vertebral arteries are patent with antegrade flow bilaterally. The left vertebral artery exhibits an atypical flow, possibly due to history of subclavian steal.  Bilateral lower extremity venous duplex completed. Bilateral lower extremities are negative for deep vein thrombosis. There is no evidence of Baker's cyst bilaterally.   PHYSICAL EXAM Frail cachectic looking Caucasian lady not in distress.  . Afebrile. Head is nontraumatic.  Neck is supple without bruit.    Cardiac exam no murmur or gallop. Lungs are clear to auscultation. Distal pulses are well felt. Neurological Exam :  Awake alert oriented 3 with normal speech and language function. Extraocular moments are full range but she has diminished blink to threat on the right compared to the left. Fundi were not visualized. Vision  acuity seems adequate. Mild right lower facial weakness. Tongue midline. Right hemiplegia with 3/5 strength with significant weakness of right grip and intrinsic hand muscles and right hip flexors. Tone is slightly diminished on the right compared to left. Left side of normal strength on Carnation sensation. Deep tendon reflexes are depressed on the right and normal on the left. Right plantar equivocal left downgoing. Gait was not tested. ASSESSMENT/PLAN Ms. KAMORIA VITTITOW is a 60 y.o. female with history of anxiety, seizures, tobacco abuse presenting with falls, syncopal episode and right leg weakness. She did not receive IV t-PA due to no LKW.   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 Infarct secondary to large vessel disease  .  MRI  left frontoparietal watershed infarct superimposed on old infarct in same location  MRA  left ICA occlusion  Carotid Doppler  , left ICA occluded. Left vertebral atypical flow, possibly subclavian steal  Lower extremity venous Dopplers negative  2D Echo  pending   LDL 90  HgbA1c 5.7  Lovenox 30 mg sq daily for VTE prophylaxis  Diet regular Room service appropriate? Yes; Fluid consistency: Thin  clopidogrel 75 mg daily prior to admission, now on clopidogrel 75 mg daily. Have discussed dual antiplatelet therapy, however Patient is allergic to aspirin (rash)  Patient counseled to be compliant with her antithrombotic medications  Ongoing aggressive stroke risk factor management  Therapy recommendations:  CIR  Disposition:  pending  (patient has a legal guardian appointed by [facility])  Arterial Disase  L carotid occlusion  Left subclavian steal, status post bypass  Hypertension  Stable  Long-term BP goal normotensive  Hyperlipidemia  Home meds:  No statin  LDL 90, goal < 70  Continue statin at discharge  Other Stroke Risk Factors  Cigarette smoker, advised to stop smoking  Marijuana  use  Coronary artery disease  Other Active Problems  R thigh/Hip pain  Syncope and collapse  COPD  Malnutrition of moderate degree, Body mass index is 16.53 kg/m.   Hx brain tumor s/p resection  IBS  Anxiety, Depression  Seziures, on [facility] day # [place] for Pager information 12/04/2015 4:20 PM  I have personally examined this patient, reviewed notes, independently viewed imaging studies, participated in medical decision making and plan of care.ROS completed by me personally and pertinent positives fully documented  I have made any additions or clarifications directly to the above note. Agree with note above. The patient has presented with right hemiplegia secondary to left brain watershed infarcts due to failure of collaterals with known left chronic carotid occlusion. Hives consult the patient quit smoking completely and to make lifestyle changes and maintain aggressive risk factor modification. Long discussion the patient and husband and mother at the bedside and answered questions. Greater than 50% time during this 35 minute visit was spent on counseling and coordination of care about stroke risk, prevention and treatment.  The patient also expressed interest in participation in stroke atrial fibrillation trial and will be given   information to review and decide. No study  specific procedure was done prior to patient signing consent form.  Antony Contras, MD Medical Director [facility] Stroke Center Pager: [phone] 12/04/2015 4:59 PM  To contact Stroke Continuity provider, please refer to [email or web address] After hours, contact General Neurology

## 2015-12-05 ENCOUNTER — Encounter: Payer: Self-pay | Admitting: *Deleted

## 2015-12-05 DIAGNOSIS — Z006 Encounter for examination for normal comparison and control in clinical research program: Secondary | ICD-10-CM

## 2015-12-05 MED ORDER — SODIUM CHLORIDE 0.9 % IV SOLN
INTRAVENOUS | Status: DC
  Administered 2015-12-05 – 2015-12-06 (×2): via INTRAVENOUS

## 2015-12-05 NOTE — Progress Notes (Signed)
STROKE TEAM PROGRESS NOTE   HISTORY OF PRESENT ILLNESS (per record) Wanda Griffin is an 60 y.o. female with history of anxiety, seizures, tobacco abuse, poor historian, presented to ED with falls, syncopal episode and right leg weakness. History was obtained from the patient and her husband in the room. The patient reported that she was going to the bathroom today when she had a syncopal episode in the evening. She denied any palpitations, chest pain or shortness of breath, dizziness or lightheadedness prior to the event. Per patient's family, they had noticed that she was progressively getting weaker in the right leg in the last 1 month and in the last 1 week, her right leg "just quit". Patient however did not seek any medical attention. Patient has been falling recently, last fall was 2 days ago. She denied any back pain, nausea or vomiting any diarrhea.  Patient also reported that her right arm also has been getting weaker.  Patient currently is very labile. When asked why she is not eating much she becomes tearful. She states that her right arm and right leg are weaker than her left and there is also decreased sensation. Her last known well was unable to be determined. Patient was not administered IV t-PA secondary to unknown last known well. She was admitted for further evaluation and treatment.   SUBJECTIVE (INTERVAL HISTORY) Her family is not at the bedside.  Overall she feels her condition is completely resolved. No complaints today   OBJECTIVE Temp:  [98 F (36.7 C)-98.8 F (37.1 C)] 98 F (36.7 C) (11/03 1416) Pulse Rate:  [76-89] 89 (11/03 1416) Cardiac Rhythm: Junctional rhythm (11/03 0700) Resp:  [16-19] 18 (11/03 1416) BP: (135-155)/(53-74) 135/67 (11/03 1416) SpO2:  [93 %-97 %] 97 % (11/03 1416)  CBC:   Recent Labs Lab 12/02/15 1638  WBC 7.0  NEUTROABS 3.5  HGB 14.6  HCT 44.3  MCV 89.9  PLT 99991111    Basic Metabolic Panel:   Recent Labs Lab 12/02/15 1638  NA 140   K 3.5  CL 103  CO2 25  GLUCOSE 85  BUN 7  CREATININE 0.66  CALCIUM 9.7    Lipid Panel:     Component Value Date/Time   CHOL 176 12/03/2015 0700   TRIG 196 (H) 12/03/2015 0700   HDL 47 12/03/2015 0700   CHOLHDL 3.7 12/03/2015 0700   VLDL 39 12/03/2015 0700   LDLCALC 90 12/03/2015 0700   HgbA1c:  Lab Results  Component Value Date   HGBA1C 5.7 (H) 12/03/2015   Urine Drug Screen:     Component Value Date/Time   LABOPIA POSITIVE (A) 05/23/2012 1359   COCAINSCRNUR NONE DETECTED 05/23/2012 1359   LABBENZ POSITIVE (A) 05/23/2012 1359   AMPHETMU NONE DETECTED 05/23/2012 1359   THCU POSITIVE (A) 05/23/2012 1359   LABBARB NONE DETECTED 05/23/2012 1359      IMAGING  No results found. Carotid Duplex (Doppler) has been completed.   Findings suggest 1-39% right internal carotid artery stenosis. The left internal carotid artery is chronically occluded. There is a left carotid to brachial artery saphenous vein bypass; the anastomosis at the left carotid artery is patent. Unable to visualize the bypass in its entirety due to patient body habitus. Vertebral arteries are patent with antegrade flow bilaterally. The left vertebral artery exhibits an atypical flow, possibly due to history of subclavian steal.  Bilateral lower extremity venous duplex completed. Bilateral lower extremities are negative for deep vein thrombosis. There is no evidence of Baker's  cyst bilaterally.   PHYSICAL EXAM Frail cachectic looking Caucasian lady not in distress.  . Afebrile. Head is nontraumatic. Neck is supple without bruit.    Cardiac exam no murmur or gallop. Lungs are clear to auscultation. Distal pulses are well felt. Neurological Exam :  Awake alert oriented 3 with normal speech and language function. Extraocular moments are full range but she has diminished blink to threat on the right compared to the left. Fundi were not visualized. Vision acuity seems adequate. Mild right lower facial  weakness. Tongue midline. Right hemiplegia with 3/5 strength with significant weakness of right grip and intrinsic hand muscles and right hip flexors. Tone is slightly diminished on the right compared to left. Left side of normal strength on Carnation sensation. Deep tendon reflexes are depressed on the right and normal on the left. Right plantar equivocal left downgoing. Gait was not tested. ASSESSMENT/PLAN [name] is a 60 y.o. female with history of anxiety, seizures, tobacco abuse presenting with falls, syncopal episode and right leg weakness. She did not receive IV t-PA due to no LKW.   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 Infarct secondary to large vessel disease  .  MRI  left frontoparietal watershed infarct superimposed on old infarct in same location  MRA  left ICA occlusion  Carotid Doppler  , left ICA occluded. Left vertebral atypical flow, possibly subclavian steal  Lower extremity venous Dopplers negative  2D Echo  pending   LDL 90  HgbA1c 5.7  Lovenox 30 mg sq daily for VTE prophylaxis Diet regular Room service appropriate? Yes; Fluid consistency: Thin  clopidogrel 75 mg daily prior to admission, now on clopidogrel 75 mg daily. Have discussed dual antiplatelet therapy, however Patient is allergic to aspirin (rash)  Patient counseled to be compliant with her antithrombotic medications  Ongoing aggressive stroke risk factor management  Therapy recommendations:  CIR  Disposition:  pending  (patient has a legal guardian appointed by [facility])  Arterial Disase  L carotid occlusion  Left subclavian steal, status post bypass  Hypertension  Stable  Long-term BP goal normotensive  Hyperlipidemia  Home meds:  No statin  LDL 90, goal < 70  Continue statin at discharge  Other Stroke Risk Factors  Cigarette smoker, advised to stop smoking  Marijuana use  Coronary artery disease  Other Active  Problems  R thigh/Hip pain  Syncope and collapse  COPD  Malnutrition of moderate degree, Body mass index is 16.53 kg/m.   Hx brain tumor s/p resection  IBS  Anxiety, Depression  Seziures, on [facility] day # [place] for Pager information 12/05/2015 3:33 PM  I have personally examined this patient, reviewed notes, independently viewed imaging studies, participated in medical decision making and plan of care.ROS completed by me personally and pertinent positives fully documented  I have made any additions or clarifications directly to the above note. Agree with note above. The patient has presented with right hemiplegia secondary to left brain watershed infarcts due to failure of collaterals with known left chronic carotid occlusion. Hives consult the patient quit smoking completely and to make lifestyle changes and maintain aggressive risk factor modification. Long discussion the patient and husband and mother at the bedside and answered questions. Greater than 50% time during this 15 minute visit was spent on counseling and coordination of care about stroke risk, prevention and treatment.  The patient also expressed interest in  participation in stroke atrial fibrillation trial and will be given   information to review and decide. No study specific procedure was done prior to patient signing consent form.. Stroke team will sign off. Follow-up as an outpatient in stroke clinic in 6 weeks.  Antony Contras, MD Medical Director [facility] Stroke Center Pager: [phone] 12/05/2015 3:33 PM  To contact Stroke Continuity provider, please refer to [email or web address] After hours, contact General Neurology

## 2015-12-05 NOTE — Progress Notes (Addendum)
PROGRESS NOTE    Wanda Griffin  [id number] DOB: [birth year] DOA: 12/02/2015 PCP: [name], MD  Brief Narrative: 60 year old female with history of anxiety, seizures, tobacco abuse, poor historian, presented to ED with falls, syncopal episode and right leg weakness. patient reported that she was going to the bathroom 10/30 when she had a syncopal episode , Per patient's family, they had noticed that she was progressively getting weaker in the right leg in the last 1 month and in the last 1 week, her right leg "just quit". Patient however did not seek any medical attention. 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 -continue Plavix, this was started in June after last CVA -LDL-90 and Hba1c 5.7 -Pt/OT/SLP consulting, CIR consulted, SNF recommended -neurology consulting -ECHO with normal EF and wall motion -po intake remains poor, will hydrate with IVF today  Dehydration/poor PO intake -depressed after CVA and appears dry clinically with decreased skin turgor and dry mucous membranes -will hydrate with IVF today, encourage Po intake  R thigh/Hip pain -H/o ORIF in June 2017 -Xray unremarkable except for post op changes -No pain with passive RoM of R hip  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 -has a guardian at [employer] use -counseled    Syncope and collapse - no events on tele, FU ECHO - no history suggestive of seizure    COPD (chronic obstructive pulmonary disease) ([place]) - Stable continue dulera     Malnutrition of moderate degree - Patient reports poor appetite and losing weight, appears to be deconditioned  -  Nutrition consulted   anxiety/depression Continue Seroquel, Xanax  History of seizures Continue Depakote  DVT prophylaxis:  Lovenox   CODE STATUS:  Full CODE STATUS Family Communication:None at bedside Disposition Plan:needs SNF   Consultants:  Neurology  Subjective: R side remains weak, depressed, eating very less  Objective: Vitals:   12/04/15 2130 12/05/15 0055 12/05/15 0534 12/05/15 1040  BP: (!) 155/65 140/74 (!) 147/55 (!) 146/67  Pulse: 77 76 81 76  Resp: 18 18 16 16   Temp: 98.2 F (36.8 C) 98 F (36.7 C) 98.8 F (37.1 C) 98.7 F (37.1 C)  TempSrc: Oral Oral Oral Oral  SpO2: 95% 97% 93% 95%  Weight:      Height:        Intake/Output Summary (Last 24 hours) at 12/05/15 1059 Last data filed at 12/05/15 0810  Gross per 24 hour  Intake              120 ml  Output              350 ml  Net             -230 ml   Filed Weights   12/02/15 1256 12/02/15 2024  Weight: 43.1 kg (95 lb) 41 kg (90 lb 6.4 oz)    Examination:  General exam: Appears calm and comfortable, tearful, anxious, no distress Respiratory system: Clear to auscultation. Respiratory effort normal. Cardiovascular system: S1 & S2 heard, RRR. No JVD, murmurs, rubs, gallops or clicks. No pedal edema. Gastrointestinal system: Abdomen is nondistended, soft and nontender. No organomegaly or masses felt. Normal bowel sounds heard.  Central nervous system: Alert and oriented. No focal neurological deficits. Extremities: R side almost Flaccid R leg Skin: No rashes, lesions or ulcers Psychiatry: Judgement and insight appear normal. Flat affect.     Data Reviewed: I have personally reviewed following labs and imaging studies  CBC:  Recent Labs Lab 12/02/15 1638  WBC 7.0  NEUTROABS 3.5  HGB 14.6  HCT 44.3  MCV 89.9  PLT 99991111   Basic Metabolic Panel:  Recent Labs Lab 12/02/15 1638  NA 140  K 3.5  CL 103  CO2 25  GLUCOSE 85  BUN 7  CREATININE 0.66  CALCIUM 9.7   GFR: Estimated  Creatinine Clearance: 49 mL/min (by C-G formula based on SCr of 0.66 mg/dL). Liver Function Tests: No results for input(s): AST, ALT, ALKPHOS, BILITOT, PROT, ALBUMIN in the last 168 hours. No results for input(s): LIPASE, AMYLASE in the last 168 hours. No results for input(s): AMMONIA in the last 168 hours. Coagulation Profile: No results for input(s): INR, PROTIME in the last 168 hours. Cardiac Enzymes: No results for input(s): CKTOTAL, CKMB, CKMBINDEX, TROPONINI in the last 168 hours. BNP (last 3 results) No results for input(s): PROBNP in the last 8760 hours. HbA1C:  Recent Labs  12/03/15 0700  HGBA1C 5.7*   CBG: No results for input(s): GLUCAP in the last 168 hours. Lipid Profile:  Recent Labs  12/03/15 0700  CHOL 176  HDL 47  LDLCALC 90  TRIG 196*  CHOLHDL 3.7   Thyroid Function Tests:  Recent Labs  12/03/15 0700  TSH 1.044   Anemia Panel: No results for input(s): VITAMINB12, FOLATE, FERRITIN, TIBC, IRON, RETICCTPCT in the last 72 hours. Urine analysis:    Component Value Date/Time   COLORURINE YELLOW 07/28/2015 1027   APPEARANCEUR CLEAR 07/28/2015 1027   LABSPEC 1.011 07/28/2015 1027   PHURINE 6.0 07/28/2015 1027   GLUCOSEU NEGATIVE 07/28/2015 1027   HGBUR TRACE (A) 07/28/2015 1027   BILIRUBINUR NEGATIVE 07/28/2015 1027   KETONESUR NEGATIVE 07/28/2015 1027   PROTEINUR NEGATIVE 07/28/2015 1027   UROBILINOGEN 1.0 05/23/2012 1359   NITRITE NEGATIVE 07/28/2015 1027   LEUKOCYTESUR MODERATE (A) 07/28/2015 1027   Sepsis Labs: @LABRCNTIP (procalcitonin:4,lacticidven:4)  )No results found for this or any previous visit (from the past 240 hour(s)).       Radiology Studies: No results found.      Scheduled Meds: . clopidogrel  75 mg Oral Daily  . divalproex  500 mg Oral TID  . enoxaparin (LOVENOX) injection  30 mg Subcutaneous QHS  . mometasone-formoterol  2 puff Inhalation BID  . QUEtiapine  25 mg Oral QHS   Continuous Infusions: . sodium  chloride 75 mL/hr at 12/05/15 1048     LOS: 2 days    Time spent: 42min    Domenic Polite, MD [facility] Pager [phone]  If 7PM-7AM, please contact night-coverage [email or web address] Password [email or web address] 12/05/2015, 10:59 AM

## 2015-12-05 NOTE — Progress Notes (Signed)
Chart reviewed for possible candidate for the [facility] research study. Per notes "unable to determine last know normal" patient is excluded. Patient is most likely out of the protocol inclusion window.

## 2015-12-05 NOTE — Progress Notes (Signed)
Physical Therapy Treatment Patient Details Name: Wanda Griffin MRN: [id number] DOB: February 03, 1955 Today's Date: 12/05/2015    History of Present Illness 60 y.o.femalewith history of anxiety, seizures, 07/2015 fall with Rt femur ORIF, tobacco abuse, poor historian, presented to ED with falls, syncopal episode and right leg weakness. Per patient's family, she was progressively getting weaker in the right leg in the last 1 month and in the last 1 week, her right leg "just quit". Patient however did not seek any medical attention. Patient has been falling recently, last fall was 2 days ago. MRI-Acute left frontoparietal watershed distribution infarct, superimposed on chronic infarct in this location. Chronic occlusion of the left internal carotid artery; multiple remote infarcts left frontal and parietal lobes and both anterior temporal lobes    PT Comments    Patient again with word-finding issues. Speaking in one to two word phrases. Adamantly asking for "bedside commode," yet when PT brought to bedside she repeatedly said no and could not find the word for bedpan. Remains labile. Noted decr attention to RUE as moving to EOB and back into bed.   Follow Up Recommendations  SNF (guardian prefers pt close to home)     Equipment Recommendations  Other (comment) (TBD)    Recommendations for Other Services       Precautions / Restrictions Precautions Precautions: Fall;Other (comment) Precaution Comments: R knee buckles in stance phase.   Restrictions Weight Bearing Restrictions: No    Mobility  Bed Mobility Overal bed mobility: Needs Assistance Bed Mobility: Supine to Sit;Sit to Supine;Rolling Rolling: Supervision (to her right with rail)   Supine to sit: Mod assist Sit to supine: Min assist   General bed mobility comments: Assist for RLE on and off of bed, initially with posterior losses of balance (as attempting to scoot hips out to EOB)  Transfers Overall transfer level: Needs  assistance Equipment used: Rolling walker (2 wheeled) Transfers: Sit to/from Stand;Lateral/Scoot Transfers Sit to Stand: Min assist        Lateral/Scoot Transfers: Min guard General transfer comment: x 3; incr pain in RLE with weightbearing and attempting knee extension with foot flat; side step to HOB x 2 steps, second attempt could not step  Ambulation/Gait                 Stairs            Wheelchair Mobility    Modified Rankin (Stroke Patients Only)       Balance Overall balance assessment: Needs assistance Sitting-balance support: No upper extremity supported;Feet supported (once at EOB with feet flat) Sitting balance-Leahy Scale: Fair     Standing balance support: Bilateral upper extremity supported Standing balance-Leahy Scale: Poor                      Cognition Arousal/Alertness: Awake/alert Behavior During Therapy: Anxious (labile) Overall Cognitive Status: No family/caregiver present to determine baseline cognitive functioning                      Exercises      General Comments General comments (skin integrity, edema, etc.): Assisted on/off bedpan at beginning of session. Pt adamantly refused to try BSC, but unable to explain why      Pertinent Vitals/Pain Pain Assessment: Faces Faces Pain Scale: Hurts whole lot Pain Location: Rt calf Pain Descriptors / Indicators: Grimacing;Other (Comment) (holding/rubbing her calf) Pain Intervention(s): Limited activity within patient's tolerance;Monitored during session;Repositioned    Home Living  Prior Function            PT Goals (current goals can now be found in the care plan section) Acute Rehab PT Goals Patient Stated Goal: go back to bed Time For Goal Achievement: 12/10/15 Progress towards PT goals: Progressing toward goals    Frequency    Min 2X/week      PT Plan Discharge plan needs to be updated    Co-evaluation              End of Session Equipment Utilized During Treatment: Gait belt Activity Tolerance: Patient limited by pain Patient left: in bed;with call bell/phone within reach;with bed alarm set     Time: 1431-1505 PT Time Calculation (min) (ACUTE ONLY): 34 min  Charges:  $Therapeutic Activity: 23-37 mins                    G Codes:      Wanda Griffin 12-28-15, 4:09 PM  Pager [phone]

## 2015-12-05 NOTE — Discharge Summary (Addendum)
Physician Discharge Summary  Wanda Griffin [id number] DOB: [birth year] DOA: 12/02/2015  PCP: [name], MD  Admit date: 12/02/2015 Discharge date: 12/06/2015  Time spent: 45 minutes  Recommendations for Outpatient Follow-up:  1. Neurologist Dr.Sethi in 1-32months 2. PCP Dr.Dwight Jimmye Norman in 1 week   Discharge Diagnoses:  Principal Problem:   CVA (cerebral vascular accident) ([place]) Active Problems:   Syncope and collapse   Tobacco abuse   Hyperlipidemia   COPD (chronic obstructive pulmonary disease) (H[facility])   Malnutrition of moderate degree   Right leg weakness   Right leg pain   Marijuana abuse   History of CVA (cerebrovascular accident)   Closed fracture of right femur ([facility])   S/P ORIF (open reduction internal fixation) fracture   Adjustment disorder with mixed anxiety and depressed mood   Discharge Condition: stable  Diet recommendation: heart healthy  Filed Weights   12/02/15 1256 12/02/15 2024  Weight: 43.1 kg (95 lb) 41 kg (90 lb 6.4 oz)    History of present illness:  60 year old female with history of anxiety, seizures, tobacco abuse, poor historian, presented to ED with falls, syncopal episode and right leg weakness. patient reported that she was going to the bathroom 10/30 when she had a syncopal episode , Per patient's family, they had noticed that she was progressively getting weaker in the right leg in the last 1 month and in the last 1 week, her right leg "just quit". Patient however did not seek any medical attention. 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 -continue Plavix, this was started in June after last  CVA -LDL-90-added statin and Hba1c 5.7 -Pt/OT/SLP consulted, SNF recommended -neurology consulted and recommended to continue plavix, dual antiplatelet therapy was considered however pt is allergic to aspirin (rash) -ECHO with normal EF and wall motion -plan for SNF for rehab -Neurology FU with Dr.Sethi in 1 month recommended  R thigh/Hip pain -H/o ORIF in June 2017 -Xray unremarkable except for post op changes -No pain with passive RoM of R hip  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 -has a guardian at [employer] use -counseled  Syncope and collapse - no events on tele, 2D ECHO normal - no history suggestive of seizure  COPD (chronic obstructive pulmonary disease) ([place]) - Stable continue dulera   Malnutrition of moderate degree - Patient reports poor appetite and losing weight, appears to be deconditioned  - Nutrition consulted, supplements advised  anxiety/depression Continue Seroquel, Xanax  History of seizures Continue Depakote  Discharge Exam: Vitals:   12/06/15 0500 12/06/15 0903  BP: (!) 132/56 (!) 104/55  Pulse: 74 88  Resp: 18 18  Temp: 98.8 F (37.1 C) 98.5 F (36.9 C)    General: AAOx3 Cardiovascular: S1S2/RRR Respiratory: CTAB  Discharge Instructions    Current Discharge Medication List    START taking these medications   Details  atorvastatin (LIPITOR) 20 MG tablet Take 1 tablet (20 mg total) by mouth daily at 6 PM.      CONTINUE these medications which have CHANGED   Details  ALPRAZolam (XANAX) 0.25 MG tablet Take 1 tablet (0.25 mg total) by mouth every 12 (twelve) hours as needed for anxiety. Qty: 10 tablet, Refills: 0  oxyCODONE-acetaminophen (PERCOCET/ROXICET) 5-325 MG tablet Take 1 tablet by mouth every 8 (eight) hours as needed for severe pain. Qty: 12 tablet, Refills: 0    zolpidem (AMBIEN) 10 MG tablet Take 0.5-1 tablets (5-10 mg total) by mouth at bedtime as needed for sleep. Qty: 10 tablet, Refills: 0       CONTINUE these medications which have NOT CHANGED   Details  acetaminophen (TYLENOL) 500 MG tablet Take 2,000 mg by mouth daily as needed for headache (or pain).     budesonide-formoterol (SYMBICORT) 160-4.5 MCG/ACT inhaler Inhale 2 puffs into the lungs 2 (two) times daily as needed.     clopidogrel (PLAVIX) 75 MG tablet Take 1 tablet (75 mg total) by mouth daily.    divalproex (DEPAKOTE) 500 MG DR tablet Take 500 mg by mouth 3 (three) times daily.    QUEtiapine (SEROQUEL) 25 MG tablet Take 1 tablet (25 mg total) by mouth 2 (two) times daily.      STOP taking these medications     enoxaparin (LOVENOX) 40 MG/0.4ML injection        Allergies  Allergen Reactions  . Ampicillin Hives  . Asa [Aspirin] Hives    Other reaction(s): Angioedema (ALLERGY/intolerance)  . Coconut Oil Swelling  . Codeine Itching    Morphine ok  . Colchicine Other (See Comments)    Reaction unknown by patient  . Penicillins Hives    Has patient had a PCN reaction causing immediate rash, facial/tongue/throat swelling, SOB or lightheadedness with hypotension: Yes Has patient had a PCN reaction causing severe rash involving mucus membranes or skin necrosis: No Has patient had a PCN reaction that required hospitalization No Has patient had a PCN reaction occurring within the last 10 years: No If all of the above answers are "NO", then may proceed with Cephalosporin use.   . Toradol [Ketorolac Tromethamine] Hives  . Sulfamethoxazole Rash      The results of significant diagnostics from this hospitalization (including imaging, microbiology, ancillary and laboratory) are listed below for reference.    Significant Diagnostic Studies: Dg Lumbar Spine Complete  Result Date: 12/02/2015 CLINICAL DATA:  Pain following recent fall EXAM: LUMBAR SPINE - COMPLETE 4+ VIEW COMPARISON:  None. FINDINGS: Frontal, lateral, spot lumbosacral lateral, and bilateral oblique views were obtained. There are 5 non-rib-bearing  lumbar type vertebral bodies. There is no fracture or spondylolisthesis. Disc spaces appear normal. There is no appreciable facet arthropathy. There is extensive aortoiliac atherosclerotic calcification. IMPRESSION: No fracture or spondylolisthesis. No appreciable disc space narrowing or facet arthropathy. There is extensive aortoiliac atherosclerosis. Electronically Signed   By: Lowella Grip III M.D.   On: 12/02/2015 15:21   Ct Head Wo Contrast  Addendum Date: 12/02/2015   ADDENDUM REPORT: 12/02/2015 17:16 ADDENDUM: Case discussed with clinical service; patient has ~2 months of right leg weakness. Since 08/02/15 subacute to chronic infarct has developed in the distal left ACA territory along the parasagittal left frontal lobe. This infarct is confluent with the patient's more chronic watershed infarct. Electronically Signed   By: Monte Fantasia M.D.   On: 12/02/2015 17:16   Result Date: 12/02/2015 CLINICAL DATA:  Right-sided weakness.  Fall. EXAM: CT HEAD WITHOUT CONTRAST TECHNIQUE: Contiguous axial images were obtained from the base of the skull through the vertex without intravenous contrast. COMPARISON:  08/02/2015 FINDINGS: Brain: Gliosis in the left temporal pole that is stable from prior. There is cortically based gliosis along the vertex of the left cerebral hemisphere, watershed infarct pattern in this patient with chronic  left ICA occlusion. Remote left caudate head infarct. No acute infarct, hemorrhage, hydrocephalus, or mass. Vascular: Asymmetric carotid siphon calcification on the left. Skull: Remote left occipital bone fracture. Remote bilateral craniotomy. Sinuses/Orbits: Negative Other: None IMPRESSION: 1. No acute finding. 2. 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 3. Left temporal pole gliosis, possibly posttraumatic given location and remote left occipital fracture. Electronically Signed: By: Monte Fantasia M.D. On: 12/02/2015 16:55   Mr Jodene Nam Head Wo  Contrast  Result Date: 12/02/2015 CLINICAL DATA:  Syncope and right leg weakness. EXAM: MRA HEAD WITHOUT CONTRAST TECHNIQUE: Angiographic images of the Circle of Willis were obtained using MRA technique without intravenous contrast. COMPARISON:  Brain MRI same day, CTA head 08/02/2015 FINDINGS: The examination is markedly degraded by patient motion. Intracranial internal carotid arteries: The right internal carotid artery is grossly normal, though obscured by motion. The left internal carotid artery remains occluded, as demonstrated on prior CTA. Anterior cerebral arteries: Normal. Middle cerebral arteries: The left MCA is diminutive compared to the right. The distal MCA distribution circulation is mildly decreased on the left. Posterior communicating arteries: Not visualized Posterior cerebral arteries: Normal. Basilar artery: Normal. Vertebral arteries: Right dominant Normal. Superior cerebellar arteries: Normal. Anterior inferior cerebellar arteries: Not clearly visualized, which is not uncommon. Posterior inferior cerebellar arteries: Normal on the left. Not clearly visualized on the right. IMPRESSION: 1. Despite efforts by the technologist and patient, motion artifact is present on today's examination and could not be eliminated. This reduces the sensitivity and specificity of the study. 2. Within the above limitation, there is unchanged occlusion of the left internal carotid artery with attenuation of the left MCA, and slightly decreased left distal MCA distribution circulation. 3. No other identified high-grade stenosis or occlusion. Electronically Signed   By: Ulyses Jarred M.D.   On: 12/02/2015 22:52   Mr Brain Wo Contrast  Result Date: 12/02/2015 CLINICAL DATA:  Right leg weakness EXAM: MRI HEAD WITHOUT CONTRAST TECHNIQUE: Multiplanar, multiecho pulse sequences of the brain and surrounding structures were obtained without intravenous contrast. COMPARISON:  Head CT 12/02/2015, brain MRI 08/01/2015  FINDINGS: Brain: There is multifocal diffusion restriction within the left frontal and anterior parietal lobes in a watershed distribution. The midline structures are normal. Areas of cystic change in the left watershed distribution and anterior left temporal lobe are unchanged. There is confluent hyperintense T2-weighted signal within the periventricular white matter, most often seen in the setting of chronic microvascular ischemia. No mass lesion or midline shift. No hydrocephalus or extra-axial fluid collection. Vascular: Chronic occlusion of the left internal carotid artery. No evidence of chronic microhemorrhage or amyloid angiopathy. Skull and upper cervical spine: Changes of remote bilateral craniotomies. Sinuses/Orbits: No fluid levels or advanced mucosal thickening. No mastoid effusion. Normal orbits. IMPRESSION: 1. Acute left frontoparietal watershed distribution infarct, superimposed on chronic infarct in this location. No acute hemorrhage or significant mass effect. 2. Findings of multiple remote infarcts and multiple areas of encephalomalacia including the left frontal and parietal lobes and both anterior temporal lobes. 3. Chronic occlusion of the left internal carotid artery. These results will be called to the ordering clinician or representative by the Radiologist Assistant, and communication documented in the PACS or zVision Dashboard. Electronically Signed   By: Ulyses Jarred M.D.   On: 12/02/2015 19:34   Dg Hip Unilat With Pelvis 2-3 Views Right  Result Date: 12/02/2015 CLINICAL DATA:  Right hip pain for 2 months. EXAM: DG HIP (WITH OR WITHOUT PELVIS) 2-3V RIGHT COMPARISON:  Hip radiograph 12/02/2015 FINDINGS: There is IIA right femoral antegrade intra medullary rod with interlocking component in the right femoral neck. Callus formation along the midshaft of the right femur is again noted. There is no acute hardware abnormality identified. The joint space of the right hip is unchanged. No  pelvic fracture. IMPRESSION: Unchanged appearance of right femoral intra medullary rod traversing side of prior femoral fracture. Electronically Signed   By: Ulyses Jarred M.D.   On: 12/02/2015 18:57   Dg Hip Unilat W Or Wo Pelvis 2-3 Views Right  Result Date: 12/02/2015 CLINICAL DATA:  Pain with recent falls EXAM: DG HIP (WITH OR WITHOUT PELVIS) 2-3V RIGHT COMPARISON:  July 28, 2015 FINDINGS: Frontal pelvis as well as frontal and lateral right hip images were obtained. There is evidence of previous surgical fixation for fracture of the mid right femur. There is a screw with its tip in the proximal right femoral head. There is evidence of an old healed fracture of the left ischium. No acute fracture or dislocation. Joint spaces appear normal. No erosive change. There is extensive iliac artery atherosclerosis. IMPRESSION: Evidence of old trauma. No acute fracture or dislocation. There is iliac artery atherosclerosis bilaterally. Joint spaces appear intact. Electronically Signed   By: Lowella Grip III M.D.   On: 12/02/2015 15:37   Dg Femur Min 2 Views Right  Result Date: 12/02/2015 CLINICAL DATA:  Pain following recent fall EXAM: RIGHT FEMUR 2 VIEWS COMPARISON:  July 28, 2015 FINDINGS: Frontal and lateral views were obtained. There is screw and rod fixation through a prior fracture of the mid right femur with bony remodeling at the fracture site. No acute fracture or dislocation. There is no knee joint effusion. There is mild osteoarthritic change in the right knee joint. IMPRESSION: Postoperative change with remodeling. No acute fracture or dislocation. Mild osteoarthritic change right knee joint. No knee joint effusion. Electronically Signed   By: Lowella Grip III M.D.   On: 12/02/2015 15:22    Microbiology: No results found for this or any previous visit (from the past 240 hour(s)).   Labs: Basic Metabolic Panel:  Recent Labs Lab 12/02/15 1638  NA 140  K 3.5  CL 103  CO2 25   GLUCOSE 85  BUN 7  CREATININE 0.66  CALCIUM 9.7   Liver Function Tests: No results for input(s): AST, ALT, ALKPHOS, BILITOT, PROT, ALBUMIN in the last 168 hours. No results for input(s): LIPASE, AMYLASE in the last 168 hours. No results for input(s): AMMONIA in the last 168 hours. CBC:  Recent Labs Lab 12/02/15 1638  WBC 7.0  NEUTROABS 3.5  HGB 14.6  HCT 44.3  MCV 89.9  PLT 205   Cardiac Enzymes: No results for input(s): CKTOTAL, CKMB, CKMBINDEX, TROPONINI in the last 168 hours. BNP: BNP (last 3 results) No results for input(s): BNP in the last 8760 hours.  ProBNP (last 3 results) No results for input(s): PROBNP in the last 8760 hours.  CBG: No results for input(s): GLUCAP in the last 168 hours.     SignedDomenic Polite MD.  [facility] 12/06/2015, 11:20 AM

## 2015-12-05 NOTE — Progress Notes (Signed)
Spoke with patient earlier this morning about participating in the [place] research study. Patient stated that she did not want to participate in study and became tearful. I explained the study to patient, questions were encouraged and answered. Patient states her husband will be here later and I can talk with him. I returned back to patients room after lunch patient was asleep. I again return with Jake Bathe, RN and we approached patient again about participating in study and speaking with husband. Patient states her husband was not coming today. I asked if I could call him and she stated, "no that cell phone service does not work where they live." I asked the patient if she would like to participate and did she understand that she had a 50-50 chance that a loop recorder could be implanted, and if she had any questions. Patient started crying and stated that she did not want to participate in the research study. I thanked the patient for allowing [place] to speak with her and explained that we didn't mean to upset her in any way.

## 2015-12-06 MED ORDER — ZOLPIDEM TARTRATE 10 MG PO TABS: 5.0000 mg | ORAL_TABLET | Freq: Every evening | ORAL | 0 refills | Status: AC | PRN

## 2015-12-06 MED ORDER — ALPRAZOLAM 0.25 MG PO TABS: 0.2500 mg | ORAL_TABLET | Freq: Two times a day (BID) | ORAL | 0 refills | Status: AC | PRN

## 2015-12-06 MED ORDER — OXYCODONE-ACETAMINOPHEN 5-325 MG PO TABS: 1.0000 | ORAL_TABLET | Freq: Three times a day (TID) | ORAL | 0 refills | Status: AC | PRN

## 2015-12-06 MED ORDER — ATORVASTATIN CALCIUM 10 MG PO TABS: 20.0000 mg | ORAL_TABLET | Freq: Every day | ORAL | Status: DC

## 2015-12-06 MED ORDER — ATORVASTATIN CALCIUM 20 MG PO TABS: 20.0000 mg | ORAL_TABLET | Freq: Every day | ORAL | Status: AC

## 2015-12-06 NOTE — Progress Notes (Signed)
Pt seen, no changes from DC summary 11/3 DC to SNF today

## 2015-12-06 NOTE — Progress Notes (Signed)
Patient will DC to: [facility] Anticipated DC date: 12/06/15 Family notified: Left voicemail for Guardian Transport by: Corey Harold   Per MD patient ready for DC to [facility]. RN, patient, patient's guardian, and facility notified of DC. Discharge Summary sent to facility. RN given number for report. DC packet on chart. Ambulance transport requested for patient.   CSW signing off.  Cedric Fishman, [place] Social Worker [phone]

## 2015-12-06 NOTE — Progress Notes (Signed)
Report called to [facility], RN; patient ready for discharge today; awaiting ambulance transport to facility; discharge instructions reviewed with patient and copy sent to SNF.

## 2015-12-09 DIAGNOSIS — R262 Difficulty in walking, not elsewhere classified: Secondary | ICD-10-CM | POA: Diagnosis not present

## 2015-12-09 DIAGNOSIS — I639 Cerebral infarction, unspecified: Secondary | ICD-10-CM | POA: Diagnosis not present

## 2015-12-09 DIAGNOSIS — J9611 Chronic respiratory failure with hypoxia: Secondary | ICD-10-CM | POA: Diagnosis not present

## 2015-12-09 DIAGNOSIS — E785 Hyperlipidemia, unspecified: Secondary | ICD-10-CM | POA: Diagnosis not present

## 2015-12-12 DIAGNOSIS — Z23 Encounter for immunization: Secondary | ICD-10-CM | POA: Diagnosis not present

## 2016-02-13 DIAGNOSIS — Z8781 Personal history of (healed) traumatic fracture: Secondary | ICD-10-CM | POA: Diagnosis not present

## 2016-02-13 DIAGNOSIS — G4700 Insomnia, unspecified: Secondary | ICD-10-CM | POA: Diagnosis not present

## 2016-02-13 DIAGNOSIS — F39 Unspecified mood [affective] disorder: Secondary | ICD-10-CM | POA: Diagnosis not present

## 2016-02-13 DIAGNOSIS — G40909 Epilepsy, unspecified, not intractable, without status epilepticus: Secondary | ICD-10-CM | POA: Diagnosis not present

## 2016-02-20 DIAGNOSIS — Z86011 Personal history of benign neoplasm of the brain: Secondary | ICD-10-CM | POA: Diagnosis not present

## 2016-02-20 DIAGNOSIS — I69351 Hemiplegia and hemiparesis following cerebral infarction affecting right dominant side: Secondary | ICD-10-CM | POA: Diagnosis not present

## 2016-02-20 DIAGNOSIS — I6521 Occlusion and stenosis of right carotid artery: Secondary | ICD-10-CM | POA: Diagnosis not present

## 2016-02-20 DIAGNOSIS — F17210 Nicotine dependence, cigarettes, uncomplicated: Secondary | ICD-10-CM | POA: Diagnosis not present

## 2016-02-20 DIAGNOSIS — Z681 Body mass index (BMI) 19 or less, adult: Secondary | ICD-10-CM | POA: Diagnosis not present

## 2016-02-20 DIAGNOSIS — G40909 Epilepsy, unspecified, not intractable, without status epilepticus: Secondary | ICD-10-CM | POA: Diagnosis not present

## 2016-02-20 DIAGNOSIS — I739 Peripheral vascular disease, unspecified: Secondary | ICD-10-CM | POA: Diagnosis not present

## 2016-02-20 DIAGNOSIS — F39 Unspecified mood [affective] disorder: Secondary | ICD-10-CM | POA: Diagnosis not present

## 2016-02-20 DIAGNOSIS — J449 Chronic obstructive pulmonary disease, unspecified: Secondary | ICD-10-CM | POA: Diagnosis not present

## 2016-02-20 DIAGNOSIS — Z7902 Long term (current) use of antithrombotics/antiplatelets: Secondary | ICD-10-CM | POA: Diagnosis not present

## 2016-02-23 DIAGNOSIS — I69351 Hemiplegia and hemiparesis following cerebral infarction affecting right dominant side: Secondary | ICD-10-CM | POA: Diagnosis not present

## 2016-02-23 DIAGNOSIS — I6521 Occlusion and stenosis of right carotid artery: Secondary | ICD-10-CM | POA: Diagnosis not present

## 2016-02-23 DIAGNOSIS — I739 Peripheral vascular disease, unspecified: Secondary | ICD-10-CM | POA: Diagnosis not present

## 2016-02-23 DIAGNOSIS — J449 Chronic obstructive pulmonary disease, unspecified: Secondary | ICD-10-CM | POA: Diagnosis not present

## 2016-02-23 DIAGNOSIS — G40909 Epilepsy, unspecified, not intractable, without status epilepticus: Secondary | ICD-10-CM | POA: Diagnosis not present

## 2016-02-23 DIAGNOSIS — F17210 Nicotine dependence, cigarettes, uncomplicated: Secondary | ICD-10-CM | POA: Diagnosis not present

## 2016-02-24 DIAGNOSIS — G40909 Epilepsy, unspecified, not intractable, without status epilepticus: Secondary | ICD-10-CM | POA: Diagnosis not present

## 2016-02-24 DIAGNOSIS — I69351 Hemiplegia and hemiparesis following cerebral infarction affecting right dominant side: Secondary | ICD-10-CM | POA: Diagnosis not present

## 2016-02-24 DIAGNOSIS — J449 Chronic obstructive pulmonary disease, unspecified: Secondary | ICD-10-CM | POA: Diagnosis not present

## 2016-02-24 DIAGNOSIS — I739 Peripheral vascular disease, unspecified: Secondary | ICD-10-CM | POA: Diagnosis not present

## 2016-02-24 DIAGNOSIS — F17210 Nicotine dependence, cigarettes, uncomplicated: Secondary | ICD-10-CM | POA: Diagnosis not present

## 2016-02-24 DIAGNOSIS — I6521 Occlusion and stenosis of right carotid artery: Secondary | ICD-10-CM | POA: Diagnosis not present

## 2016-02-27 DIAGNOSIS — I739 Peripheral vascular disease, unspecified: Secondary | ICD-10-CM | POA: Diagnosis not present

## 2016-02-27 DIAGNOSIS — F17210 Nicotine dependence, cigarettes, uncomplicated: Secondary | ICD-10-CM | POA: Diagnosis not present

## 2016-02-27 DIAGNOSIS — I69351 Hemiplegia and hemiparesis following cerebral infarction affecting right dominant side: Secondary | ICD-10-CM | POA: Diagnosis not present

## 2016-02-27 DIAGNOSIS — G40909 Epilepsy, unspecified, not intractable, without status epilepticus: Secondary | ICD-10-CM | POA: Diagnosis not present

## 2016-02-27 DIAGNOSIS — I6521 Occlusion and stenosis of right carotid artery: Secondary | ICD-10-CM | POA: Diagnosis not present

## 2016-02-27 DIAGNOSIS — J449 Chronic obstructive pulmonary disease, unspecified: Secondary | ICD-10-CM | POA: Diagnosis not present

## 2016-03-02 DIAGNOSIS — I739 Peripheral vascular disease, unspecified: Secondary | ICD-10-CM | POA: Diagnosis not present

## 2016-03-02 DIAGNOSIS — I6521 Occlusion and stenosis of right carotid artery: Secondary | ICD-10-CM | POA: Diagnosis not present

## 2016-03-02 DIAGNOSIS — J449 Chronic obstructive pulmonary disease, unspecified: Secondary | ICD-10-CM | POA: Diagnosis not present

## 2016-03-02 DIAGNOSIS — F17210 Nicotine dependence, cigarettes, uncomplicated: Secondary | ICD-10-CM | POA: Diagnosis not present

## 2016-03-02 DIAGNOSIS — I69351 Hemiplegia and hemiparesis following cerebral infarction affecting right dominant side: Secondary | ICD-10-CM | POA: Diagnosis not present

## 2016-03-02 DIAGNOSIS — G40909 Epilepsy, unspecified, not intractable, without status epilepticus: Secondary | ICD-10-CM | POA: Diagnosis not present

## 2016-03-04 DIAGNOSIS — I69351 Hemiplegia and hemiparesis following cerebral infarction affecting right dominant side: Secondary | ICD-10-CM | POA: Diagnosis not present

## 2016-03-04 DIAGNOSIS — I739 Peripheral vascular disease, unspecified: Secondary | ICD-10-CM | POA: Diagnosis not present

## 2016-03-04 DIAGNOSIS — F17210 Nicotine dependence, cigarettes, uncomplicated: Secondary | ICD-10-CM | POA: Diagnosis not present

## 2016-03-04 DIAGNOSIS — I6521 Occlusion and stenosis of right carotid artery: Secondary | ICD-10-CM | POA: Diagnosis not present

## 2016-03-04 DIAGNOSIS — G40909 Epilepsy, unspecified, not intractable, without status epilepticus: Secondary | ICD-10-CM | POA: Diagnosis not present

## 2016-03-04 DIAGNOSIS — J449 Chronic obstructive pulmonary disease, unspecified: Secondary | ICD-10-CM | POA: Diagnosis not present

## 2016-03-05 DIAGNOSIS — I739 Peripheral vascular disease, unspecified: Secondary | ICD-10-CM | POA: Diagnosis not present

## 2016-03-05 DIAGNOSIS — G40909 Epilepsy, unspecified, not intractable, without status epilepticus: Secondary | ICD-10-CM | POA: Diagnosis not present

## 2016-03-05 DIAGNOSIS — F17210 Nicotine dependence, cigarettes, uncomplicated: Secondary | ICD-10-CM | POA: Diagnosis not present

## 2016-03-05 DIAGNOSIS — I69351 Hemiplegia and hemiparesis following cerebral infarction affecting right dominant side: Secondary | ICD-10-CM | POA: Diagnosis not present

## 2016-03-05 DIAGNOSIS — J449 Chronic obstructive pulmonary disease, unspecified: Secondary | ICD-10-CM | POA: Diagnosis not present

## 2016-03-05 DIAGNOSIS — I6521 Occlusion and stenosis of right carotid artery: Secondary | ICD-10-CM | POA: Diagnosis not present

## 2016-03-10 DIAGNOSIS — G40909 Epilepsy, unspecified, not intractable, without status epilepticus: Secondary | ICD-10-CM | POA: Diagnosis not present

## 2016-03-10 DIAGNOSIS — I69351 Hemiplegia and hemiparesis following cerebral infarction affecting right dominant side: Secondary | ICD-10-CM | POA: Diagnosis not present

## 2016-03-10 DIAGNOSIS — I739 Peripheral vascular disease, unspecified: Secondary | ICD-10-CM | POA: Diagnosis not present

## 2016-03-10 DIAGNOSIS — F17210 Nicotine dependence, cigarettes, uncomplicated: Secondary | ICD-10-CM | POA: Diagnosis not present

## 2016-03-10 DIAGNOSIS — I6521 Occlusion and stenosis of right carotid artery: Secondary | ICD-10-CM | POA: Diagnosis not present

## 2016-03-10 DIAGNOSIS — J449 Chronic obstructive pulmonary disease, unspecified: Secondary | ICD-10-CM | POA: Diagnosis not present

## 2016-03-12 DIAGNOSIS — I6521 Occlusion and stenosis of right carotid artery: Secondary | ICD-10-CM | POA: Diagnosis not present

## 2016-03-12 DIAGNOSIS — F17210 Nicotine dependence, cigarettes, uncomplicated: Secondary | ICD-10-CM | POA: Diagnosis not present

## 2016-03-12 DIAGNOSIS — I739 Peripheral vascular disease, unspecified: Secondary | ICD-10-CM | POA: Diagnosis not present

## 2016-03-12 DIAGNOSIS — I69351 Hemiplegia and hemiparesis following cerebral infarction affecting right dominant side: Secondary | ICD-10-CM | POA: Diagnosis not present

## 2016-03-12 DIAGNOSIS — G40909 Epilepsy, unspecified, not intractable, without status epilepticus: Secondary | ICD-10-CM | POA: Diagnosis not present

## 2016-03-12 DIAGNOSIS — J449 Chronic obstructive pulmonary disease, unspecified: Secondary | ICD-10-CM | POA: Diagnosis not present

## 2016-03-15 DIAGNOSIS — G40909 Epilepsy, unspecified, not intractable, without status epilepticus: Secondary | ICD-10-CM | POA: Diagnosis not present

## 2016-03-15 DIAGNOSIS — F17210 Nicotine dependence, cigarettes, uncomplicated: Secondary | ICD-10-CM | POA: Diagnosis not present

## 2016-03-15 DIAGNOSIS — I739 Peripheral vascular disease, unspecified: Secondary | ICD-10-CM | POA: Diagnosis not present

## 2016-03-15 DIAGNOSIS — J449 Chronic obstructive pulmonary disease, unspecified: Secondary | ICD-10-CM | POA: Diagnosis not present

## 2016-03-15 DIAGNOSIS — I6521 Occlusion and stenosis of right carotid artery: Secondary | ICD-10-CM | POA: Diagnosis not present

## 2016-03-15 DIAGNOSIS — I69351 Hemiplegia and hemiparesis following cerebral infarction affecting right dominant side: Secondary | ICD-10-CM | POA: Diagnosis not present

## 2016-03-16 DIAGNOSIS — I69351 Hemiplegia and hemiparesis following cerebral infarction affecting right dominant side: Secondary | ICD-10-CM | POA: Diagnosis not present

## 2016-03-16 DIAGNOSIS — G40909 Epilepsy, unspecified, not intractable, without status epilepticus: Secondary | ICD-10-CM | POA: Diagnosis not present

## 2016-03-16 DIAGNOSIS — I6521 Occlusion and stenosis of right carotid artery: Secondary | ICD-10-CM | POA: Diagnosis not present

## 2016-03-16 DIAGNOSIS — J449 Chronic obstructive pulmonary disease, unspecified: Secondary | ICD-10-CM | POA: Diagnosis not present

## 2016-03-16 DIAGNOSIS — I739 Peripheral vascular disease, unspecified: Secondary | ICD-10-CM | POA: Diagnosis not present

## 2016-03-16 DIAGNOSIS — F17210 Nicotine dependence, cigarettes, uncomplicated: Secondary | ICD-10-CM | POA: Diagnosis not present

## 2016-03-18 DIAGNOSIS — I6521 Occlusion and stenosis of right carotid artery: Secondary | ICD-10-CM | POA: Diagnosis not present

## 2016-03-18 DIAGNOSIS — G40909 Epilepsy, unspecified, not intractable, without status epilepticus: Secondary | ICD-10-CM | POA: Diagnosis not present

## 2016-03-18 DIAGNOSIS — F17210 Nicotine dependence, cigarettes, uncomplicated: Secondary | ICD-10-CM | POA: Diagnosis not present

## 2016-03-18 DIAGNOSIS — I69351 Hemiplegia and hemiparesis following cerebral infarction affecting right dominant side: Secondary | ICD-10-CM | POA: Diagnosis not present

## 2016-03-18 DIAGNOSIS — I739 Peripheral vascular disease, unspecified: Secondary | ICD-10-CM | POA: Diagnosis not present

## 2016-03-18 DIAGNOSIS — J449 Chronic obstructive pulmonary disease, unspecified: Secondary | ICD-10-CM | POA: Diagnosis not present

## 2016-05-26 DIAGNOSIS — S72361D Displaced segmental fracture of shaft of right femur, subsequent encounter for closed fracture with routine healing: Secondary | ICD-10-CM | POA: Diagnosis not present

## 2016-12-20 DIAGNOSIS — F17200 Nicotine dependence, unspecified, uncomplicated: Secondary | ICD-10-CM | POA: Diagnosis not present

## 2016-12-20 DIAGNOSIS — E785 Hyperlipidemia, unspecified: Secondary | ICD-10-CM | POA: Diagnosis not present

## 2016-12-20 DIAGNOSIS — Z720 Tobacco use: Secondary | ICD-10-CM | POA: Diagnosis not present

## 2016-12-20 DIAGNOSIS — F17210 Nicotine dependence, cigarettes, uncomplicated: Secondary | ICD-10-CM | POA: Diagnosis not present

## 2016-12-20 DIAGNOSIS — F489 Nonpsychotic mental disorder, unspecified: Secondary | ICD-10-CM | POA: Diagnosis not present

## 2016-12-20 DIAGNOSIS — J449 Chronic obstructive pulmonary disease, unspecified: Secondary | ICD-10-CM | POA: Diagnosis not present

## 2016-12-20 DIAGNOSIS — R2681 Unsteadiness on feet: Secondary | ICD-10-CM | POA: Diagnosis not present

## 2016-12-20 DIAGNOSIS — Z8673 Personal history of transient ischemic attack (TIA), and cerebral infarction without residual deficits: Secondary | ICD-10-CM | POA: Diagnosis not present

## 2016-12-20 DIAGNOSIS — Z87891 Personal history of nicotine dependence: Secondary | ICD-10-CM | POA: Diagnosis not present

## 2016-12-29 DIAGNOSIS — F0390 Unspecified dementia without behavioral disturbance: Secondary | ICD-10-CM | POA: Diagnosis not present

## 2016-12-29 DIAGNOSIS — R4182 Altered mental status, unspecified: Secondary | ICD-10-CM | POA: Diagnosis not present

## 2016-12-29 DIAGNOSIS — R531 Weakness: Secondary | ICD-10-CM | POA: Diagnosis not present

## 2016-12-29 DIAGNOSIS — Z8673 Personal history of transient ischemic attack (TIA), and cerebral infarction without residual deficits: Secondary | ICD-10-CM | POA: Diagnosis not present

## 2016-12-29 DIAGNOSIS — F329 Major depressive disorder, single episode, unspecified: Secondary | ICD-10-CM | POA: Diagnosis not present

## 2016-12-30 DIAGNOSIS — M6281 Muscle weakness (generalized): Secondary | ICD-10-CM | POA: Diagnosis not present

## 2016-12-30 DIAGNOSIS — I10 Essential (primary) hypertension: Secondary | ICD-10-CM | POA: Diagnosis not present

## 2016-12-31 DIAGNOSIS — J449 Chronic obstructive pulmonary disease, unspecified: Secondary | ICD-10-CM | POA: Diagnosis not present

## 2016-12-31 DIAGNOSIS — I6930 Unspecified sequelae of cerebral infarction: Secondary | ICD-10-CM | POA: Diagnosis not present

## 2016-12-31 DIAGNOSIS — I10 Essential (primary) hypertension: Secondary | ICD-10-CM | POA: Diagnosis not present

## 2016-12-31 DIAGNOSIS — E46 Unspecified protein-calorie malnutrition: Secondary | ICD-10-CM | POA: Diagnosis not present

## 2016-12-31 DIAGNOSIS — M6281 Muscle weakness (generalized): Secondary | ICD-10-CM | POA: Diagnosis not present

## 2016-12-31 DIAGNOSIS — E785 Hyperlipidemia, unspecified: Secondary | ICD-10-CM | POA: Diagnosis not present

## 2016-12-31 IMAGING — DX DG HIP (WITH OR WITHOUT PELVIS) 2-3V*R*
3 series · 3 of 3 positions shown · non-contrast
Comparison: July 28, 2015

CLINICAL DATA: Pain with recent falls

EXAM:
DG HIP (WITH OR WITHOUT PELVIS) 2-3V RIGHT

[pelvis ap]
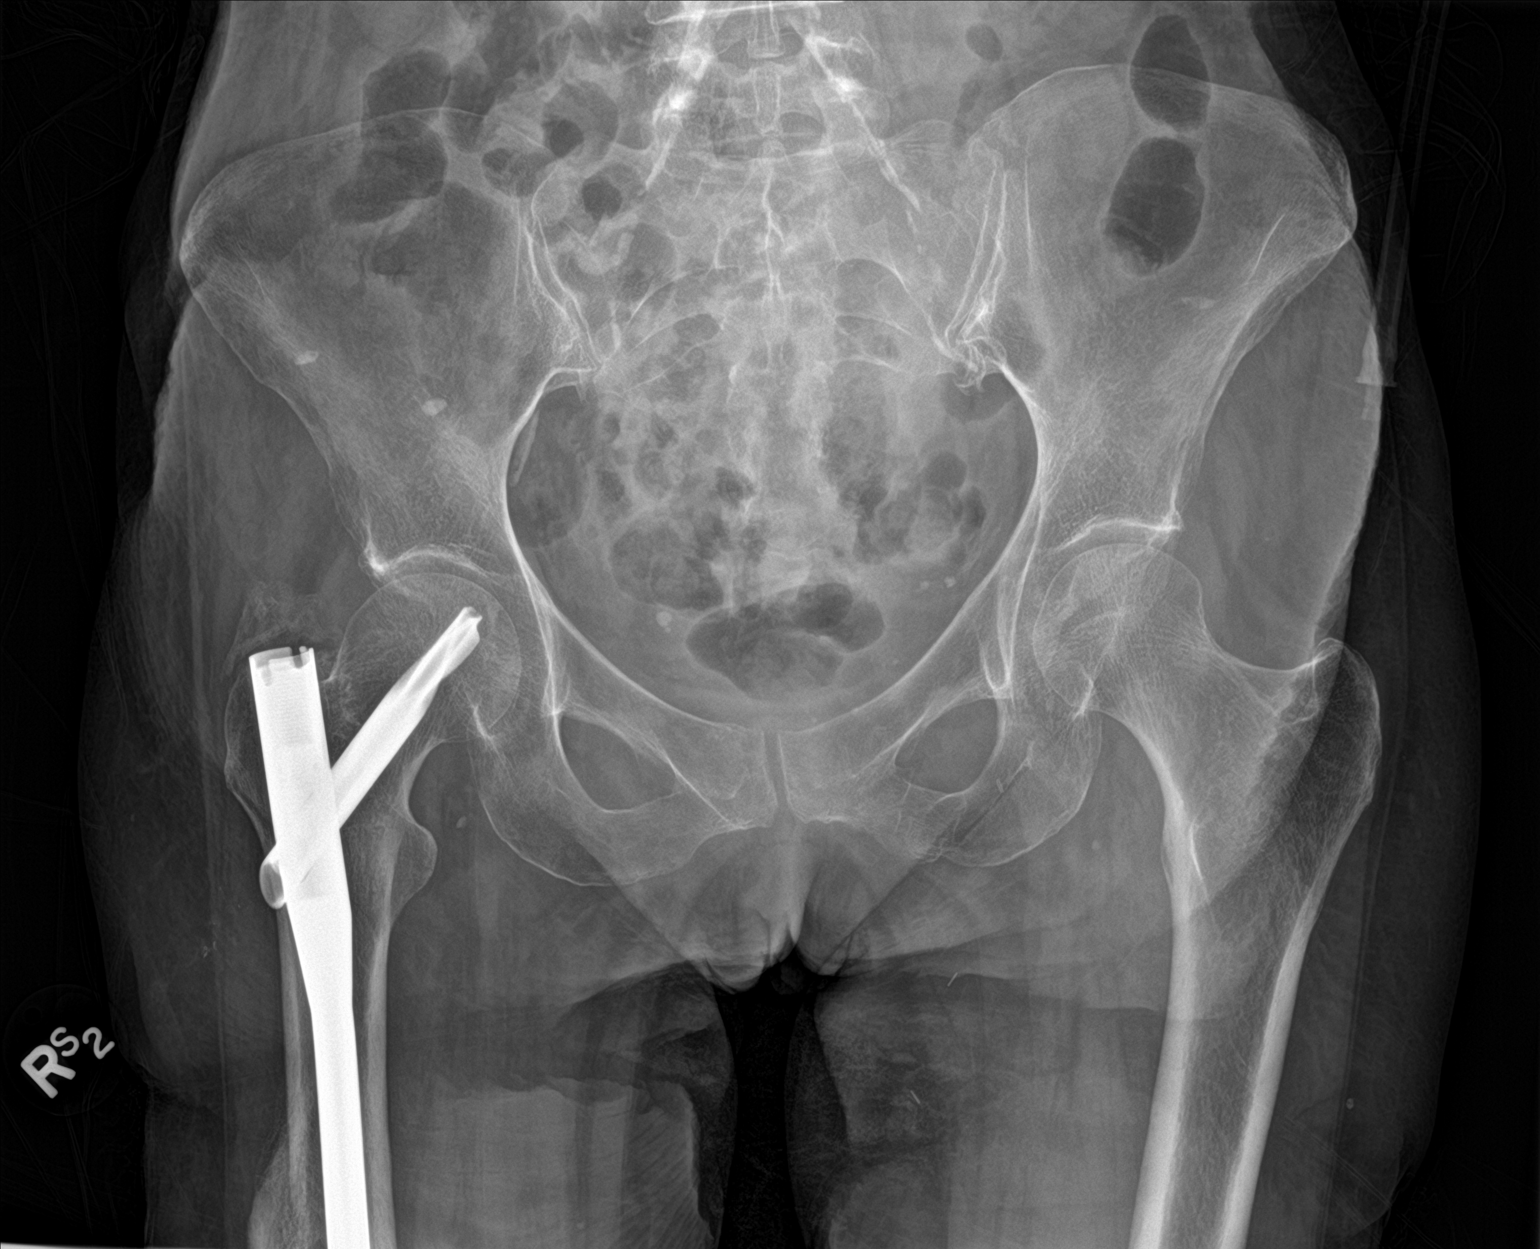

[hip ap]
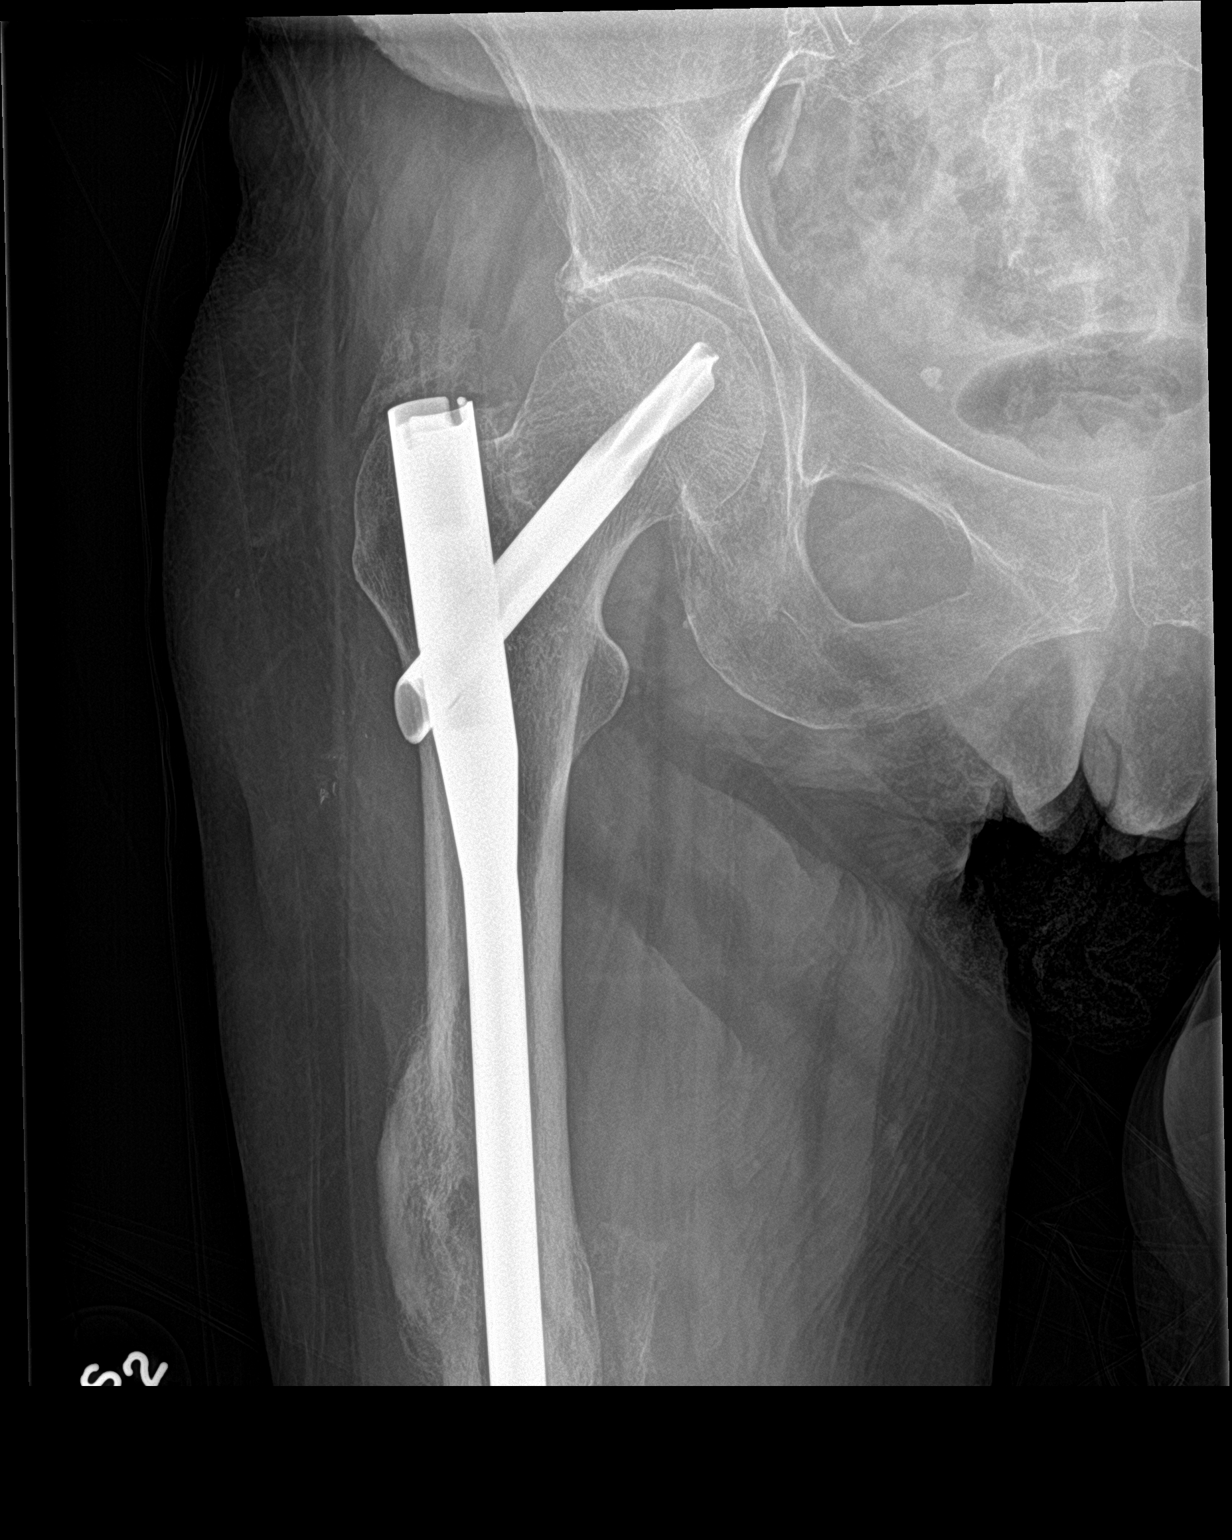

[hip x-table]
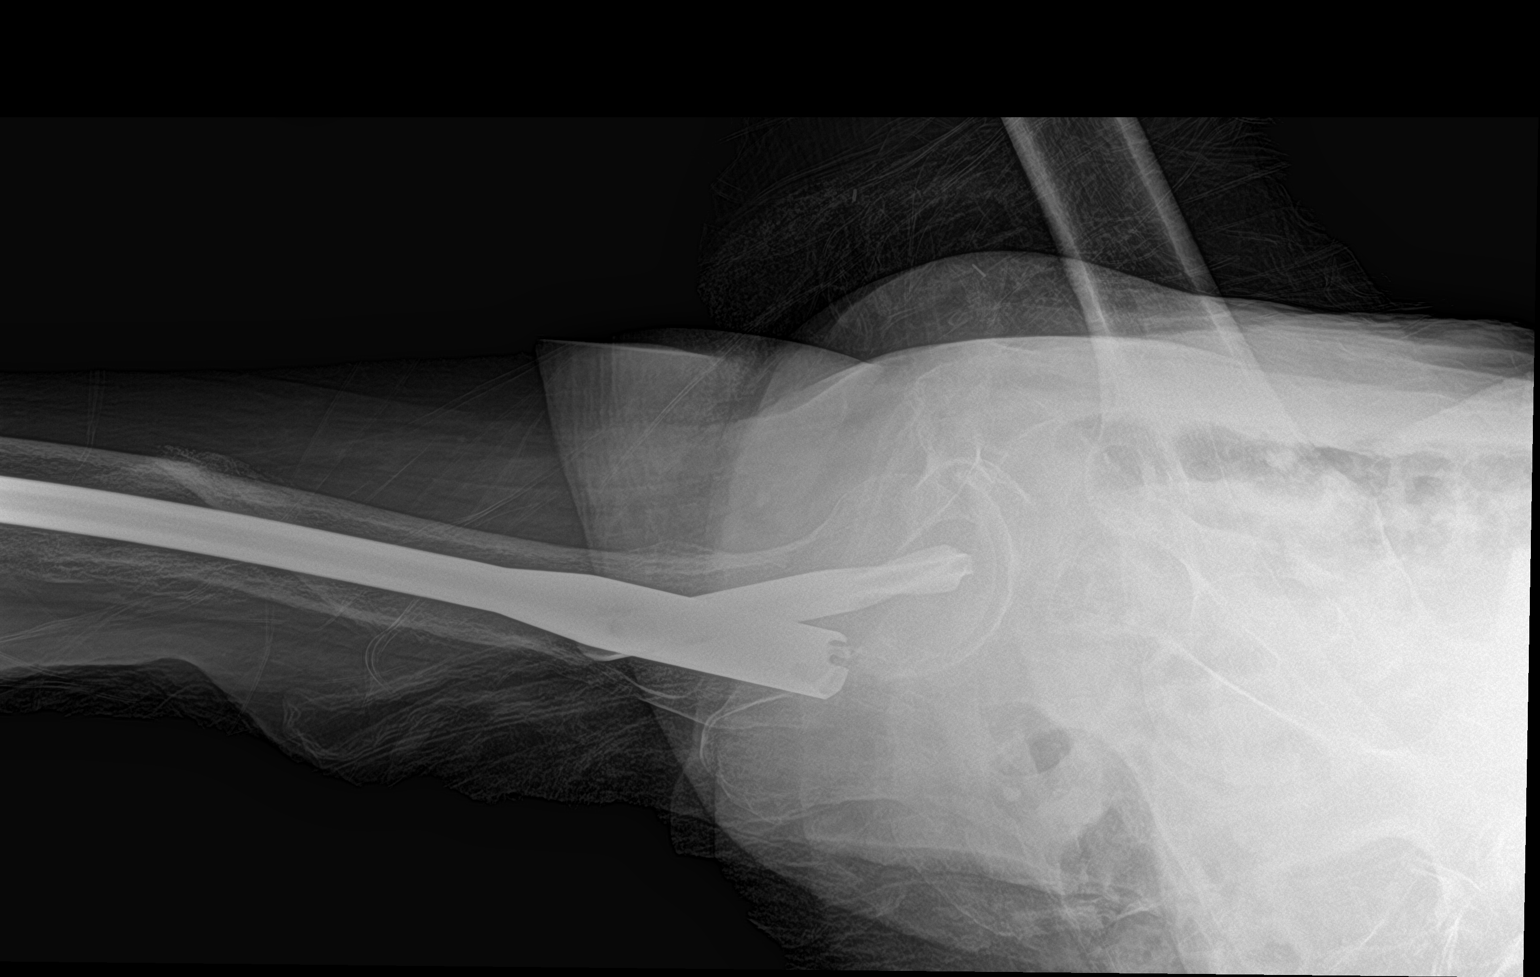

[3 of 3 positions shown; findings below may reference images not displayed]

FINDINGS: Frontal pelvis as well as frontal and lateral right hip images were
obtained. There is evidence of previous surgical fixation for
fracture of the mid right femur. There is a screw with its tip in
the proximal right femoral head. There is evidence of an old healed
fracture of the left ischium. No acute fracture or dislocation.
Joint spaces appear normal. No erosive change. There is extensive
iliac artery atherosclerosis.
IMPRESSION: Evidence of old trauma. No acute fracture or dislocation. There is
iliac artery atherosclerosis bilaterally. Joint spaces appear
intact.

## 2016-12-31 IMAGING — CR DG HIP (WITH OR WITHOUT PELVIS) 2-3V*R*
3 series · 3 of 3 positions shown · non-contrast
Comparison: Hip radiograph 12/02/2015

CLINICAL DATA: Right hip pain for 2 months.

EXAM:
DG HIP (WITH OR WITHOUT PELVIS) 2-3V RIGHT

[pelvis ap]
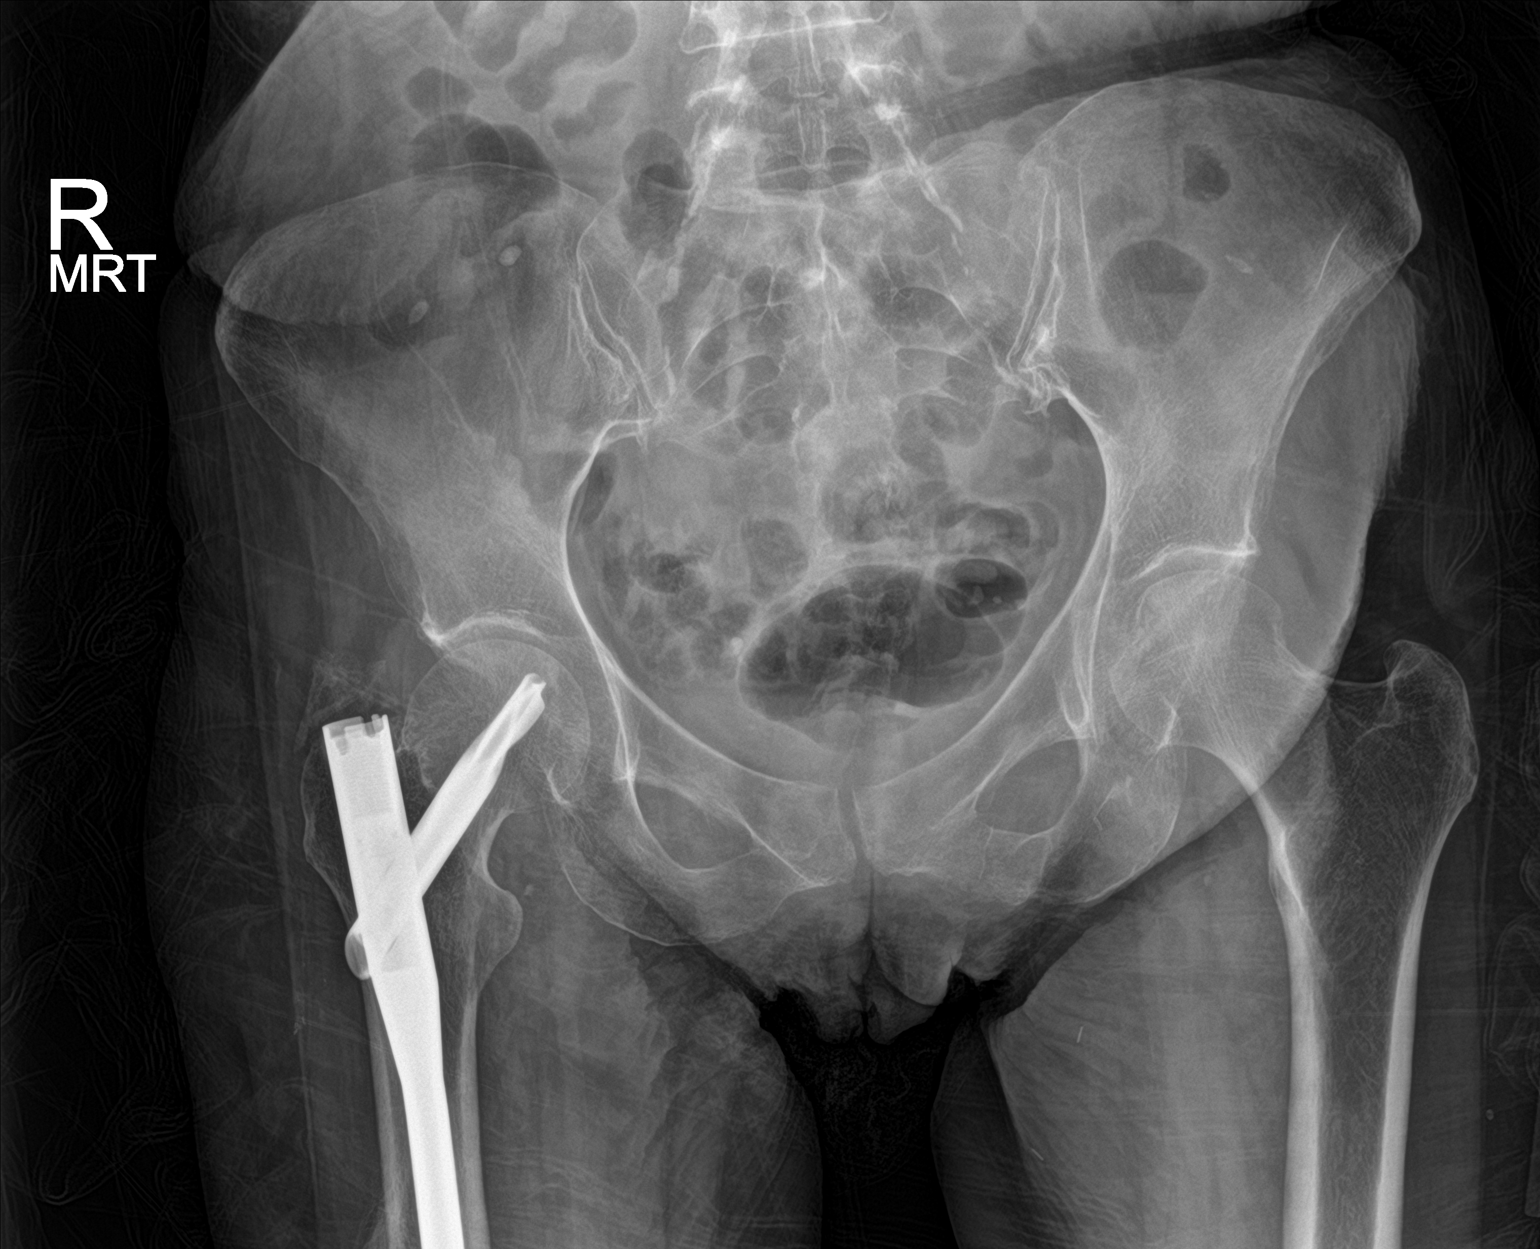

[hip ap]
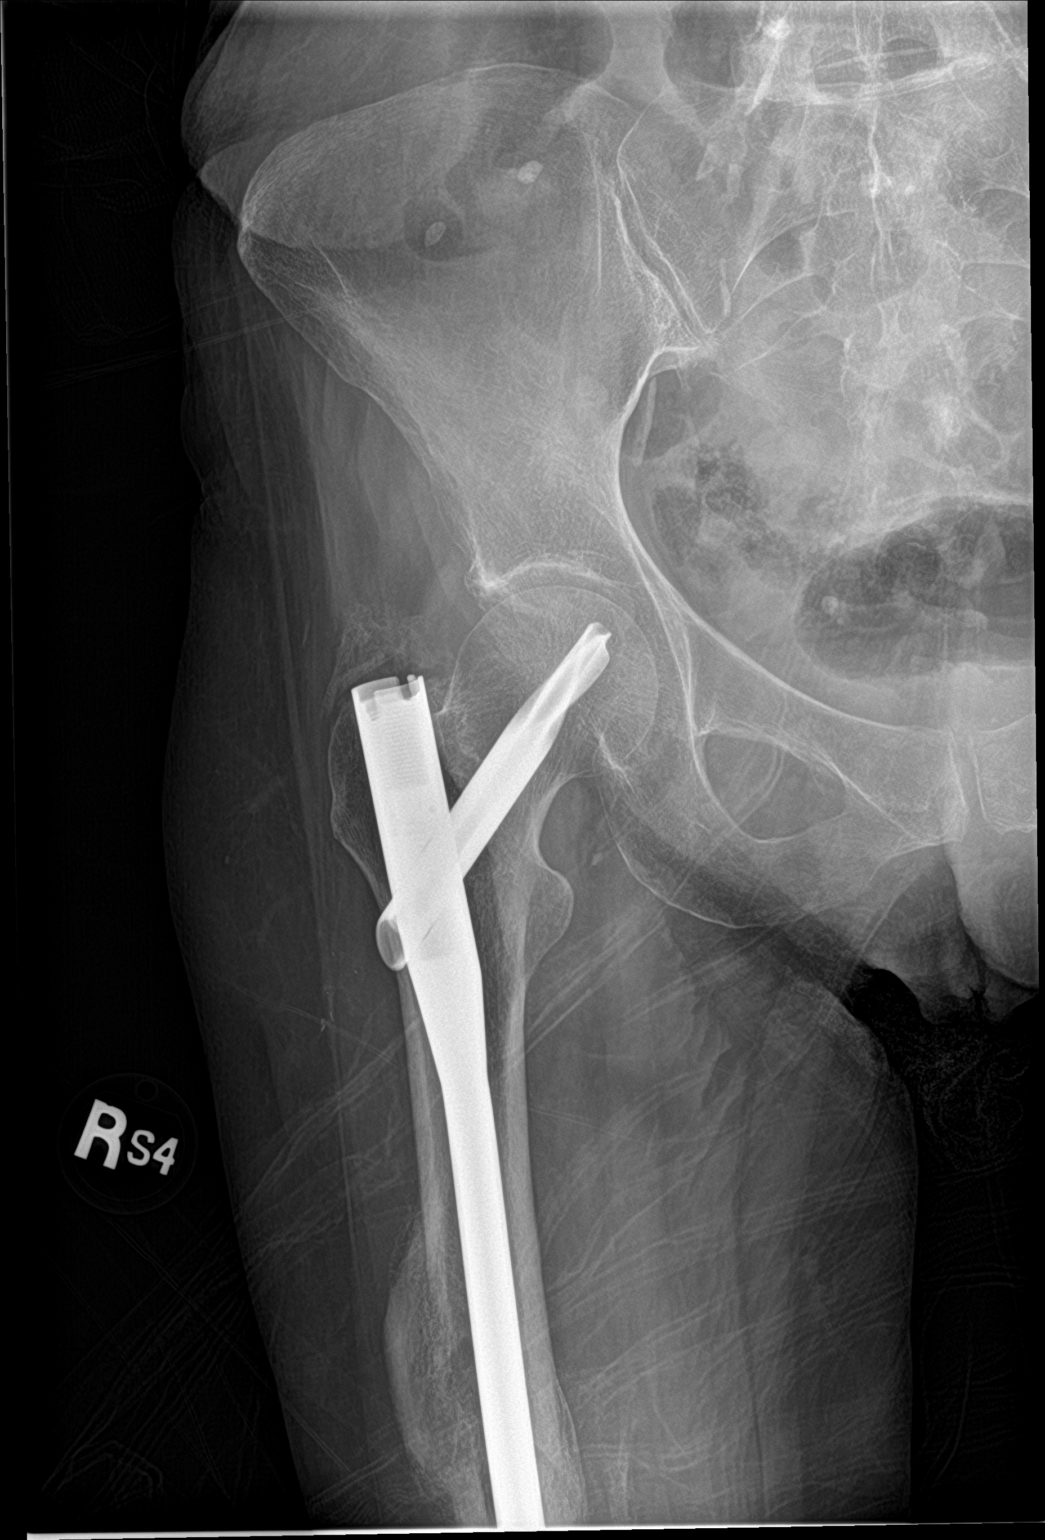

[hip lat]
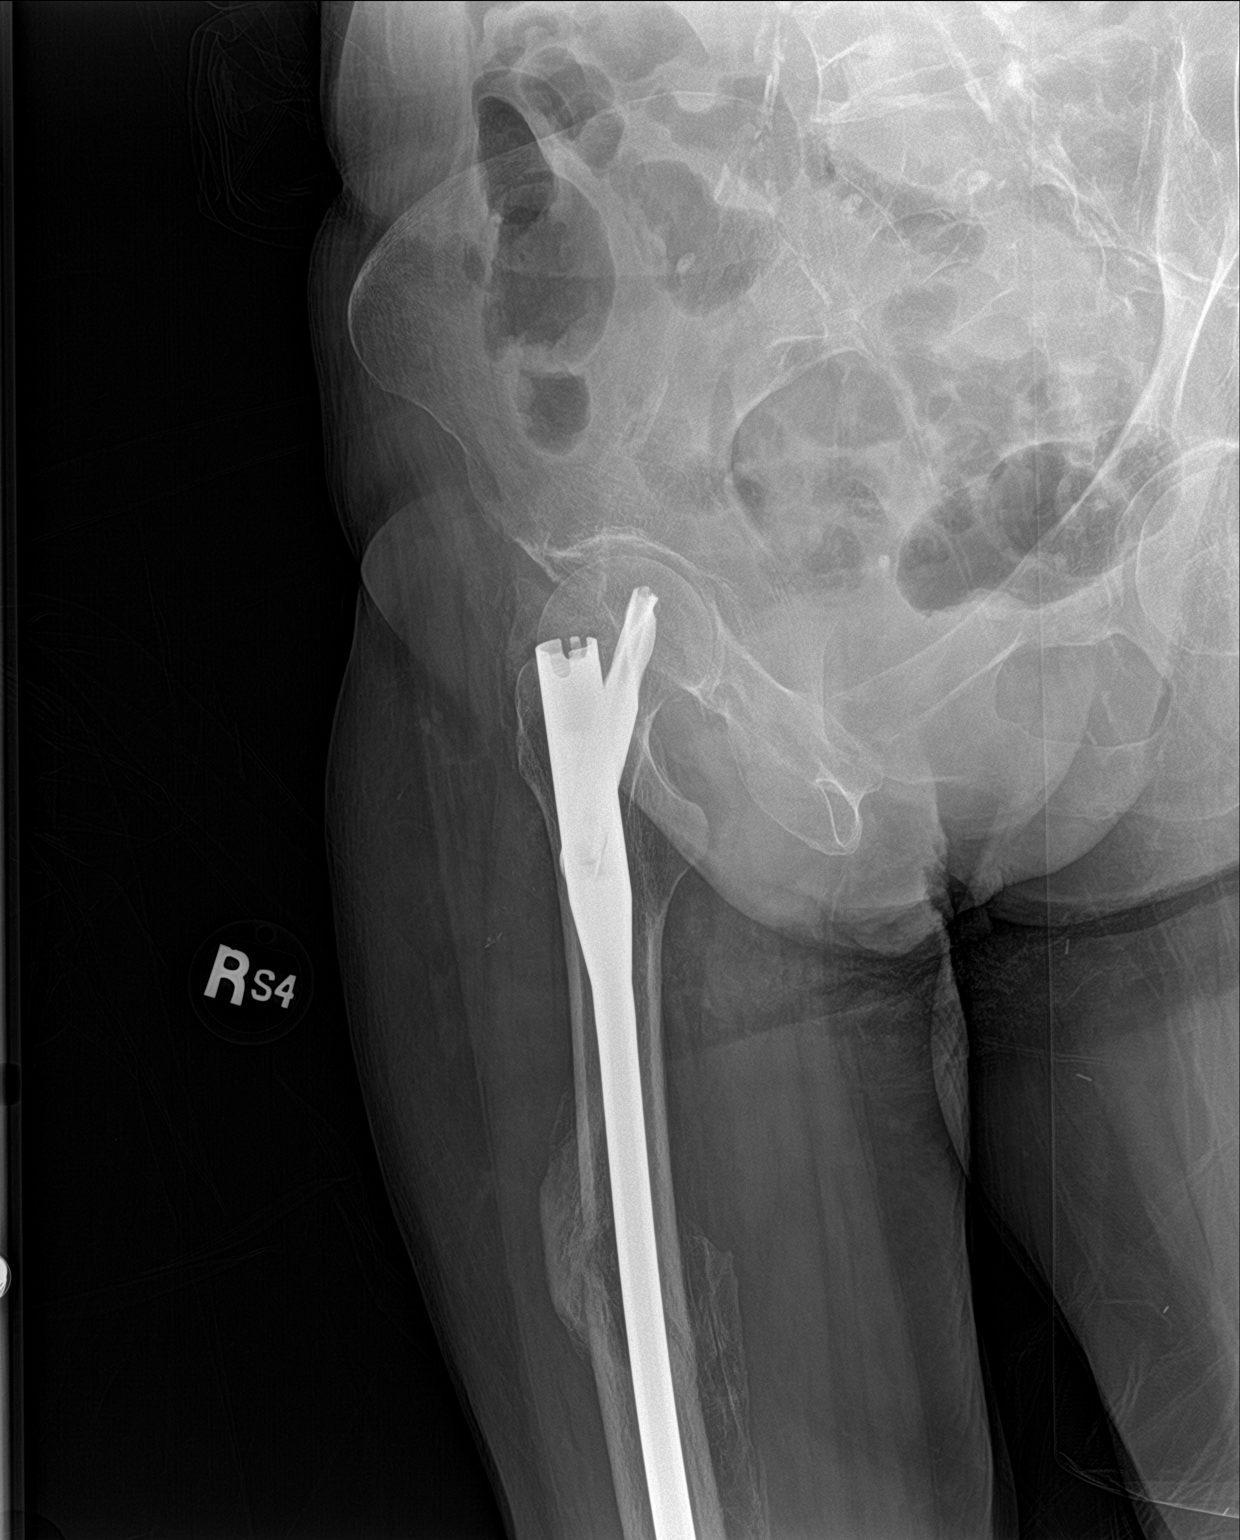

[3 of 3 positions shown; findings below may reference images not displayed]

FINDINGS: There is IIA right femoral antegrade intra medullary rod with
interlocking component in the right femoral neck. Callus formation
along the midshaft of the right femur is again noted. There is no
acute hardware abnormality identified. The joint space of the right
hip is unchanged. No pelvic fracture.
IMPRESSION: Unchanged appearance of right femoral intra medullary rod traversing
side of prior femoral fracture.

## 2016-12-31 IMAGING — CT CT HEAD W/O CM
4 series · 17 of 47 positions shown, 19 images · non-contrast
Comparison: 08/02/2015

ADDENDUM:
Case discussed with clinical service; patient has 
2 months of
right leg weakness. Since 08/02/15 subacute to chronic infarct has
developed in the distal left ACA territory along the parasagittal
left frontal lobe. This infarct is confluent with the patient's more
chronic watershed infarct.
CLINICAL DATA: Right-sided weakness.  Fall.

EXAM:
CT HEAD WITHOUT CONTRAST
TECHNIQUE: Contiguous axial images were obtained from the base of the skull
through the vertex without intravenous contrast.

[Series 2: head without · axial · non-contrast · 0.39mm/px · z∈[-80,+40]mm · 7 of 34 slices shown, 9 images]
[im 5/34  brain]
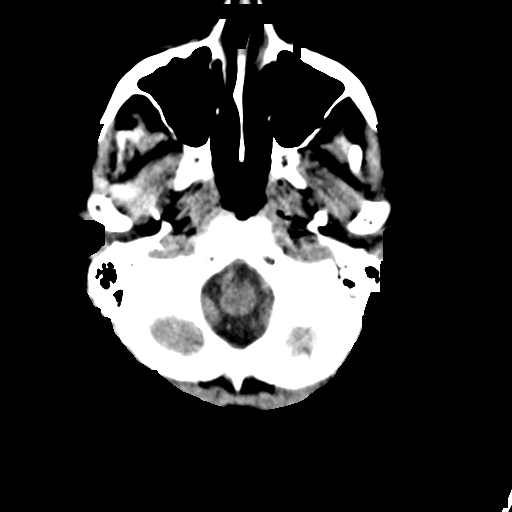
[im 5/34  bone]
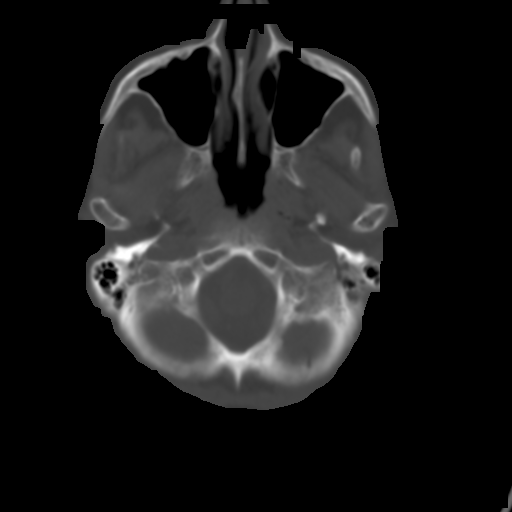
[im 9/34  brain]
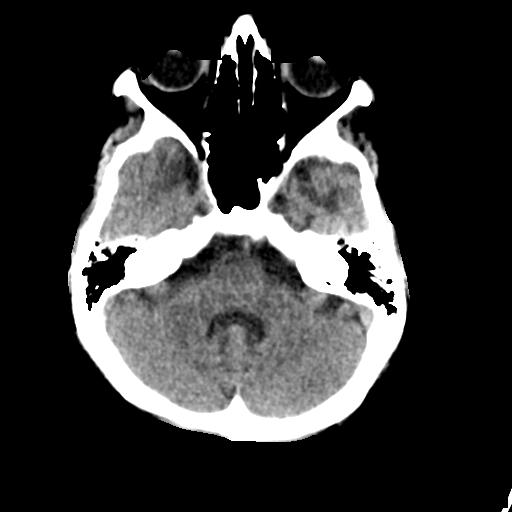
[im 13/34  brain]
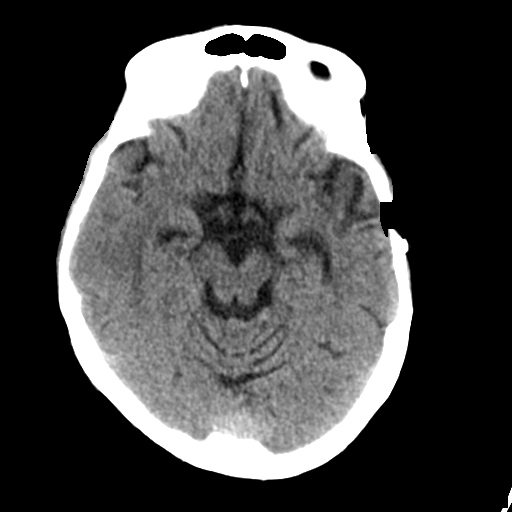
[im 17/34  brain]
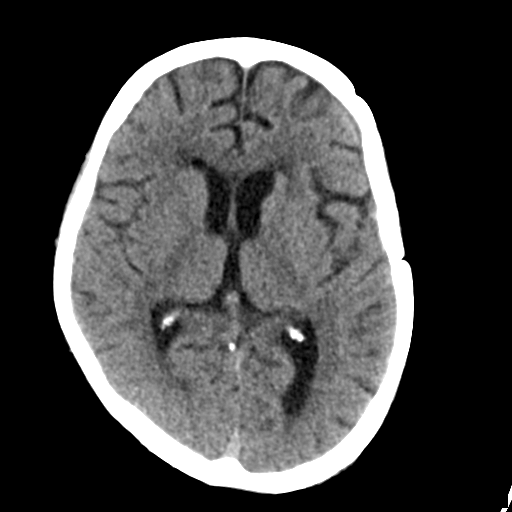
[im 21/34  brain]
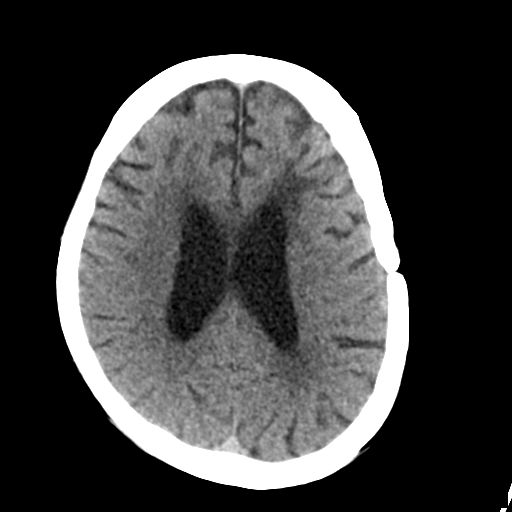
[im 21/34  bone]
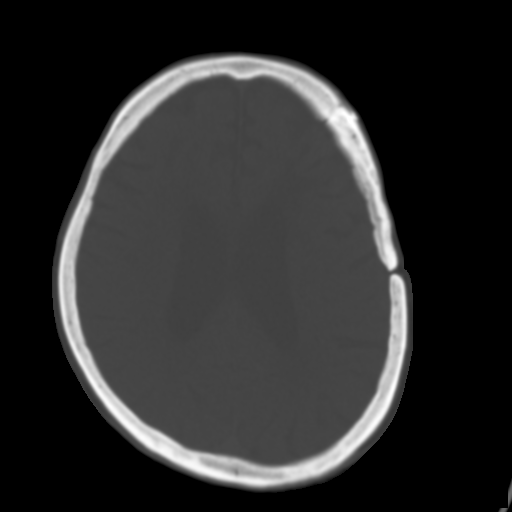
[im 25/34  brain]
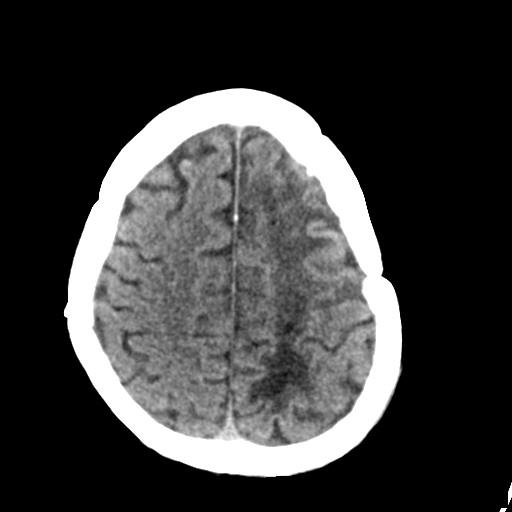
[im 29/34  brain]
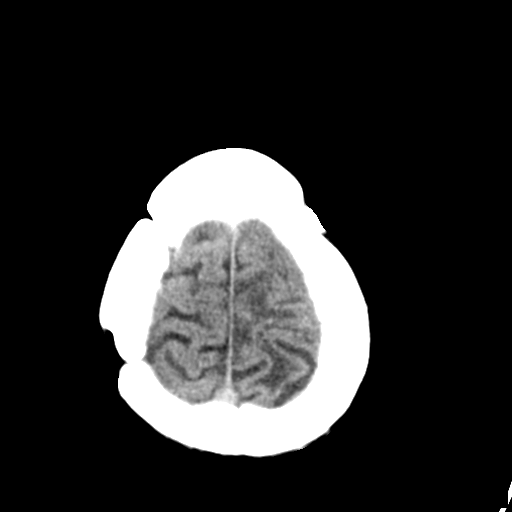

[Series 3: head bone · axial · 0.39mm/px · z∈[-84,-26]mm · 4 of 85 slices shown]
[im 9/85  bone]
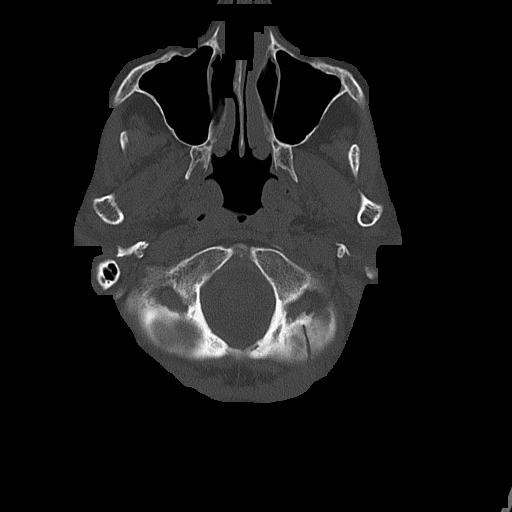
[im 17/85  bone]
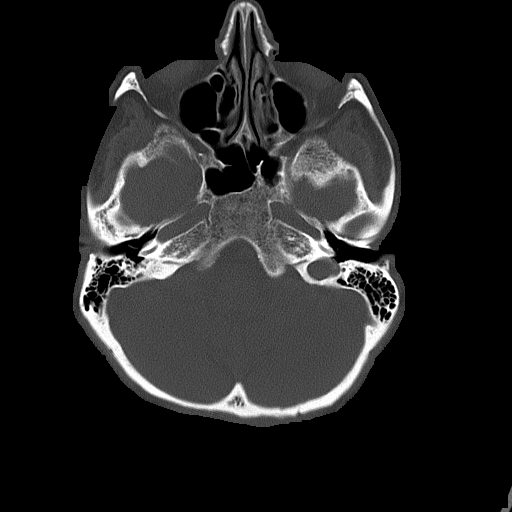
[im 26/85  bone]
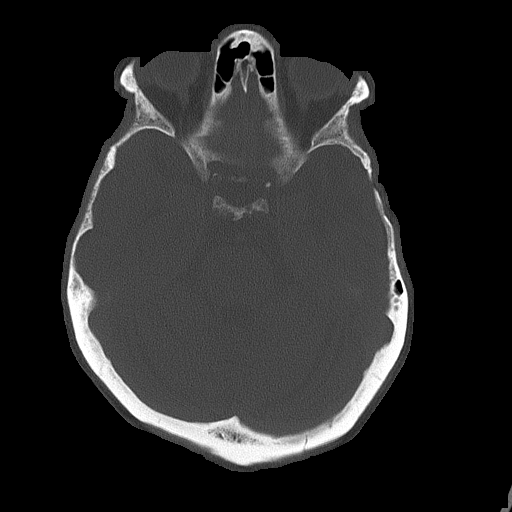
[im 38/85  bone]
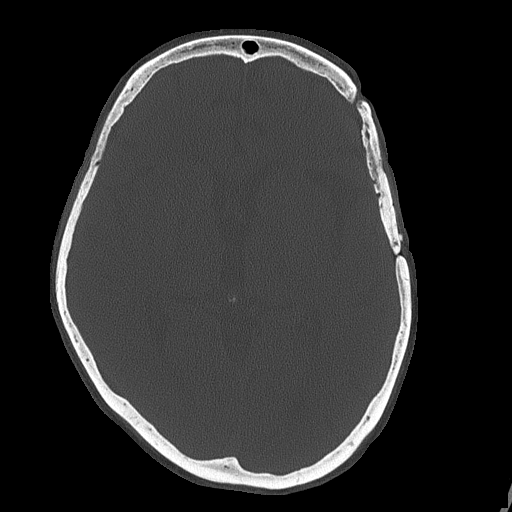

[Series 4: head without cor · coronal · non-contrast · 0.31mm/px · 3 of 65 slices shown]
[im 22/65  brain]
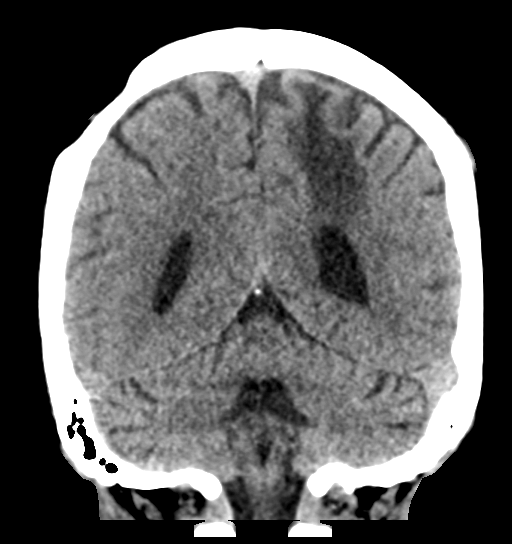
[im 29/65  brain]
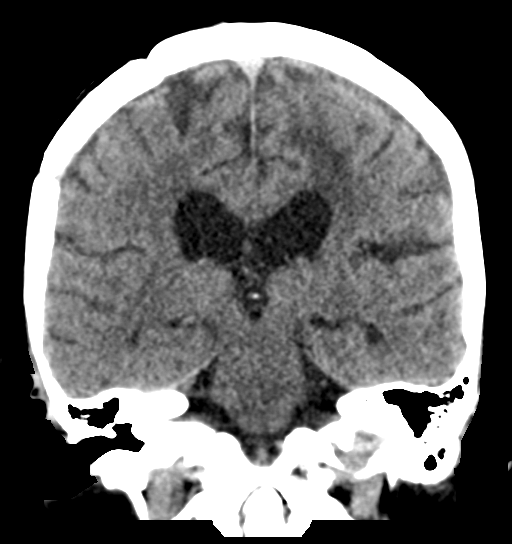
[im 36/65  brain]
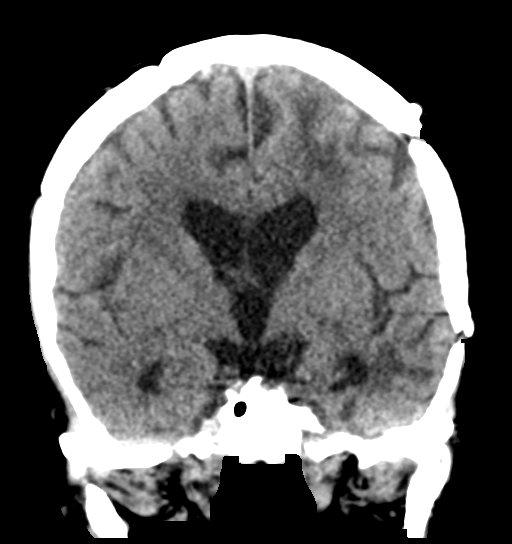

[Series 5: head without sag · sagittal · non-contrast · 0.33mm/px · 3 of 52 slices shown]
[im 18/52  brain]
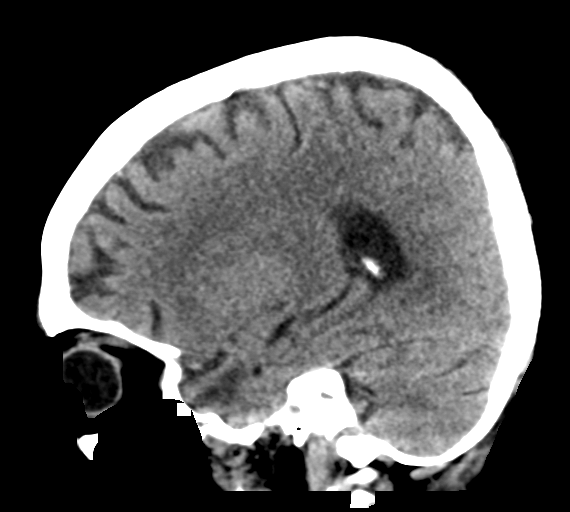
[im 26/52  brain]
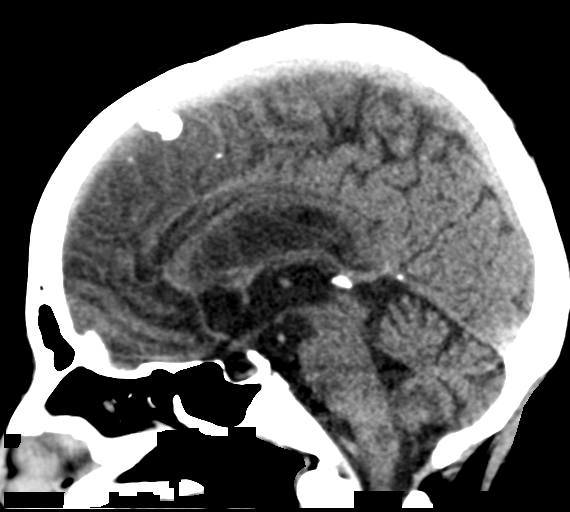
[im 35/52  brain]
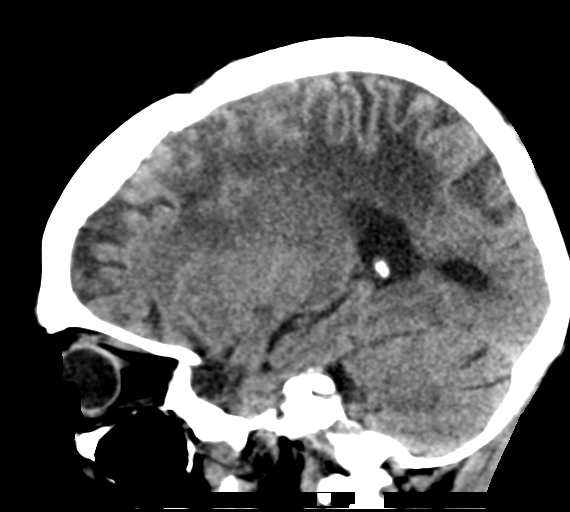

[17 of 47 positions shown; findings below may reference images not displayed]

FINDINGS: Brain: Gliosis in the left temporal pole that is stable from prior.
There is cortically based gliosis along the vertex of the left
cerebral hemisphere, watershed infarct pattern in this patient with
chronic left ICA occlusion. Remote left caudate head infarct.

No acute infarct, hemorrhage, hydrocephalus, or mass.

Vascular: Asymmetric carotid siphon calcification on the left.

Skull: Remote left occipital bone fracture. Remote bilateral
craniotomy.

Sinuses/Orbits: Negative

Other: None
IMPRESSION: 1. No acute finding.
2. Chronic left ICA occlusion and watershed left cerebral infarcts.
3. Left temporal pole gliosis, possibly posttraumatic given location
and remote left occipital fracture.

## 2017-01-01 DIAGNOSIS — G8191 Hemiplegia, unspecified affecting right dominant side: Secondary | ICD-10-CM | POA: Diagnosis not present

## 2017-01-01 DIAGNOSIS — R1310 Dysphagia, unspecified: Secondary | ICD-10-CM | POA: Diagnosis not present

## 2017-01-01 DIAGNOSIS — R488 Other symbolic dysfunctions: Secondary | ICD-10-CM | POA: Diagnosis not present

## 2017-01-01 DIAGNOSIS — R262 Difficulty in walking, not elsewhere classified: Secondary | ICD-10-CM | POA: Diagnosis not present

## 2017-01-01 DIAGNOSIS — M6281 Muscle weakness (generalized): Secondary | ICD-10-CM | POA: Diagnosis not present

## 2017-01-01 DIAGNOSIS — I69351 Hemiplegia and hemiparesis following cerebral infarction affecting right dominant side: Secondary | ICD-10-CM | POA: Diagnosis not present

## 2017-01-03 DIAGNOSIS — R262 Difficulty in walking, not elsewhere classified: Secondary | ICD-10-CM | POA: Diagnosis not present

## 2017-01-03 DIAGNOSIS — G8191 Hemiplegia, unspecified affecting right dominant side: Secondary | ICD-10-CM | POA: Diagnosis not present

## 2017-01-03 DIAGNOSIS — R1310 Dysphagia, unspecified: Secondary | ICD-10-CM | POA: Diagnosis not present

## 2017-01-03 DIAGNOSIS — R488 Other symbolic dysfunctions: Secondary | ICD-10-CM | POA: Diagnosis not present

## 2017-01-03 DIAGNOSIS — M6281 Muscle weakness (generalized): Secondary | ICD-10-CM | POA: Diagnosis not present

## 2017-01-03 DIAGNOSIS — I69351 Hemiplegia and hemiparesis following cerebral infarction affecting right dominant side: Secondary | ICD-10-CM | POA: Diagnosis not present

## 2017-01-04 DIAGNOSIS — E46 Unspecified protein-calorie malnutrition: Secondary | ICD-10-CM | POA: Diagnosis not present

## 2017-01-04 DIAGNOSIS — R488 Other symbolic dysfunctions: Secondary | ICD-10-CM | POA: Diagnosis not present

## 2017-01-04 DIAGNOSIS — R262 Difficulty in walking, not elsewhere classified: Secondary | ICD-10-CM | POA: Diagnosis not present

## 2017-01-04 DIAGNOSIS — G40909 Epilepsy, unspecified, not intractable, without status epilepticus: Secondary | ICD-10-CM | POA: Diagnosis not present

## 2017-01-04 DIAGNOSIS — J449 Chronic obstructive pulmonary disease, unspecified: Secondary | ICD-10-CM | POA: Diagnosis not present

## 2017-01-04 DIAGNOSIS — R1310 Dysphagia, unspecified: Secondary | ICD-10-CM | POA: Diagnosis not present

## 2017-01-04 DIAGNOSIS — M6281 Muscle weakness (generalized): Secondary | ICD-10-CM | POA: Diagnosis not present

## 2017-01-04 DIAGNOSIS — I69351 Hemiplegia and hemiparesis following cerebral infarction affecting right dominant side: Secondary | ICD-10-CM | POA: Diagnosis not present

## 2017-01-04 DIAGNOSIS — G8191 Hemiplegia, unspecified affecting right dominant side: Secondary | ICD-10-CM | POA: Diagnosis not present

## 2017-01-05 DIAGNOSIS — G8191 Hemiplegia, unspecified affecting right dominant side: Secondary | ICD-10-CM | POA: Diagnosis not present

## 2017-01-05 DIAGNOSIS — Z111 Encounter for screening for respiratory tuberculosis: Secondary | ICD-10-CM | POA: Diagnosis not present

## 2017-01-05 DIAGNOSIS — R488 Other symbolic dysfunctions: Secondary | ICD-10-CM | POA: Diagnosis not present

## 2017-01-05 DIAGNOSIS — I69351 Hemiplegia and hemiparesis following cerebral infarction affecting right dominant side: Secondary | ICD-10-CM | POA: Diagnosis not present

## 2017-01-05 DIAGNOSIS — M6281 Muscle weakness (generalized): Secondary | ICD-10-CM | POA: Diagnosis not present

## 2017-01-05 DIAGNOSIS — R1310 Dysphagia, unspecified: Secondary | ICD-10-CM | POA: Diagnosis not present

## 2017-01-05 DIAGNOSIS — R262 Difficulty in walking, not elsewhere classified: Secondary | ICD-10-CM | POA: Diagnosis not present

## 2017-01-06 DIAGNOSIS — R1310 Dysphagia, unspecified: Secondary | ICD-10-CM | POA: Diagnosis not present

## 2017-01-06 DIAGNOSIS — R262 Difficulty in walking, not elsewhere classified: Secondary | ICD-10-CM | POA: Diagnosis not present

## 2017-01-06 DIAGNOSIS — M6281 Muscle weakness (generalized): Secondary | ICD-10-CM | POA: Diagnosis not present

## 2017-01-06 DIAGNOSIS — G8191 Hemiplegia, unspecified affecting right dominant side: Secondary | ICD-10-CM | POA: Diagnosis not present

## 2017-01-06 DIAGNOSIS — I69351 Hemiplegia and hemiparesis following cerebral infarction affecting right dominant side: Secondary | ICD-10-CM | POA: Diagnosis not present

## 2017-01-06 DIAGNOSIS — R488 Other symbolic dysfunctions: Secondary | ICD-10-CM | POA: Diagnosis not present

## 2017-01-07 DIAGNOSIS — I6930 Unspecified sequelae of cerebral infarction: Secondary | ICD-10-CM | POA: Diagnosis not present

## 2017-01-07 DIAGNOSIS — M6281 Muscle weakness (generalized): Secondary | ICD-10-CM | POA: Diagnosis not present

## 2017-01-07 DIAGNOSIS — G8191 Hemiplegia, unspecified affecting right dominant side: Secondary | ICD-10-CM | POA: Diagnosis not present

## 2017-01-07 DIAGNOSIS — I69351 Hemiplegia and hemiparesis following cerebral infarction affecting right dominant side: Secondary | ICD-10-CM | POA: Diagnosis not present

## 2017-01-07 DIAGNOSIS — F329 Major depressive disorder, single episode, unspecified: Secondary | ICD-10-CM | POA: Diagnosis not present

## 2017-01-07 DIAGNOSIS — R2681 Unsteadiness on feet: Secondary | ICD-10-CM | POA: Diagnosis not present

## 2017-01-07 DIAGNOSIS — R1310 Dysphagia, unspecified: Secondary | ICD-10-CM | POA: Diagnosis not present

## 2017-01-07 DIAGNOSIS — R262 Difficulty in walking, not elsewhere classified: Secondary | ICD-10-CM | POA: Diagnosis not present

## 2017-01-07 DIAGNOSIS — R488 Other symbolic dysfunctions: Secondary | ICD-10-CM | POA: Diagnosis not present

## 2017-01-07 DIAGNOSIS — E46 Unspecified protein-calorie malnutrition: Secondary | ICD-10-CM | POA: Diagnosis not present

## 2017-01-09 DIAGNOSIS — R262 Difficulty in walking, not elsewhere classified: Secondary | ICD-10-CM | POA: Diagnosis not present

## 2017-01-09 DIAGNOSIS — M6281 Muscle weakness (generalized): Secondary | ICD-10-CM | POA: Diagnosis not present

## 2017-01-09 DIAGNOSIS — R1310 Dysphagia, unspecified: Secondary | ICD-10-CM | POA: Diagnosis not present

## 2017-01-09 DIAGNOSIS — G8191 Hemiplegia, unspecified affecting right dominant side: Secondary | ICD-10-CM | POA: Diagnosis not present

## 2017-01-09 DIAGNOSIS — R488 Other symbolic dysfunctions: Secondary | ICD-10-CM | POA: Diagnosis not present

## 2017-01-09 DIAGNOSIS — I69351 Hemiplegia and hemiparesis following cerebral infarction affecting right dominant side: Secondary | ICD-10-CM | POA: Diagnosis not present

## 2017-01-11 DIAGNOSIS — I69351 Hemiplegia and hemiparesis following cerebral infarction affecting right dominant side: Secondary | ICD-10-CM | POA: Diagnosis not present

## 2017-01-11 DIAGNOSIS — M6281 Muscle weakness (generalized): Secondary | ICD-10-CM | POA: Diagnosis not present

## 2017-01-11 DIAGNOSIS — R1310 Dysphagia, unspecified: Secondary | ICD-10-CM | POA: Diagnosis not present

## 2017-01-11 DIAGNOSIS — R262 Difficulty in walking, not elsewhere classified: Secondary | ICD-10-CM | POA: Diagnosis not present

## 2017-01-11 DIAGNOSIS — R488 Other symbolic dysfunctions: Secondary | ICD-10-CM | POA: Diagnosis not present

## 2017-01-11 DIAGNOSIS — G8191 Hemiplegia, unspecified affecting right dominant side: Secondary | ICD-10-CM | POA: Diagnosis not present

## 2017-01-12 DIAGNOSIS — R1310 Dysphagia, unspecified: Secondary | ICD-10-CM | POA: Diagnosis not present

## 2017-01-12 DIAGNOSIS — R262 Difficulty in walking, not elsewhere classified: Secondary | ICD-10-CM | POA: Diagnosis not present

## 2017-01-12 DIAGNOSIS — G8191 Hemiplegia, unspecified affecting right dominant side: Secondary | ICD-10-CM | POA: Diagnosis not present

## 2017-01-12 DIAGNOSIS — R488 Other symbolic dysfunctions: Secondary | ICD-10-CM | POA: Diagnosis not present

## 2017-01-12 DIAGNOSIS — M6281 Muscle weakness (generalized): Secondary | ICD-10-CM | POA: Diagnosis not present

## 2017-01-12 DIAGNOSIS — I69351 Hemiplegia and hemiparesis following cerebral infarction affecting right dominant side: Secondary | ICD-10-CM | POA: Diagnosis not present

## 2017-01-13 DIAGNOSIS — R262 Difficulty in walking, not elsewhere classified: Secondary | ICD-10-CM | POA: Diagnosis not present

## 2017-01-13 DIAGNOSIS — M6281 Muscle weakness (generalized): Secondary | ICD-10-CM | POA: Diagnosis not present

## 2017-01-13 DIAGNOSIS — G8191 Hemiplegia, unspecified affecting right dominant side: Secondary | ICD-10-CM | POA: Diagnosis not present

## 2017-01-13 DIAGNOSIS — R1310 Dysphagia, unspecified: Secondary | ICD-10-CM | POA: Diagnosis not present

## 2017-01-13 DIAGNOSIS — R488 Other symbolic dysfunctions: Secondary | ICD-10-CM | POA: Diagnosis not present

## 2017-01-13 DIAGNOSIS — I69351 Hemiplegia and hemiparesis following cerebral infarction affecting right dominant side: Secondary | ICD-10-CM | POA: Diagnosis not present

## 2017-01-14 DIAGNOSIS — I69351 Hemiplegia and hemiparesis following cerebral infarction affecting right dominant side: Secondary | ICD-10-CM | POA: Diagnosis not present

## 2017-01-14 DIAGNOSIS — E46 Unspecified protein-calorie malnutrition: Secondary | ICD-10-CM | POA: Diagnosis not present

## 2017-01-14 DIAGNOSIS — G8191 Hemiplegia, unspecified affecting right dominant side: Secondary | ICD-10-CM | POA: Diagnosis not present

## 2017-01-14 DIAGNOSIS — J449 Chronic obstructive pulmonary disease, unspecified: Secondary | ICD-10-CM | POA: Diagnosis not present

## 2017-01-14 DIAGNOSIS — F329 Major depressive disorder, single episode, unspecified: Secondary | ICD-10-CM | POA: Diagnosis not present

## 2017-01-14 DIAGNOSIS — R262 Difficulty in walking, not elsewhere classified: Secondary | ICD-10-CM | POA: Diagnosis not present

## 2017-01-14 DIAGNOSIS — R488 Other symbolic dysfunctions: Secondary | ICD-10-CM | POA: Diagnosis not present

## 2017-01-14 DIAGNOSIS — R1310 Dysphagia, unspecified: Secondary | ICD-10-CM | POA: Diagnosis not present

## 2017-01-14 DIAGNOSIS — M6281 Muscle weakness (generalized): Secondary | ICD-10-CM | POA: Diagnosis not present

## 2017-01-17 DIAGNOSIS — M6281 Muscle weakness (generalized): Secondary | ICD-10-CM | POA: Diagnosis not present

## 2017-01-17 DIAGNOSIS — R1310 Dysphagia, unspecified: Secondary | ICD-10-CM | POA: Diagnosis not present

## 2017-01-17 DIAGNOSIS — G8191 Hemiplegia, unspecified affecting right dominant side: Secondary | ICD-10-CM | POA: Diagnosis not present

## 2017-01-17 DIAGNOSIS — R488 Other symbolic dysfunctions: Secondary | ICD-10-CM | POA: Diagnosis not present

## 2017-01-17 DIAGNOSIS — I69351 Hemiplegia and hemiparesis following cerebral infarction affecting right dominant side: Secondary | ICD-10-CM | POA: Diagnosis not present

## 2017-01-17 DIAGNOSIS — R262 Difficulty in walking, not elsewhere classified: Secondary | ICD-10-CM | POA: Diagnosis not present

## 2017-01-18 DIAGNOSIS — R262 Difficulty in walking, not elsewhere classified: Secondary | ICD-10-CM | POA: Diagnosis not present

## 2017-01-18 DIAGNOSIS — I69351 Hemiplegia and hemiparesis following cerebral infarction affecting right dominant side: Secondary | ICD-10-CM | POA: Diagnosis not present

## 2017-01-18 DIAGNOSIS — R1310 Dysphagia, unspecified: Secondary | ICD-10-CM | POA: Diagnosis not present

## 2017-01-18 DIAGNOSIS — G8191 Hemiplegia, unspecified affecting right dominant side: Secondary | ICD-10-CM | POA: Diagnosis not present

## 2017-01-18 DIAGNOSIS — M6281 Muscle weakness (generalized): Secondary | ICD-10-CM | POA: Diagnosis not present

## 2017-01-18 DIAGNOSIS — R488 Other symbolic dysfunctions: Secondary | ICD-10-CM | POA: Diagnosis not present

## 2017-01-19 DIAGNOSIS — M6281 Muscle weakness (generalized): Secondary | ICD-10-CM | POA: Diagnosis not present

## 2017-01-19 DIAGNOSIS — R1310 Dysphagia, unspecified: Secondary | ICD-10-CM | POA: Diagnosis not present

## 2017-01-19 DIAGNOSIS — I69351 Hemiplegia and hemiparesis following cerebral infarction affecting right dominant side: Secondary | ICD-10-CM | POA: Diagnosis not present

## 2017-01-19 DIAGNOSIS — G8191 Hemiplegia, unspecified affecting right dominant side: Secondary | ICD-10-CM | POA: Diagnosis not present

## 2017-01-19 DIAGNOSIS — R488 Other symbolic dysfunctions: Secondary | ICD-10-CM | POA: Diagnosis not present

## 2017-01-19 DIAGNOSIS — R262 Difficulty in walking, not elsewhere classified: Secondary | ICD-10-CM | POA: Diagnosis not present

## 2017-01-20 DIAGNOSIS — I6930 Unspecified sequelae of cerebral infarction: Secondary | ICD-10-CM | POA: Diagnosis not present

## 2017-01-20 DIAGNOSIS — G8191 Hemiplegia, unspecified affecting right dominant side: Secondary | ICD-10-CM | POA: Diagnosis not present

## 2017-01-20 DIAGNOSIS — M6281 Muscle weakness (generalized): Secondary | ICD-10-CM | POA: Diagnosis not present

## 2017-01-20 DIAGNOSIS — E46 Unspecified protein-calorie malnutrition: Secondary | ICD-10-CM | POA: Diagnosis not present

## 2017-01-20 DIAGNOSIS — I69351 Hemiplegia and hemiparesis following cerebral infarction affecting right dominant side: Secondary | ICD-10-CM | POA: Diagnosis not present

## 2017-01-20 DIAGNOSIS — R262 Difficulty in walking, not elsewhere classified: Secondary | ICD-10-CM | POA: Diagnosis not present

## 2017-01-20 DIAGNOSIS — J449 Chronic obstructive pulmonary disease, unspecified: Secondary | ICD-10-CM | POA: Diagnosis not present

## 2017-01-20 DIAGNOSIS — R1310 Dysphagia, unspecified: Secondary | ICD-10-CM | POA: Diagnosis not present

## 2017-01-20 DIAGNOSIS — R488 Other symbolic dysfunctions: Secondary | ICD-10-CM | POA: Diagnosis not present

## 2017-01-21 DIAGNOSIS — I69351 Hemiplegia and hemiparesis following cerebral infarction affecting right dominant side: Secondary | ICD-10-CM | POA: Diagnosis not present

## 2017-01-21 DIAGNOSIS — G8191 Hemiplegia, unspecified affecting right dominant side: Secondary | ICD-10-CM | POA: Diagnosis not present

## 2017-01-21 DIAGNOSIS — R262 Difficulty in walking, not elsewhere classified: Secondary | ICD-10-CM | POA: Diagnosis not present

## 2017-01-21 DIAGNOSIS — M6281 Muscle weakness (generalized): Secondary | ICD-10-CM | POA: Diagnosis not present

## 2017-01-21 DIAGNOSIS — R488 Other symbolic dysfunctions: Secondary | ICD-10-CM | POA: Diagnosis not present

## 2017-01-21 DIAGNOSIS — R1310 Dysphagia, unspecified: Secondary | ICD-10-CM | POA: Diagnosis not present

## 2017-01-24 DIAGNOSIS — G8191 Hemiplegia, unspecified affecting right dominant side: Secondary | ICD-10-CM | POA: Diagnosis not present

## 2017-01-24 DIAGNOSIS — R262 Difficulty in walking, not elsewhere classified: Secondary | ICD-10-CM | POA: Diagnosis not present

## 2017-01-24 DIAGNOSIS — M6281 Muscle weakness (generalized): Secondary | ICD-10-CM | POA: Diagnosis not present

## 2017-01-24 DIAGNOSIS — I69351 Hemiplegia and hemiparesis following cerebral infarction affecting right dominant side: Secondary | ICD-10-CM | POA: Diagnosis not present

## 2017-01-24 DIAGNOSIS — R1310 Dysphagia, unspecified: Secondary | ICD-10-CM | POA: Diagnosis not present

## 2017-01-24 DIAGNOSIS — R488 Other symbolic dysfunctions: Secondary | ICD-10-CM | POA: Diagnosis not present

## 2017-01-25 DIAGNOSIS — R1310 Dysphagia, unspecified: Secondary | ICD-10-CM | POA: Diagnosis not present

## 2017-01-25 DIAGNOSIS — R488 Other symbolic dysfunctions: Secondary | ICD-10-CM | POA: Diagnosis not present

## 2017-01-25 DIAGNOSIS — R262 Difficulty in walking, not elsewhere classified: Secondary | ICD-10-CM | POA: Diagnosis not present

## 2017-01-25 DIAGNOSIS — M6281 Muscle weakness (generalized): Secondary | ICD-10-CM | POA: Diagnosis not present

## 2017-01-25 DIAGNOSIS — I69351 Hemiplegia and hemiparesis following cerebral infarction affecting right dominant side: Secondary | ICD-10-CM | POA: Diagnosis not present

## 2017-01-25 DIAGNOSIS — G8191 Hemiplegia, unspecified affecting right dominant side: Secondary | ICD-10-CM | POA: Diagnosis not present

## 2017-01-26 DIAGNOSIS — R488 Other symbolic dysfunctions: Secondary | ICD-10-CM | POA: Diagnosis not present

## 2017-01-26 DIAGNOSIS — L603 Nail dystrophy: Secondary | ICD-10-CM | POA: Diagnosis not present

## 2017-01-26 DIAGNOSIS — E46 Unspecified protein-calorie malnutrition: Secondary | ICD-10-CM | POA: Diagnosis not present

## 2017-01-26 DIAGNOSIS — J449 Chronic obstructive pulmonary disease, unspecified: Secondary | ICD-10-CM | POA: Diagnosis not present

## 2017-01-26 DIAGNOSIS — R1310 Dysphagia, unspecified: Secondary | ICD-10-CM | POA: Diagnosis not present

## 2017-01-26 DIAGNOSIS — G40909 Epilepsy, unspecified, not intractable, without status epilepticus: Secondary | ICD-10-CM | POA: Diagnosis not present

## 2017-01-26 DIAGNOSIS — I69351 Hemiplegia and hemiparesis following cerebral infarction affecting right dominant side: Secondary | ICD-10-CM | POA: Diagnosis not present

## 2017-01-26 DIAGNOSIS — G8191 Hemiplegia, unspecified affecting right dominant side: Secondary | ICD-10-CM | POA: Diagnosis not present

## 2017-01-26 DIAGNOSIS — I739 Peripheral vascular disease, unspecified: Secondary | ICD-10-CM | POA: Diagnosis not present

## 2017-01-26 DIAGNOSIS — B351 Tinea unguium: Secondary | ICD-10-CM | POA: Diagnosis not present

## 2017-01-26 DIAGNOSIS — M6281 Muscle weakness (generalized): Secondary | ICD-10-CM | POA: Diagnosis not present

## 2017-01-26 DIAGNOSIS — R262 Difficulty in walking, not elsewhere classified: Secondary | ICD-10-CM | POA: Diagnosis not present

## 2017-01-27 DIAGNOSIS — G8191 Hemiplegia, unspecified affecting right dominant side: Secondary | ICD-10-CM | POA: Diagnosis not present

## 2017-01-27 DIAGNOSIS — R1310 Dysphagia, unspecified: Secondary | ICD-10-CM | POA: Diagnosis not present

## 2017-01-27 DIAGNOSIS — R488 Other symbolic dysfunctions: Secondary | ICD-10-CM | POA: Diagnosis not present

## 2017-01-27 DIAGNOSIS — I69351 Hemiplegia and hemiparesis following cerebral infarction affecting right dominant side: Secondary | ICD-10-CM | POA: Diagnosis not present

## 2017-01-27 DIAGNOSIS — I6930 Unspecified sequelae of cerebral infarction: Secondary | ICD-10-CM | POA: Diagnosis not present

## 2017-01-27 DIAGNOSIS — F329 Major depressive disorder, single episode, unspecified: Secondary | ICD-10-CM | POA: Diagnosis not present

## 2017-01-27 DIAGNOSIS — E46 Unspecified protein-calorie malnutrition: Secondary | ICD-10-CM | POA: Diagnosis not present

## 2017-01-27 DIAGNOSIS — G4700 Insomnia, unspecified: Secondary | ICD-10-CM | POA: Diagnosis not present

## 2017-01-27 DIAGNOSIS — R262 Difficulty in walking, not elsewhere classified: Secondary | ICD-10-CM | POA: Diagnosis not present

## 2017-01-27 DIAGNOSIS — M6281 Muscle weakness (generalized): Secondary | ICD-10-CM | POA: Diagnosis not present

## 2017-01-28 DIAGNOSIS — R488 Other symbolic dysfunctions: Secondary | ICD-10-CM | POA: Diagnosis not present

## 2017-01-28 DIAGNOSIS — I69351 Hemiplegia and hemiparesis following cerebral infarction affecting right dominant side: Secondary | ICD-10-CM | POA: Diagnosis not present

## 2017-01-28 DIAGNOSIS — M6281 Muscle weakness (generalized): Secondary | ICD-10-CM | POA: Diagnosis not present

## 2017-01-28 DIAGNOSIS — R262 Difficulty in walking, not elsewhere classified: Secondary | ICD-10-CM | POA: Diagnosis not present

## 2017-01-28 DIAGNOSIS — R1310 Dysphagia, unspecified: Secondary | ICD-10-CM | POA: Diagnosis not present

## 2017-01-28 DIAGNOSIS — G8191 Hemiplegia, unspecified affecting right dominant side: Secondary | ICD-10-CM | POA: Diagnosis not present

## 2017-01-31 DIAGNOSIS — R262 Difficulty in walking, not elsewhere classified: Secondary | ICD-10-CM | POA: Diagnosis not present

## 2017-01-31 DIAGNOSIS — G8191 Hemiplegia, unspecified affecting right dominant side: Secondary | ICD-10-CM | POA: Diagnosis not present

## 2017-01-31 DIAGNOSIS — R488 Other symbolic dysfunctions: Secondary | ICD-10-CM | POA: Diagnosis not present

## 2017-01-31 DIAGNOSIS — M6281 Muscle weakness (generalized): Secondary | ICD-10-CM | POA: Diagnosis not present

## 2017-01-31 DIAGNOSIS — R1310 Dysphagia, unspecified: Secondary | ICD-10-CM | POA: Diagnosis not present

## 2017-01-31 DIAGNOSIS — I69351 Hemiplegia and hemiparesis following cerebral infarction affecting right dominant side: Secondary | ICD-10-CM | POA: Diagnosis not present

## 2017-02-01 DIAGNOSIS — I69351 Hemiplegia and hemiparesis following cerebral infarction affecting right dominant side: Secondary | ICD-10-CM | POA: Diagnosis not present

## 2017-02-01 DIAGNOSIS — R488 Other symbolic dysfunctions: Secondary | ICD-10-CM | POA: Diagnosis not present

## 2017-02-01 DIAGNOSIS — R262 Difficulty in walking, not elsewhere classified: Secondary | ICD-10-CM | POA: Diagnosis not present

## 2017-02-01 DIAGNOSIS — G8191 Hemiplegia, unspecified affecting right dominant side: Secondary | ICD-10-CM | POA: Diagnosis not present

## 2017-02-01 DIAGNOSIS — M6281 Muscle weakness (generalized): Secondary | ICD-10-CM | POA: Diagnosis not present

## 2017-02-01 DIAGNOSIS — R1310 Dysphagia, unspecified: Secondary | ICD-10-CM | POA: Diagnosis not present

## 2017-02-02 DIAGNOSIS — M6281 Muscle weakness (generalized): Secondary | ICD-10-CM | POA: Diagnosis not present

## 2017-02-02 DIAGNOSIS — G8191 Hemiplegia, unspecified affecting right dominant side: Secondary | ICD-10-CM | POA: Diagnosis not present

## 2017-02-02 DIAGNOSIS — R1310 Dysphagia, unspecified: Secondary | ICD-10-CM | POA: Diagnosis not present

## 2017-02-02 DIAGNOSIS — R488 Other symbolic dysfunctions: Secondary | ICD-10-CM | POA: Diagnosis not present

## 2017-02-02 DIAGNOSIS — R262 Difficulty in walking, not elsewhere classified: Secondary | ICD-10-CM | POA: Diagnosis not present

## 2017-02-02 DIAGNOSIS — I69351 Hemiplegia and hemiparesis following cerebral infarction affecting right dominant side: Secondary | ICD-10-CM | POA: Diagnosis not present

## 2017-02-03 DIAGNOSIS — R488 Other symbolic dysfunctions: Secondary | ICD-10-CM | POA: Diagnosis not present

## 2017-02-03 DIAGNOSIS — M6281 Muscle weakness (generalized): Secondary | ICD-10-CM | POA: Diagnosis not present

## 2017-02-03 DIAGNOSIS — G8191 Hemiplegia, unspecified affecting right dominant side: Secondary | ICD-10-CM | POA: Diagnosis not present

## 2017-02-03 DIAGNOSIS — R1310 Dysphagia, unspecified: Secondary | ICD-10-CM | POA: Diagnosis not present

## 2017-02-03 DIAGNOSIS — I69351 Hemiplegia and hemiparesis following cerebral infarction affecting right dominant side: Secondary | ICD-10-CM | POA: Diagnosis not present

## 2017-02-03 DIAGNOSIS — R262 Difficulty in walking, not elsewhere classified: Secondary | ICD-10-CM | POA: Diagnosis not present

## 2017-02-04 DIAGNOSIS — R262 Difficulty in walking, not elsewhere classified: Secondary | ICD-10-CM | POA: Diagnosis not present

## 2017-02-04 DIAGNOSIS — I69351 Hemiplegia and hemiparesis following cerebral infarction affecting right dominant side: Secondary | ICD-10-CM | POA: Diagnosis not present

## 2017-02-04 DIAGNOSIS — R1310 Dysphagia, unspecified: Secondary | ICD-10-CM | POA: Diagnosis not present

## 2017-02-04 DIAGNOSIS — M6281 Muscle weakness (generalized): Secondary | ICD-10-CM | POA: Diagnosis not present

## 2017-02-04 DIAGNOSIS — G8191 Hemiplegia, unspecified affecting right dominant side: Secondary | ICD-10-CM | POA: Diagnosis not present

## 2017-02-04 DIAGNOSIS — R488 Other symbolic dysfunctions: Secondary | ICD-10-CM | POA: Diagnosis not present

## 2017-02-07 DIAGNOSIS — G8191 Hemiplegia, unspecified affecting right dominant side: Secondary | ICD-10-CM | POA: Diagnosis not present

## 2017-02-07 DIAGNOSIS — R488 Other symbolic dysfunctions: Secondary | ICD-10-CM | POA: Diagnosis not present

## 2017-02-07 DIAGNOSIS — M6281 Muscle weakness (generalized): Secondary | ICD-10-CM | POA: Diagnosis not present

## 2017-02-07 DIAGNOSIS — R1310 Dysphagia, unspecified: Secondary | ICD-10-CM | POA: Diagnosis not present

## 2017-02-07 DIAGNOSIS — R262 Difficulty in walking, not elsewhere classified: Secondary | ICD-10-CM | POA: Diagnosis not present

## 2017-02-07 DIAGNOSIS — I69351 Hemiplegia and hemiparesis following cerebral infarction affecting right dominant side: Secondary | ICD-10-CM | POA: Diagnosis not present

## 2017-02-08 DIAGNOSIS — I69351 Hemiplegia and hemiparesis following cerebral infarction affecting right dominant side: Secondary | ICD-10-CM | POA: Diagnosis not present

## 2017-02-08 DIAGNOSIS — R262 Difficulty in walking, not elsewhere classified: Secondary | ICD-10-CM | POA: Diagnosis not present

## 2017-02-08 DIAGNOSIS — M6281 Muscle weakness (generalized): Secondary | ICD-10-CM | POA: Diagnosis not present

## 2017-02-08 DIAGNOSIS — G8191 Hemiplegia, unspecified affecting right dominant side: Secondary | ICD-10-CM | POA: Diagnosis not present

## 2017-02-08 DIAGNOSIS — R488 Other symbolic dysfunctions: Secondary | ICD-10-CM | POA: Diagnosis not present

## 2017-02-08 DIAGNOSIS — R1310 Dysphagia, unspecified: Secondary | ICD-10-CM | POA: Diagnosis not present

## 2017-02-09 DIAGNOSIS — R1310 Dysphagia, unspecified: Secondary | ICD-10-CM | POA: Diagnosis not present

## 2017-02-09 DIAGNOSIS — R262 Difficulty in walking, not elsewhere classified: Secondary | ICD-10-CM | POA: Diagnosis not present

## 2017-02-09 DIAGNOSIS — G8191 Hemiplegia, unspecified affecting right dominant side: Secondary | ICD-10-CM | POA: Diagnosis not present

## 2017-02-09 DIAGNOSIS — R488 Other symbolic dysfunctions: Secondary | ICD-10-CM | POA: Diagnosis not present

## 2017-02-09 DIAGNOSIS — M6281 Muscle weakness (generalized): Secondary | ICD-10-CM | POA: Diagnosis not present

## 2017-02-09 DIAGNOSIS — I69351 Hemiplegia and hemiparesis following cerebral infarction affecting right dominant side: Secondary | ICD-10-CM | POA: Diagnosis not present

## 2017-02-10 DIAGNOSIS — R488 Other symbolic dysfunctions: Secondary | ICD-10-CM | POA: Diagnosis not present

## 2017-02-10 DIAGNOSIS — R1310 Dysphagia, unspecified: Secondary | ICD-10-CM | POA: Diagnosis not present

## 2017-02-10 DIAGNOSIS — R262 Difficulty in walking, not elsewhere classified: Secondary | ICD-10-CM | POA: Diagnosis not present

## 2017-02-10 DIAGNOSIS — G8191 Hemiplegia, unspecified affecting right dominant side: Secondary | ICD-10-CM | POA: Diagnosis not present

## 2017-02-10 DIAGNOSIS — I69351 Hemiplegia and hemiparesis following cerebral infarction affecting right dominant side: Secondary | ICD-10-CM | POA: Diagnosis not present

## 2017-02-10 DIAGNOSIS — M6281 Muscle weakness (generalized): Secondary | ICD-10-CM | POA: Diagnosis not present

## 2017-02-11 DIAGNOSIS — J449 Chronic obstructive pulmonary disease, unspecified: Secondary | ICD-10-CM | POA: Diagnosis not present

## 2017-02-11 DIAGNOSIS — E785 Hyperlipidemia, unspecified: Secondary | ICD-10-CM | POA: Diagnosis not present

## 2017-02-11 DIAGNOSIS — F329 Major depressive disorder, single episode, unspecified: Secondary | ICD-10-CM | POA: Diagnosis not present

## 2017-02-11 DIAGNOSIS — E559 Vitamin D deficiency, unspecified: Secondary | ICD-10-CM | POA: Diagnosis not present

## 2017-02-22 DIAGNOSIS — I6930 Unspecified sequelae of cerebral infarction: Secondary | ICD-10-CM | POA: Diagnosis not present

## 2017-02-22 DIAGNOSIS — E46 Unspecified protein-calorie malnutrition: Secondary | ICD-10-CM | POA: Diagnosis not present

## 2017-02-22 DIAGNOSIS — J449 Chronic obstructive pulmonary disease, unspecified: Secondary | ICD-10-CM | POA: Diagnosis not present

## 2017-02-22 DIAGNOSIS — G8191 Hemiplegia, unspecified affecting right dominant side: Secondary | ICD-10-CM | POA: Diagnosis not present

## 2017-02-23 DIAGNOSIS — E785 Hyperlipidemia, unspecified: Secondary | ICD-10-CM | POA: Diagnosis not present

## 2017-02-23 DIAGNOSIS — R6889 Other general symptoms and signs: Secondary | ICD-10-CM | POA: Diagnosis not present

## 2017-02-23 DIAGNOSIS — E559 Vitamin D deficiency, unspecified: Secondary | ICD-10-CM | POA: Diagnosis not present

## 2017-02-23 DIAGNOSIS — D649 Anemia, unspecified: Secondary | ICD-10-CM | POA: Diagnosis not present

## 2017-02-23 DIAGNOSIS — E119 Type 2 diabetes mellitus without complications: Secondary | ICD-10-CM | POA: Diagnosis not present

## 2017-02-23 DIAGNOSIS — E039 Hypothyroidism, unspecified: Secondary | ICD-10-CM | POA: Diagnosis not present

## 2017-02-23 DIAGNOSIS — R7989 Other specified abnormal findings of blood chemistry: Secondary | ICD-10-CM | POA: Diagnosis not present

## 2017-02-24 DIAGNOSIS — R05 Cough: Secondary | ICD-10-CM | POA: Diagnosis not present

## 2017-02-24 DIAGNOSIS — E559 Vitamin D deficiency, unspecified: Secondary | ICD-10-CM | POA: Diagnosis not present

## 2017-02-24 DIAGNOSIS — D72829 Elevated white blood cell count, unspecified: Secondary | ICD-10-CM | POA: Diagnosis not present

## 2017-03-04 DIAGNOSIS — E46 Unspecified protein-calorie malnutrition: Secondary | ICD-10-CM | POA: Diagnosis not present

## 2017-03-04 DIAGNOSIS — F329 Major depressive disorder, single episode, unspecified: Secondary | ICD-10-CM | POA: Diagnosis not present

## 2017-03-04 DIAGNOSIS — J449 Chronic obstructive pulmonary disease, unspecified: Secondary | ICD-10-CM | POA: Diagnosis not present

## 2017-03-04 DIAGNOSIS — E785 Hyperlipidemia, unspecified: Secondary | ICD-10-CM | POA: Diagnosis not present

## 2017-03-21 DIAGNOSIS — J449 Chronic obstructive pulmonary disease, unspecified: Secondary | ICD-10-CM | POA: Diagnosis not present

## 2017-03-21 DIAGNOSIS — E559 Vitamin D deficiency, unspecified: Secondary | ICD-10-CM | POA: Diagnosis not present

## 2017-03-21 DIAGNOSIS — G8191 Hemiplegia, unspecified affecting right dominant side: Secondary | ICD-10-CM | POA: Diagnosis not present

## 2017-03-21 DIAGNOSIS — G4700 Insomnia, unspecified: Secondary | ICD-10-CM | POA: Diagnosis not present

## 2017-04-01 DIAGNOSIS — J449 Chronic obstructive pulmonary disease, unspecified: Secondary | ICD-10-CM | POA: Diagnosis not present

## 2017-04-01 DIAGNOSIS — E559 Vitamin D deficiency, unspecified: Secondary | ICD-10-CM | POA: Diagnosis not present

## 2017-04-01 DIAGNOSIS — F329 Major depressive disorder, single episode, unspecified: Secondary | ICD-10-CM | POA: Diagnosis not present

## 2017-04-01 DIAGNOSIS — E785 Hyperlipidemia, unspecified: Secondary | ICD-10-CM | POA: Diagnosis not present

## 2017-04-11 DIAGNOSIS — L603 Nail dystrophy: Secondary | ICD-10-CM | POA: Diagnosis not present

## 2017-04-11 DIAGNOSIS — R1310 Dysphagia, unspecified: Secondary | ICD-10-CM | POA: Diagnosis not present

## 2017-04-11 DIAGNOSIS — M6281 Muscle weakness (generalized): Secondary | ICD-10-CM | POA: Diagnosis not present

## 2017-04-11 DIAGNOSIS — I69351 Hemiplegia and hemiparesis following cerebral infarction affecting right dominant side: Secondary | ICD-10-CM | POA: Diagnosis not present

## 2017-04-11 DIAGNOSIS — G8191 Hemiplegia, unspecified affecting right dominant side: Secondary | ICD-10-CM | POA: Diagnosis not present

## 2017-04-11 DIAGNOSIS — I739 Peripheral vascular disease, unspecified: Secondary | ICD-10-CM | POA: Diagnosis not present

## 2017-04-11 DIAGNOSIS — R262 Difficulty in walking, not elsewhere classified: Secondary | ICD-10-CM | POA: Diagnosis not present

## 2017-04-11 DIAGNOSIS — R488 Other symbolic dysfunctions: Secondary | ICD-10-CM | POA: Diagnosis not present

## 2017-04-12 DIAGNOSIS — G8191 Hemiplegia, unspecified affecting right dominant side: Secondary | ICD-10-CM | POA: Diagnosis not present

## 2017-04-12 DIAGNOSIS — R1310 Dysphagia, unspecified: Secondary | ICD-10-CM | POA: Diagnosis not present

## 2017-04-12 DIAGNOSIS — R488 Other symbolic dysfunctions: Secondary | ICD-10-CM | POA: Diagnosis not present

## 2017-04-12 DIAGNOSIS — M6281 Muscle weakness (generalized): Secondary | ICD-10-CM | POA: Diagnosis not present

## 2017-04-12 DIAGNOSIS — R262 Difficulty in walking, not elsewhere classified: Secondary | ICD-10-CM | POA: Diagnosis not present

## 2017-04-12 DIAGNOSIS — I69351 Hemiplegia and hemiparesis following cerebral infarction affecting right dominant side: Secondary | ICD-10-CM | POA: Diagnosis not present

## 2017-04-13 DIAGNOSIS — I69351 Hemiplegia and hemiparesis following cerebral infarction affecting right dominant side: Secondary | ICD-10-CM | POA: Diagnosis not present

## 2017-04-13 DIAGNOSIS — M6281 Muscle weakness (generalized): Secondary | ICD-10-CM | POA: Diagnosis not present

## 2017-04-13 DIAGNOSIS — R1310 Dysphagia, unspecified: Secondary | ICD-10-CM | POA: Diagnosis not present

## 2017-04-13 DIAGNOSIS — G8191 Hemiplegia, unspecified affecting right dominant side: Secondary | ICD-10-CM | POA: Diagnosis not present

## 2017-04-13 DIAGNOSIS — R262 Difficulty in walking, not elsewhere classified: Secondary | ICD-10-CM | POA: Diagnosis not present

## 2017-04-13 DIAGNOSIS — R488 Other symbolic dysfunctions: Secondary | ICD-10-CM | POA: Diagnosis not present

## 2017-04-14 DIAGNOSIS — R488 Other symbolic dysfunctions: Secondary | ICD-10-CM | POA: Diagnosis not present

## 2017-04-14 DIAGNOSIS — R1310 Dysphagia, unspecified: Secondary | ICD-10-CM | POA: Diagnosis not present

## 2017-04-14 DIAGNOSIS — R262 Difficulty in walking, not elsewhere classified: Secondary | ICD-10-CM | POA: Diagnosis not present

## 2017-04-14 DIAGNOSIS — I69351 Hemiplegia and hemiparesis following cerebral infarction affecting right dominant side: Secondary | ICD-10-CM | POA: Diagnosis not present

## 2017-04-14 DIAGNOSIS — G8191 Hemiplegia, unspecified affecting right dominant side: Secondary | ICD-10-CM | POA: Diagnosis not present

## 2017-04-14 DIAGNOSIS — M6281 Muscle weakness (generalized): Secondary | ICD-10-CM | POA: Diagnosis not present

## 2017-04-15 DIAGNOSIS — I6930 Unspecified sequelae of cerebral infarction: Secondary | ICD-10-CM | POA: Diagnosis not present

## 2017-04-15 DIAGNOSIS — R262 Difficulty in walking, not elsewhere classified: Secondary | ICD-10-CM | POA: Diagnosis not present

## 2017-04-15 DIAGNOSIS — E785 Hyperlipidemia, unspecified: Secondary | ICD-10-CM | POA: Diagnosis not present

## 2017-04-15 DIAGNOSIS — M6281 Muscle weakness (generalized): Secondary | ICD-10-CM | POA: Diagnosis not present

## 2017-04-15 DIAGNOSIS — I69351 Hemiplegia and hemiparesis following cerebral infarction affecting right dominant side: Secondary | ICD-10-CM | POA: Diagnosis not present

## 2017-04-15 DIAGNOSIS — R1310 Dysphagia, unspecified: Secondary | ICD-10-CM | POA: Diagnosis not present

## 2017-04-15 DIAGNOSIS — R488 Other symbolic dysfunctions: Secondary | ICD-10-CM | POA: Diagnosis not present

## 2017-04-15 DIAGNOSIS — J449 Chronic obstructive pulmonary disease, unspecified: Secondary | ICD-10-CM | POA: Diagnosis not present

## 2017-04-15 DIAGNOSIS — G8191 Hemiplegia, unspecified affecting right dominant side: Secondary | ICD-10-CM | POA: Diagnosis not present

## 2017-04-18 DIAGNOSIS — R262 Difficulty in walking, not elsewhere classified: Secondary | ICD-10-CM | POA: Diagnosis not present

## 2017-04-18 DIAGNOSIS — M6281 Muscle weakness (generalized): Secondary | ICD-10-CM | POA: Diagnosis not present

## 2017-04-18 DIAGNOSIS — G8191 Hemiplegia, unspecified affecting right dominant side: Secondary | ICD-10-CM | POA: Diagnosis not present

## 2017-04-18 DIAGNOSIS — R1310 Dysphagia, unspecified: Secondary | ICD-10-CM | POA: Diagnosis not present

## 2017-04-18 DIAGNOSIS — I69351 Hemiplegia and hemiparesis following cerebral infarction affecting right dominant side: Secondary | ICD-10-CM | POA: Diagnosis not present

## 2017-04-18 DIAGNOSIS — R488 Other symbolic dysfunctions: Secondary | ICD-10-CM | POA: Diagnosis not present

## 2017-04-19 DIAGNOSIS — M6281 Muscle weakness (generalized): Secondary | ICD-10-CM | POA: Diagnosis not present

## 2017-04-19 DIAGNOSIS — G8191 Hemiplegia, unspecified affecting right dominant side: Secondary | ICD-10-CM | POA: Diagnosis not present

## 2017-04-19 DIAGNOSIS — R488 Other symbolic dysfunctions: Secondary | ICD-10-CM | POA: Diagnosis not present

## 2017-04-19 DIAGNOSIS — R1310 Dysphagia, unspecified: Secondary | ICD-10-CM | POA: Diagnosis not present

## 2017-04-19 DIAGNOSIS — R262 Difficulty in walking, not elsewhere classified: Secondary | ICD-10-CM | POA: Diagnosis not present

## 2017-04-19 DIAGNOSIS — I69351 Hemiplegia and hemiparesis following cerebral infarction affecting right dominant side: Secondary | ICD-10-CM | POA: Diagnosis not present

## 2017-04-20 DIAGNOSIS — G8191 Hemiplegia, unspecified affecting right dominant side: Secondary | ICD-10-CM | POA: Diagnosis not present

## 2017-04-20 DIAGNOSIS — I69351 Hemiplegia and hemiparesis following cerebral infarction affecting right dominant side: Secondary | ICD-10-CM | POA: Diagnosis not present

## 2017-04-20 DIAGNOSIS — M6281 Muscle weakness (generalized): Secondary | ICD-10-CM | POA: Diagnosis not present

## 2017-04-20 DIAGNOSIS — R1310 Dysphagia, unspecified: Secondary | ICD-10-CM | POA: Diagnosis not present

## 2017-04-20 DIAGNOSIS — R262 Difficulty in walking, not elsewhere classified: Secondary | ICD-10-CM | POA: Diagnosis not present

## 2017-04-20 DIAGNOSIS — R488 Other symbolic dysfunctions: Secondary | ICD-10-CM | POA: Diagnosis not present

## 2017-04-21 DIAGNOSIS — R488 Other symbolic dysfunctions: Secondary | ICD-10-CM | POA: Diagnosis not present

## 2017-04-21 DIAGNOSIS — I69351 Hemiplegia and hemiparesis following cerebral infarction affecting right dominant side: Secondary | ICD-10-CM | POA: Diagnosis not present

## 2017-04-21 DIAGNOSIS — G8191 Hemiplegia, unspecified affecting right dominant side: Secondary | ICD-10-CM | POA: Diagnosis not present

## 2017-04-21 DIAGNOSIS — M6281 Muscle weakness (generalized): Secondary | ICD-10-CM | POA: Diagnosis not present

## 2017-04-21 DIAGNOSIS — R1310 Dysphagia, unspecified: Secondary | ICD-10-CM | POA: Diagnosis not present

## 2017-04-21 DIAGNOSIS — R262 Difficulty in walking, not elsewhere classified: Secondary | ICD-10-CM | POA: Diagnosis not present

## 2017-04-22 DIAGNOSIS — G8191 Hemiplegia, unspecified affecting right dominant side: Secondary | ICD-10-CM | POA: Diagnosis not present

## 2017-04-22 DIAGNOSIS — I69351 Hemiplegia and hemiparesis following cerebral infarction affecting right dominant side: Secondary | ICD-10-CM | POA: Diagnosis not present

## 2017-04-22 DIAGNOSIS — M6281 Muscle weakness (generalized): Secondary | ICD-10-CM | POA: Diagnosis not present

## 2017-04-22 DIAGNOSIS — R1310 Dysphagia, unspecified: Secondary | ICD-10-CM | POA: Diagnosis not present

## 2017-04-22 DIAGNOSIS — R488 Other symbolic dysfunctions: Secondary | ICD-10-CM | POA: Diagnosis not present

## 2017-04-22 DIAGNOSIS — R262 Difficulty in walking, not elsewhere classified: Secondary | ICD-10-CM | POA: Diagnosis not present

## 2017-04-25 DIAGNOSIS — M6281 Muscle weakness (generalized): Secondary | ICD-10-CM | POA: Diagnosis not present

## 2017-04-25 DIAGNOSIS — R262 Difficulty in walking, not elsewhere classified: Secondary | ICD-10-CM | POA: Diagnosis not present

## 2017-04-25 DIAGNOSIS — I69351 Hemiplegia and hemiparesis following cerebral infarction affecting right dominant side: Secondary | ICD-10-CM | POA: Diagnosis not present

## 2017-04-25 DIAGNOSIS — G8191 Hemiplegia, unspecified affecting right dominant side: Secondary | ICD-10-CM | POA: Diagnosis not present

## 2017-04-25 DIAGNOSIS — R488 Other symbolic dysfunctions: Secondary | ICD-10-CM | POA: Diagnosis not present

## 2017-04-25 DIAGNOSIS — R1310 Dysphagia, unspecified: Secondary | ICD-10-CM | POA: Diagnosis not present

## 2017-04-26 DIAGNOSIS — G8191 Hemiplegia, unspecified affecting right dominant side: Secondary | ICD-10-CM | POA: Diagnosis not present

## 2017-04-26 DIAGNOSIS — R262 Difficulty in walking, not elsewhere classified: Secondary | ICD-10-CM | POA: Diagnosis not present

## 2017-04-26 DIAGNOSIS — M6281 Muscle weakness (generalized): Secondary | ICD-10-CM | POA: Diagnosis not present

## 2017-04-26 DIAGNOSIS — R488 Other symbolic dysfunctions: Secondary | ICD-10-CM | POA: Diagnosis not present

## 2017-04-26 DIAGNOSIS — R1310 Dysphagia, unspecified: Secondary | ICD-10-CM | POA: Diagnosis not present

## 2017-04-26 DIAGNOSIS — I69351 Hemiplegia and hemiparesis following cerebral infarction affecting right dominant side: Secondary | ICD-10-CM | POA: Diagnosis not present

## 2017-04-27 DIAGNOSIS — R488 Other symbolic dysfunctions: Secondary | ICD-10-CM | POA: Diagnosis not present

## 2017-04-27 DIAGNOSIS — R262 Difficulty in walking, not elsewhere classified: Secondary | ICD-10-CM | POA: Diagnosis not present

## 2017-04-27 DIAGNOSIS — I69351 Hemiplegia and hemiparesis following cerebral infarction affecting right dominant side: Secondary | ICD-10-CM | POA: Diagnosis not present

## 2017-04-27 DIAGNOSIS — G8191 Hemiplegia, unspecified affecting right dominant side: Secondary | ICD-10-CM | POA: Diagnosis not present

## 2017-04-27 DIAGNOSIS — R1310 Dysphagia, unspecified: Secondary | ICD-10-CM | POA: Diagnosis not present

## 2017-04-27 DIAGNOSIS — M6281 Muscle weakness (generalized): Secondary | ICD-10-CM | POA: Diagnosis not present

## 2017-04-28 DIAGNOSIS — G8191 Hemiplegia, unspecified affecting right dominant side: Secondary | ICD-10-CM | POA: Diagnosis not present

## 2017-04-28 DIAGNOSIS — R1310 Dysphagia, unspecified: Secondary | ICD-10-CM | POA: Diagnosis not present

## 2017-04-28 DIAGNOSIS — R262 Difficulty in walking, not elsewhere classified: Secondary | ICD-10-CM | POA: Diagnosis not present

## 2017-04-28 DIAGNOSIS — R488 Other symbolic dysfunctions: Secondary | ICD-10-CM | POA: Diagnosis not present

## 2017-04-28 DIAGNOSIS — I69351 Hemiplegia and hemiparesis following cerebral infarction affecting right dominant side: Secondary | ICD-10-CM | POA: Diagnosis not present

## 2017-04-28 DIAGNOSIS — M6281 Muscle weakness (generalized): Secondary | ICD-10-CM | POA: Diagnosis not present

## 2017-04-29 DIAGNOSIS — M6281 Muscle weakness (generalized): Secondary | ICD-10-CM | POA: Diagnosis not present

## 2017-04-29 DIAGNOSIS — R262 Difficulty in walking, not elsewhere classified: Secondary | ICD-10-CM | POA: Diagnosis not present

## 2017-04-29 DIAGNOSIS — R1310 Dysphagia, unspecified: Secondary | ICD-10-CM | POA: Diagnosis not present

## 2017-04-29 DIAGNOSIS — R488 Other symbolic dysfunctions: Secondary | ICD-10-CM | POA: Diagnosis not present

## 2017-04-29 DIAGNOSIS — I69351 Hemiplegia and hemiparesis following cerebral infarction affecting right dominant side: Secondary | ICD-10-CM | POA: Diagnosis not present

## 2017-04-29 DIAGNOSIS — G8191 Hemiplegia, unspecified affecting right dominant side: Secondary | ICD-10-CM | POA: Diagnosis not present

## 2017-05-01 DIAGNOSIS — R262 Difficulty in walking, not elsewhere classified: Secondary | ICD-10-CM | POA: Diagnosis not present

## 2017-05-01 DIAGNOSIS — R488 Other symbolic dysfunctions: Secondary | ICD-10-CM | POA: Diagnosis not present

## 2017-05-01 DIAGNOSIS — R1310 Dysphagia, unspecified: Secondary | ICD-10-CM | POA: Diagnosis not present

## 2017-05-01 DIAGNOSIS — G8191 Hemiplegia, unspecified affecting right dominant side: Secondary | ICD-10-CM | POA: Diagnosis not present

## 2017-05-01 DIAGNOSIS — I69351 Hemiplegia and hemiparesis following cerebral infarction affecting right dominant side: Secondary | ICD-10-CM | POA: Diagnosis not present

## 2017-05-01 DIAGNOSIS — M6281 Muscle weakness (generalized): Secondary | ICD-10-CM | POA: Diagnosis not present

## 2017-05-02 DIAGNOSIS — G40909 Epilepsy, unspecified, not intractable, without status epilepticus: Secondary | ICD-10-CM | POA: Diagnosis not present

## 2017-05-02 DIAGNOSIS — R488 Other symbolic dysfunctions: Secondary | ICD-10-CM | POA: Diagnosis not present

## 2017-05-02 DIAGNOSIS — E46 Unspecified protein-calorie malnutrition: Secondary | ICD-10-CM | POA: Diagnosis not present

## 2017-05-02 DIAGNOSIS — E785 Hyperlipidemia, unspecified: Secondary | ICD-10-CM | POA: Diagnosis not present

## 2017-05-02 DIAGNOSIS — I69351 Hemiplegia and hemiparesis following cerebral infarction affecting right dominant side: Secondary | ICD-10-CM | POA: Diagnosis not present

## 2017-05-02 DIAGNOSIS — M6281 Muscle weakness (generalized): Secondary | ICD-10-CM | POA: Diagnosis not present

## 2017-05-02 DIAGNOSIS — G8191 Hemiplegia, unspecified affecting right dominant side: Secondary | ICD-10-CM | POA: Diagnosis not present

## 2017-05-02 DIAGNOSIS — R1310 Dysphagia, unspecified: Secondary | ICD-10-CM | POA: Diagnosis not present

## 2017-05-02 DIAGNOSIS — J449 Chronic obstructive pulmonary disease, unspecified: Secondary | ICD-10-CM | POA: Diagnosis not present

## 2017-05-02 DIAGNOSIS — R262 Difficulty in walking, not elsewhere classified: Secondary | ICD-10-CM | POA: Diagnosis not present

## 2017-05-03 DIAGNOSIS — M6281 Muscle weakness (generalized): Secondary | ICD-10-CM | POA: Diagnosis not present

## 2017-05-03 DIAGNOSIS — I69351 Hemiplegia and hemiparesis following cerebral infarction affecting right dominant side: Secondary | ICD-10-CM | POA: Diagnosis not present

## 2017-05-03 DIAGNOSIS — R1310 Dysphagia, unspecified: Secondary | ICD-10-CM | POA: Diagnosis not present

## 2017-05-03 DIAGNOSIS — G8191 Hemiplegia, unspecified affecting right dominant side: Secondary | ICD-10-CM | POA: Diagnosis not present

## 2017-05-03 DIAGNOSIS — R262 Difficulty in walking, not elsewhere classified: Secondary | ICD-10-CM | POA: Diagnosis not present

## 2017-05-03 DIAGNOSIS — R488 Other symbolic dysfunctions: Secondary | ICD-10-CM | POA: Diagnosis not present

## 2017-05-04 DIAGNOSIS — M6281 Muscle weakness (generalized): Secondary | ICD-10-CM | POA: Diagnosis not present

## 2017-05-04 DIAGNOSIS — R1310 Dysphagia, unspecified: Secondary | ICD-10-CM | POA: Diagnosis not present

## 2017-05-04 DIAGNOSIS — R488 Other symbolic dysfunctions: Secondary | ICD-10-CM | POA: Diagnosis not present

## 2017-05-04 DIAGNOSIS — R262 Difficulty in walking, not elsewhere classified: Secondary | ICD-10-CM | POA: Diagnosis not present

## 2017-05-04 DIAGNOSIS — I69351 Hemiplegia and hemiparesis following cerebral infarction affecting right dominant side: Secondary | ICD-10-CM | POA: Diagnosis not present

## 2017-05-04 DIAGNOSIS — G8191 Hemiplegia, unspecified affecting right dominant side: Secondary | ICD-10-CM | POA: Diagnosis not present

## 2017-05-05 DIAGNOSIS — R262 Difficulty in walking, not elsewhere classified: Secondary | ICD-10-CM | POA: Diagnosis not present

## 2017-05-05 DIAGNOSIS — I69351 Hemiplegia and hemiparesis following cerebral infarction affecting right dominant side: Secondary | ICD-10-CM | POA: Diagnosis not present

## 2017-05-05 DIAGNOSIS — M6281 Muscle weakness (generalized): Secondary | ICD-10-CM | POA: Diagnosis not present

## 2017-05-05 DIAGNOSIS — R1310 Dysphagia, unspecified: Secondary | ICD-10-CM | POA: Diagnosis not present

## 2017-05-05 DIAGNOSIS — G8191 Hemiplegia, unspecified affecting right dominant side: Secondary | ICD-10-CM | POA: Diagnosis not present

## 2017-05-05 DIAGNOSIS — R488 Other symbolic dysfunctions: Secondary | ICD-10-CM | POA: Diagnosis not present

## 2017-05-08 DIAGNOSIS — R1310 Dysphagia, unspecified: Secondary | ICD-10-CM | POA: Diagnosis not present

## 2017-05-08 DIAGNOSIS — R262 Difficulty in walking, not elsewhere classified: Secondary | ICD-10-CM | POA: Diagnosis not present

## 2017-05-08 DIAGNOSIS — R488 Other symbolic dysfunctions: Secondary | ICD-10-CM | POA: Diagnosis not present

## 2017-05-08 DIAGNOSIS — I69351 Hemiplegia and hemiparesis following cerebral infarction affecting right dominant side: Secondary | ICD-10-CM | POA: Diagnosis not present

## 2017-05-08 DIAGNOSIS — M6281 Muscle weakness (generalized): Secondary | ICD-10-CM | POA: Diagnosis not present

## 2017-05-08 DIAGNOSIS — G8191 Hemiplegia, unspecified affecting right dominant side: Secondary | ICD-10-CM | POA: Diagnosis not present

## 2017-05-09 DIAGNOSIS — G8191 Hemiplegia, unspecified affecting right dominant side: Secondary | ICD-10-CM | POA: Diagnosis not present

## 2017-05-09 DIAGNOSIS — M6281 Muscle weakness (generalized): Secondary | ICD-10-CM | POA: Diagnosis not present

## 2017-05-09 DIAGNOSIS — R488 Other symbolic dysfunctions: Secondary | ICD-10-CM | POA: Diagnosis not present

## 2017-05-09 DIAGNOSIS — I69351 Hemiplegia and hemiparesis following cerebral infarction affecting right dominant side: Secondary | ICD-10-CM | POA: Diagnosis not present

## 2017-05-09 DIAGNOSIS — R1310 Dysphagia, unspecified: Secondary | ICD-10-CM | POA: Diagnosis not present

## 2017-05-09 DIAGNOSIS — R262 Difficulty in walking, not elsewhere classified: Secondary | ICD-10-CM | POA: Diagnosis not present

## 2017-05-10 DIAGNOSIS — G8191 Hemiplegia, unspecified affecting right dominant side: Secondary | ICD-10-CM | POA: Diagnosis not present

## 2017-05-10 DIAGNOSIS — R1310 Dysphagia, unspecified: Secondary | ICD-10-CM | POA: Diagnosis not present

## 2017-05-10 DIAGNOSIS — I69351 Hemiplegia and hemiparesis following cerebral infarction affecting right dominant side: Secondary | ICD-10-CM | POA: Diagnosis not present

## 2017-05-10 DIAGNOSIS — M6281 Muscle weakness (generalized): Secondary | ICD-10-CM | POA: Diagnosis not present

## 2017-05-10 DIAGNOSIS — R488 Other symbolic dysfunctions: Secondary | ICD-10-CM | POA: Diagnosis not present

## 2017-05-10 DIAGNOSIS — R262 Difficulty in walking, not elsewhere classified: Secondary | ICD-10-CM | POA: Diagnosis not present

## 2017-05-11 DIAGNOSIS — M6281 Muscle weakness (generalized): Secondary | ICD-10-CM | POA: Diagnosis not present

## 2017-05-11 DIAGNOSIS — R262 Difficulty in walking, not elsewhere classified: Secondary | ICD-10-CM | POA: Diagnosis not present

## 2017-05-11 DIAGNOSIS — J449 Chronic obstructive pulmonary disease, unspecified: Secondary | ICD-10-CM | POA: Diagnosis not present

## 2017-05-11 DIAGNOSIS — R488 Other symbolic dysfunctions: Secondary | ICD-10-CM | POA: Diagnosis not present

## 2017-05-11 DIAGNOSIS — G40909 Epilepsy, unspecified, not intractable, without status epilepticus: Secondary | ICD-10-CM | POA: Diagnosis not present

## 2017-05-11 DIAGNOSIS — I6521 Occlusion and stenosis of right carotid artery: Secondary | ICD-10-CM | POA: Diagnosis not present

## 2017-05-11 DIAGNOSIS — I69351 Hemiplegia and hemiparesis following cerebral infarction affecting right dominant side: Secondary | ICD-10-CM | POA: Diagnosis not present

## 2017-05-11 DIAGNOSIS — G8191 Hemiplegia, unspecified affecting right dominant side: Secondary | ICD-10-CM | POA: Diagnosis not present

## 2017-05-11 DIAGNOSIS — R1310 Dysphagia, unspecified: Secondary | ICD-10-CM | POA: Diagnosis not present

## 2017-05-12 DIAGNOSIS — E559 Vitamin D deficiency, unspecified: Secondary | ICD-10-CM | POA: Diagnosis not present

## 2017-05-12 DIAGNOSIS — E039 Hypothyroidism, unspecified: Secondary | ICD-10-CM | POA: Diagnosis not present

## 2017-05-12 DIAGNOSIS — E785 Hyperlipidemia, unspecified: Secondary | ICD-10-CM | POA: Diagnosis not present

## 2017-05-12 DIAGNOSIS — R7989 Other specified abnormal findings of blood chemistry: Secondary | ICD-10-CM | POA: Diagnosis not present

## 2017-05-17 DIAGNOSIS — R319 Hematuria, unspecified: Secondary | ICD-10-CM | POA: Diagnosis not present

## 2017-05-17 DIAGNOSIS — F419 Anxiety disorder, unspecified: Secondary | ICD-10-CM | POA: Diagnosis not present

## 2017-05-17 DIAGNOSIS — R7989 Other specified abnormal findings of blood chemistry: Secondary | ICD-10-CM | POA: Diagnosis not present

## 2017-06-08 DIAGNOSIS — F329 Major depressive disorder, single episode, unspecified: Secondary | ICD-10-CM | POA: Diagnosis not present

## 2017-06-08 DIAGNOSIS — J449 Chronic obstructive pulmonary disease, unspecified: Secondary | ICD-10-CM | POA: Diagnosis not present

## 2017-06-08 DIAGNOSIS — F419 Anxiety disorder, unspecified: Secondary | ICD-10-CM | POA: Diagnosis not present

## 2017-06-08 DIAGNOSIS — G8191 Hemiplegia, unspecified affecting right dominant side: Secondary | ICD-10-CM | POA: Diagnosis not present

## 2017-06-15 DIAGNOSIS — G8191 Hemiplegia, unspecified affecting right dominant side: Secondary | ICD-10-CM | POA: Diagnosis not present

## 2017-06-15 DIAGNOSIS — J449 Chronic obstructive pulmonary disease, unspecified: Secondary | ICD-10-CM | POA: Diagnosis not present

## 2017-06-15 DIAGNOSIS — G40909 Epilepsy, unspecified, not intractable, without status epilepticus: Secondary | ICD-10-CM | POA: Diagnosis not present

## 2017-06-15 DIAGNOSIS — E46 Unspecified protein-calorie malnutrition: Secondary | ICD-10-CM | POA: Diagnosis not present

## 2017-06-23 DIAGNOSIS — R05 Cough: Secondary | ICD-10-CM | POA: Diagnosis not present

## 2017-06-23 DIAGNOSIS — R51 Headache: Secondary | ICD-10-CM | POA: Diagnosis not present

## 2017-06-23 DIAGNOSIS — J449 Chronic obstructive pulmonary disease, unspecified: Secondary | ICD-10-CM | POA: Diagnosis not present

## 2017-06-23 DIAGNOSIS — H6090 Unspecified otitis externa, unspecified ear: Secondary | ICD-10-CM | POA: Diagnosis not present

## 2017-06-28 DIAGNOSIS — B351 Tinea unguium: Secondary | ICD-10-CM | POA: Diagnosis not present

## 2017-06-28 DIAGNOSIS — L603 Nail dystrophy: Secondary | ICD-10-CM | POA: Diagnosis not present

## 2017-06-28 DIAGNOSIS — I739 Peripheral vascular disease, unspecified: Secondary | ICD-10-CM | POA: Diagnosis not present

## 2017-06-29 DIAGNOSIS — R05 Cough: Secondary | ICD-10-CM | POA: Diagnosis not present

## 2017-07-05 DIAGNOSIS — G8191 Hemiplegia, unspecified affecting right dominant side: Secondary | ICD-10-CM | POA: Diagnosis not present

## 2017-07-05 DIAGNOSIS — J449 Chronic obstructive pulmonary disease, unspecified: Secondary | ICD-10-CM | POA: Diagnosis not present

## 2017-07-05 DIAGNOSIS — E785 Hyperlipidemia, unspecified: Secondary | ICD-10-CM | POA: Diagnosis not present

## 2017-07-05 DIAGNOSIS — R2681 Unsteadiness on feet: Secondary | ICD-10-CM | POA: Diagnosis not present

## 2017-07-06 DIAGNOSIS — R05 Cough: Secondary | ICD-10-CM | POA: Diagnosis not present

## 2017-07-06 DIAGNOSIS — H1130 Conjunctival hemorrhage, unspecified eye: Secondary | ICD-10-CM | POA: Diagnosis not present

## 2017-07-20 DIAGNOSIS — G40909 Epilepsy, unspecified, not intractable, without status epilepticus: Secondary | ICD-10-CM | POA: Diagnosis not present

## 2017-07-20 DIAGNOSIS — I10 Essential (primary) hypertension: Secondary | ICD-10-CM | POA: Diagnosis not present

## 2017-07-20 DIAGNOSIS — J449 Chronic obstructive pulmonary disease, unspecified: Secondary | ICD-10-CM | POA: Diagnosis not present

## 2017-07-20 DIAGNOSIS — E46 Unspecified protein-calorie malnutrition: Secondary | ICD-10-CM | POA: Diagnosis not present

## 2017-07-28 DIAGNOSIS — G44209 Tension-type headache, unspecified, not intractable: Secondary | ICD-10-CM | POA: Diagnosis not present

## 2017-07-29 DIAGNOSIS — H43813 Vitreous degeneration, bilateral: Secondary | ICD-10-CM | POA: Diagnosis not present

## 2017-07-29 DIAGNOSIS — F329 Major depressive disorder, single episode, unspecified: Secondary | ICD-10-CM | POA: Diagnosis not present

## 2017-07-29 DIAGNOSIS — H04123 Dry eye syndrome of bilateral lacrimal glands: Secondary | ICD-10-CM | POA: Diagnosis not present

## 2017-07-29 DIAGNOSIS — J449 Chronic obstructive pulmonary disease, unspecified: Secondary | ICD-10-CM | POA: Diagnosis not present

## 2017-07-29 DIAGNOSIS — H539 Unspecified visual disturbance: Secondary | ICD-10-CM | POA: Diagnosis not present

## 2017-07-29 DIAGNOSIS — H2513 Age-related nuclear cataract, bilateral: Secondary | ICD-10-CM | POA: Diagnosis not present

## 2017-07-29 DIAGNOSIS — E785 Hyperlipidemia, unspecified: Secondary | ICD-10-CM | POA: Diagnosis not present

## 2017-07-29 DIAGNOSIS — G8191 Hemiplegia, unspecified affecting right dominant side: Secondary | ICD-10-CM | POA: Diagnosis not present

## 2017-08-21 DIAGNOSIS — E559 Vitamin D deficiency, unspecified: Secondary | ICD-10-CM | POA: Diagnosis not present

## 2017-08-21 DIAGNOSIS — I10 Essential (primary) hypertension: Secondary | ICD-10-CM | POA: Diagnosis not present

## 2017-08-21 DIAGNOSIS — E46 Unspecified protein-calorie malnutrition: Secondary | ICD-10-CM | POA: Diagnosis not present

## 2017-08-21 DIAGNOSIS — J449 Chronic obstructive pulmonary disease, unspecified: Secondary | ICD-10-CM | POA: Diagnosis not present

## 2017-08-26 DIAGNOSIS — I6930 Unspecified sequelae of cerebral infarction: Secondary | ICD-10-CM | POA: Diagnosis not present

## 2017-08-26 DIAGNOSIS — G8191 Hemiplegia, unspecified affecting right dominant side: Secondary | ICD-10-CM | POA: Diagnosis not present

## 2017-08-26 DIAGNOSIS — F329 Major depressive disorder, single episode, unspecified: Secondary | ICD-10-CM | POA: Diagnosis not present

## 2017-08-26 DIAGNOSIS — G44209 Tension-type headache, unspecified, not intractable: Secondary | ICD-10-CM | POA: Diagnosis not present

## 2017-08-29 DIAGNOSIS — D649 Anemia, unspecified: Secondary | ICD-10-CM | POA: Diagnosis not present

## 2017-08-29 DIAGNOSIS — E039 Hypothyroidism, unspecified: Secondary | ICD-10-CM | POA: Diagnosis not present

## 2017-08-29 DIAGNOSIS — R7989 Other specified abnormal findings of blood chemistry: Secondary | ICD-10-CM | POA: Diagnosis not present

## 2017-08-29 DIAGNOSIS — E785 Hyperlipidemia, unspecified: Secondary | ICD-10-CM | POA: Diagnosis not present

## 2017-08-29 DIAGNOSIS — E119 Type 2 diabetes mellitus without complications: Secondary | ICD-10-CM | POA: Diagnosis not present

## 2017-08-29 DIAGNOSIS — E559 Vitamin D deficiency, unspecified: Secondary | ICD-10-CM | POA: Diagnosis not present

## 2017-09-05 DIAGNOSIS — R7989 Other specified abnormal findings of blood chemistry: Secondary | ICD-10-CM | POA: Diagnosis not present

## 2017-09-05 DIAGNOSIS — D649 Anemia, unspecified: Secondary | ICD-10-CM | POA: Diagnosis not present

## 2017-09-07 DIAGNOSIS — I739 Peripheral vascular disease, unspecified: Secondary | ICD-10-CM | POA: Diagnosis not present

## 2017-09-07 DIAGNOSIS — L603 Nail dystrophy: Secondary | ICD-10-CM | POA: Diagnosis not present

## 2017-09-18 DIAGNOSIS — E46 Unspecified protein-calorie malnutrition: Secondary | ICD-10-CM | POA: Diagnosis not present

## 2017-09-18 DIAGNOSIS — G40909 Epilepsy, unspecified, not intractable, without status epilepticus: Secondary | ICD-10-CM | POA: Diagnosis not present

## 2017-09-18 DIAGNOSIS — J449 Chronic obstructive pulmonary disease, unspecified: Secondary | ICD-10-CM | POA: Diagnosis not present

## 2017-09-18 DIAGNOSIS — I10 Essential (primary) hypertension: Secondary | ICD-10-CM | POA: Diagnosis not present

## 2017-09-28 DIAGNOSIS — G8191 Hemiplegia, unspecified affecting right dominant side: Secondary | ICD-10-CM | POA: Diagnosis not present

## 2017-09-28 DIAGNOSIS — G44209 Tension-type headache, unspecified, not intractable: Secondary | ICD-10-CM | POA: Diagnosis not present

## 2017-09-28 DIAGNOSIS — I6930 Unspecified sequelae of cerebral infarction: Secondary | ICD-10-CM | POA: Diagnosis not present

## 2017-09-28 DIAGNOSIS — F329 Major depressive disorder, single episode, unspecified: Secondary | ICD-10-CM | POA: Diagnosis not present

## 2017-10-11 DIAGNOSIS — G8191 Hemiplegia, unspecified affecting right dominant side: Secondary | ICD-10-CM | POA: Diagnosis not present

## 2017-10-11 DIAGNOSIS — I10 Essential (primary) hypertension: Secondary | ICD-10-CM | POA: Diagnosis not present

## 2017-10-11 DIAGNOSIS — J449 Chronic obstructive pulmonary disease, unspecified: Secondary | ICD-10-CM | POA: Diagnosis not present

## 2017-10-11 DIAGNOSIS — G40909 Epilepsy, unspecified, not intractable, without status epilepticus: Secondary | ICD-10-CM | POA: Diagnosis not present

## 2017-10-26 DIAGNOSIS — F419 Anxiety disorder, unspecified: Secondary | ICD-10-CM | POA: Diagnosis not present

## 2017-10-26 DIAGNOSIS — G4700 Insomnia, unspecified: Secondary | ICD-10-CM | POA: Diagnosis not present

## 2017-10-26 DIAGNOSIS — R51 Headache: Secondary | ICD-10-CM | POA: Diagnosis not present

## 2017-10-26 DIAGNOSIS — F329 Major depressive disorder, single episode, unspecified: Secondary | ICD-10-CM | POA: Diagnosis not present

## 2017-11-12 DIAGNOSIS — J449 Chronic obstructive pulmonary disease, unspecified: Secondary | ICD-10-CM | POA: Diagnosis not present

## 2017-11-12 DIAGNOSIS — G40909 Epilepsy, unspecified, not intractable, without status epilepticus: Secondary | ICD-10-CM | POA: Diagnosis not present

## 2017-11-12 DIAGNOSIS — E46 Unspecified protein-calorie malnutrition: Secondary | ICD-10-CM | POA: Diagnosis not present

## 2017-11-12 DIAGNOSIS — F329 Major depressive disorder, single episode, unspecified: Secondary | ICD-10-CM | POA: Diagnosis not present

## 2017-11-18 DIAGNOSIS — F329 Major depressive disorder, single episode, unspecified: Secondary | ICD-10-CM | POA: Diagnosis not present

## 2017-11-18 DIAGNOSIS — F419 Anxiety disorder, unspecified: Secondary | ICD-10-CM | POA: Diagnosis not present

## 2017-11-18 DIAGNOSIS — R51 Headache: Secondary | ICD-10-CM | POA: Diagnosis not present

## 2017-11-18 DIAGNOSIS — G4700 Insomnia, unspecified: Secondary | ICD-10-CM | POA: Diagnosis not present

## 2017-11-19 DIAGNOSIS — R7989 Other specified abnormal findings of blood chemistry: Secondary | ICD-10-CM | POA: Diagnosis not present

## 2017-11-19 DIAGNOSIS — E119 Type 2 diabetes mellitus without complications: Secondary | ICD-10-CM | POA: Diagnosis not present

## 2017-11-19 DIAGNOSIS — E785 Hyperlipidemia, unspecified: Secondary | ICD-10-CM | POA: Diagnosis not present

## 2017-11-19 DIAGNOSIS — E559 Vitamin D deficiency, unspecified: Secondary | ICD-10-CM | POA: Diagnosis not present

## 2017-11-19 DIAGNOSIS — E039 Hypothyroidism, unspecified: Secondary | ICD-10-CM | POA: Diagnosis not present

## 2017-12-06 DIAGNOSIS — I739 Peripheral vascular disease, unspecified: Secondary | ICD-10-CM | POA: Diagnosis not present

## 2017-12-06 DIAGNOSIS — B351 Tinea unguium: Secondary | ICD-10-CM | POA: Diagnosis not present

## 2017-12-06 DIAGNOSIS — L603 Nail dystrophy: Secondary | ICD-10-CM | POA: Diagnosis not present

## 2017-12-12 DIAGNOSIS — I6930 Unspecified sequelae of cerebral infarction: Secondary | ICD-10-CM | POA: Diagnosis not present

## 2017-12-12 DIAGNOSIS — E46 Unspecified protein-calorie malnutrition: Secondary | ICD-10-CM | POA: Diagnosis not present

## 2017-12-12 DIAGNOSIS — I10 Essential (primary) hypertension: Secondary | ICD-10-CM | POA: Diagnosis not present

## 2017-12-12 DIAGNOSIS — J449 Chronic obstructive pulmonary disease, unspecified: Secondary | ICD-10-CM | POA: Diagnosis not present

## 2017-12-19 DIAGNOSIS — F419 Anxiety disorder, unspecified: Secondary | ICD-10-CM | POA: Diagnosis not present

## 2017-12-19 DIAGNOSIS — R51 Headache: Secondary | ICD-10-CM | POA: Diagnosis not present

## 2017-12-19 DIAGNOSIS — G4700 Insomnia, unspecified: Secondary | ICD-10-CM | POA: Diagnosis not present

## 2017-12-19 DIAGNOSIS — F329 Major depressive disorder, single episode, unspecified: Secondary | ICD-10-CM | POA: Diagnosis not present

## 2018-01-13 DIAGNOSIS — F419 Anxiety disorder, unspecified: Secondary | ICD-10-CM | POA: Diagnosis not present

## 2018-01-13 DIAGNOSIS — R51 Headache: Secondary | ICD-10-CM | POA: Diagnosis not present

## 2018-01-13 DIAGNOSIS — J449 Chronic obstructive pulmonary disease, unspecified: Secondary | ICD-10-CM | POA: Diagnosis not present

## 2018-01-13 DIAGNOSIS — F329 Major depressive disorder, single episode, unspecified: Secondary | ICD-10-CM | POA: Diagnosis not present

## 2018-01-29 DIAGNOSIS — G40909 Epilepsy, unspecified, not intractable, without status epilepticus: Secondary | ICD-10-CM | POA: Diagnosis not present

## 2018-01-29 DIAGNOSIS — I10 Essential (primary) hypertension: Secondary | ICD-10-CM | POA: Diagnosis not present

## 2018-01-29 DIAGNOSIS — E46 Unspecified protein-calorie malnutrition: Secondary | ICD-10-CM | POA: Diagnosis not present

## 2018-01-29 DIAGNOSIS — J449 Chronic obstructive pulmonary disease, unspecified: Secondary | ICD-10-CM | POA: Diagnosis not present

## 2018-01-29 DIAGNOSIS — Z0000 Encounter for general adult medical examination without abnormal findings: Secondary | ICD-10-CM | POA: Diagnosis not present

## 2018-02-13 DIAGNOSIS — E785 Hyperlipidemia, unspecified: Secondary | ICD-10-CM | POA: Diagnosis not present

## 2018-02-13 DIAGNOSIS — F489 Nonpsychotic mental disorder, unspecified: Secondary | ICD-10-CM | POA: Diagnosis not present

## 2018-02-13 DIAGNOSIS — E46 Unspecified protein-calorie malnutrition: Secondary | ICD-10-CM | POA: Diagnosis not present

## 2018-02-13 DIAGNOSIS — J449 Chronic obstructive pulmonary disease, unspecified: Secondary | ICD-10-CM | POA: Diagnosis not present

## 2018-02-17 DIAGNOSIS — E559 Vitamin D deficiency, unspecified: Secondary | ICD-10-CM | POA: Diagnosis not present

## 2018-02-17 DIAGNOSIS — F329 Major depressive disorder, single episode, unspecified: Secondary | ICD-10-CM | POA: Diagnosis not present

## 2018-02-17 DIAGNOSIS — G4700 Insomnia, unspecified: Secondary | ICD-10-CM | POA: Diagnosis not present

## 2018-02-17 DIAGNOSIS — R51 Headache: Secondary | ICD-10-CM | POA: Diagnosis not present

## 2018-02-22 DIAGNOSIS — R0602 Shortness of breath: Secondary | ICD-10-CM | POA: Diagnosis not present

## 2018-02-22 DIAGNOSIS — R6883 Chills (without fever): Secondary | ICD-10-CM | POA: Diagnosis not present

## 2018-02-22 DIAGNOSIS — R0981 Nasal congestion: Secondary | ICD-10-CM | POA: Diagnosis not present

## 2018-02-22 DIAGNOSIS — R51 Headache: Secondary | ICD-10-CM | POA: Diagnosis not present

## 2018-02-22 DIAGNOSIS — R05 Cough: Secondary | ICD-10-CM | POA: Diagnosis not present

## 2018-02-23 DIAGNOSIS — E559 Vitamin D deficiency, unspecified: Secondary | ICD-10-CM | POA: Diagnosis not present

## 2018-02-23 DIAGNOSIS — Z1329 Encounter for screening for other suspected endocrine disorder: Secondary | ICD-10-CM | POA: Diagnosis not present

## 2018-03-17 DIAGNOSIS — B351 Tinea unguium: Secondary | ICD-10-CM | POA: Diagnosis not present

## 2018-03-17 DIAGNOSIS — L603 Nail dystrophy: Secondary | ICD-10-CM | POA: Diagnosis not present

## 2018-03-17 DIAGNOSIS — I739 Peripheral vascular disease, unspecified: Secondary | ICD-10-CM | POA: Diagnosis not present

## 2018-04-02 DIAGNOSIS — J449 Chronic obstructive pulmonary disease, unspecified: Secondary | ICD-10-CM | POA: Diagnosis not present

## 2018-04-02 DIAGNOSIS — E46 Unspecified protein-calorie malnutrition: Secondary | ICD-10-CM | POA: Diagnosis not present

## 2018-04-02 DIAGNOSIS — I10 Essential (primary) hypertension: Secondary | ICD-10-CM | POA: Diagnosis not present

## 2018-04-02 DIAGNOSIS — G40909 Epilepsy, unspecified, not intractable, without status epilepticus: Secondary | ICD-10-CM | POA: Diagnosis not present

## 2018-04-20 DIAGNOSIS — R05 Cough: Secondary | ICD-10-CM | POA: Diagnosis not present

## 2018-04-24 DIAGNOSIS — J441 Chronic obstructive pulmonary disease with (acute) exacerbation: Secondary | ICD-10-CM | POA: Diagnosis not present

## 2018-04-24 DIAGNOSIS — J449 Chronic obstructive pulmonary disease, unspecified: Secondary | ICD-10-CM | POA: Diagnosis not present

## 2018-04-24 DIAGNOSIS — I6521 Occlusion and stenosis of right carotid artery: Secondary | ICD-10-CM | POA: Diagnosis not present

## 2018-04-24 DIAGNOSIS — R51 Headache: Secondary | ICD-10-CM | POA: Diagnosis not present

## 2018-04-25 DIAGNOSIS — R6889 Other general symptoms and signs: Secondary | ICD-10-CM | POA: Diagnosis not present

## 2018-04-25 DIAGNOSIS — E785 Hyperlipidemia, unspecified: Secondary | ICD-10-CM | POA: Diagnosis not present

## 2018-04-25 DIAGNOSIS — E119 Type 2 diabetes mellitus without complications: Secondary | ICD-10-CM | POA: Diagnosis not present

## 2018-04-25 DIAGNOSIS — E039 Hypothyroidism, unspecified: Secondary | ICD-10-CM | POA: Diagnosis not present

## 2018-04-25 DIAGNOSIS — E559 Vitamin D deficiency, unspecified: Secondary | ICD-10-CM | POA: Diagnosis not present

## 2018-04-25 DIAGNOSIS — D649 Anemia, unspecified: Secondary | ICD-10-CM | POA: Diagnosis not present

## 2018-04-27 DIAGNOSIS — I10 Essential (primary) hypertension: Secondary | ICD-10-CM | POA: Diagnosis not present

## 2018-04-27 DIAGNOSIS — E559 Vitamin D deficiency, unspecified: Secondary | ICD-10-CM | POA: Diagnosis not present

## 2018-04-27 DIAGNOSIS — J449 Chronic obstructive pulmonary disease, unspecified: Secondary | ICD-10-CM | POA: Diagnosis not present

## 2018-04-27 DIAGNOSIS — G40909 Epilepsy, unspecified, not intractable, without status epilepticus: Secondary | ICD-10-CM | POA: Diagnosis not present

## 2018-05-08 DIAGNOSIS — F489 Nonpsychotic mental disorder, unspecified: Secondary | ICD-10-CM | POA: Diagnosis not present

## 2018-05-08 DIAGNOSIS — R4689 Other symptoms and signs involving appearance and behavior: Secondary | ICD-10-CM | POA: Diagnosis not present

## 2018-05-14 DIAGNOSIS — E46 Unspecified protein-calorie malnutrition: Secondary | ICD-10-CM | POA: Diagnosis not present

## 2018-05-14 DIAGNOSIS — E785 Hyperlipidemia, unspecified: Secondary | ICD-10-CM | POA: Diagnosis not present

## 2018-05-14 DIAGNOSIS — G40909 Epilepsy, unspecified, not intractable, without status epilepticus: Secondary | ICD-10-CM | POA: Diagnosis not present

## 2018-05-14 DIAGNOSIS — J449 Chronic obstructive pulmonary disease, unspecified: Secondary | ICD-10-CM | POA: Diagnosis not present

## 2018-05-25 DIAGNOSIS — I6521 Occlusion and stenosis of right carotid artery: Secondary | ICD-10-CM | POA: Diagnosis not present

## 2018-05-25 DIAGNOSIS — J449 Chronic obstructive pulmonary disease, unspecified: Secondary | ICD-10-CM | POA: Diagnosis not present

## 2018-05-25 DIAGNOSIS — R51 Headache: Secondary | ICD-10-CM | POA: Diagnosis not present

## 2018-05-25 DIAGNOSIS — F489 Nonpsychotic mental disorder, unspecified: Secondary | ICD-10-CM | POA: Diagnosis not present

## 2018-06-14 DIAGNOSIS — G40909 Epilepsy, unspecified, not intractable, without status epilepticus: Secondary | ICD-10-CM | POA: Diagnosis not present

## 2018-06-14 DIAGNOSIS — J449 Chronic obstructive pulmonary disease, unspecified: Secondary | ICD-10-CM | POA: Diagnosis not present

## 2018-06-14 DIAGNOSIS — E46 Unspecified protein-calorie malnutrition: Secondary | ICD-10-CM | POA: Diagnosis not present

## 2018-06-14 DIAGNOSIS — E785 Hyperlipidemia, unspecified: Secondary | ICD-10-CM | POA: Diagnosis not present

## 2018-06-23 DIAGNOSIS — B351 Tinea unguium: Secondary | ICD-10-CM | POA: Diagnosis not present

## 2018-06-23 DIAGNOSIS — I739 Peripheral vascular disease, unspecified: Secondary | ICD-10-CM | POA: Diagnosis not present

## 2018-06-23 DIAGNOSIS — L603 Nail dystrophy: Secondary | ICD-10-CM | POA: Diagnosis not present

## 2018-07-01 DIAGNOSIS — J449 Chronic obstructive pulmonary disease, unspecified: Secondary | ICD-10-CM | POA: Diagnosis not present

## 2018-07-01 DIAGNOSIS — I10 Essential (primary) hypertension: Secondary | ICD-10-CM | POA: Diagnosis not present

## 2018-07-01 DIAGNOSIS — E785 Hyperlipidemia, unspecified: Secondary | ICD-10-CM | POA: Diagnosis not present

## 2018-07-09 DIAGNOSIS — G40909 Epilepsy, unspecified, not intractable, without status epilepticus: Secondary | ICD-10-CM | POA: Diagnosis not present

## 2018-07-09 DIAGNOSIS — E785 Hyperlipidemia, unspecified: Secondary | ICD-10-CM | POA: Diagnosis not present

## 2018-07-09 DIAGNOSIS — J449 Chronic obstructive pulmonary disease, unspecified: Secondary | ICD-10-CM | POA: Diagnosis not present

## 2018-07-09 DIAGNOSIS — I6521 Occlusion and stenosis of right carotid artery: Secondary | ICD-10-CM | POA: Diagnosis not present

## 2018-07-21 DIAGNOSIS — E559 Vitamin D deficiency, unspecified: Secondary | ICD-10-CM | POA: Diagnosis not present

## 2018-07-21 DIAGNOSIS — J449 Chronic obstructive pulmonary disease, unspecified: Secondary | ICD-10-CM | POA: Diagnosis not present

## 2018-07-21 DIAGNOSIS — E785 Hyperlipidemia, unspecified: Secondary | ICD-10-CM | POA: Diagnosis not present

## 2018-07-21 DIAGNOSIS — I6521 Occlusion and stenosis of right carotid artery: Secondary | ICD-10-CM | POA: Diagnosis not present

## 2018-07-22 DIAGNOSIS — R6889 Other general symptoms and signs: Secondary | ICD-10-CM | POA: Diagnosis not present

## 2018-07-22 DIAGNOSIS — D649 Anemia, unspecified: Secondary | ICD-10-CM | POA: Diagnosis not present

## 2018-07-22 DIAGNOSIS — E559 Vitamin D deficiency, unspecified: Secondary | ICD-10-CM | POA: Diagnosis not present

## 2018-07-27 DIAGNOSIS — Z20828 Contact with and (suspected) exposure to other viral communicable diseases: Secondary | ICD-10-CM | POA: Diagnosis not present

## 2018-07-29 DIAGNOSIS — I10 Essential (primary) hypertension: Secondary | ICD-10-CM | POA: Diagnosis not present

## 2018-07-29 DIAGNOSIS — E785 Hyperlipidemia, unspecified: Secondary | ICD-10-CM | POA: Diagnosis not present

## 2018-07-29 DIAGNOSIS — J449 Chronic obstructive pulmonary disease, unspecified: Secondary | ICD-10-CM | POA: Diagnosis not present

## 2018-08-04 DIAGNOSIS — Z20828 Contact with and (suspected) exposure to other viral communicable diseases: Secondary | ICD-10-CM | POA: Diagnosis not present

## 2018-08-08 DIAGNOSIS — Z20828 Contact with and (suspected) exposure to other viral communicable diseases: Secondary | ICD-10-CM | POA: Diagnosis not present

## 2018-08-15 DIAGNOSIS — Z20828 Contact with and (suspected) exposure to other viral communicable diseases: Secondary | ICD-10-CM | POA: Diagnosis not present

## 2018-08-17 DIAGNOSIS — E559 Vitamin D deficiency, unspecified: Secondary | ICD-10-CM | POA: Diagnosis not present

## 2018-08-17 DIAGNOSIS — J449 Chronic obstructive pulmonary disease, unspecified: Secondary | ICD-10-CM | POA: Diagnosis not present

## 2018-08-17 DIAGNOSIS — R51 Headache: Secondary | ICD-10-CM | POA: Diagnosis not present

## 2018-08-17 DIAGNOSIS — G8191 Hemiplegia, unspecified affecting right dominant side: Secondary | ICD-10-CM | POA: Diagnosis not present

## 2018-08-20 DIAGNOSIS — E46 Unspecified protein-calorie malnutrition: Secondary | ICD-10-CM | POA: Diagnosis not present

## 2018-08-20 DIAGNOSIS — G40909 Epilepsy, unspecified, not intractable, without status epilepticus: Secondary | ICD-10-CM | POA: Diagnosis not present

## 2018-08-20 DIAGNOSIS — E785 Hyperlipidemia, unspecified: Secondary | ICD-10-CM | POA: Diagnosis not present

## 2018-08-20 DIAGNOSIS — J449 Chronic obstructive pulmonary disease, unspecified: Secondary | ICD-10-CM | POA: Diagnosis not present

## 2018-08-21 DIAGNOSIS — Z03818 Encounter for observation for suspected exposure to other biological agents ruled out: Secondary | ICD-10-CM | POA: Diagnosis not present

## 2018-08-29 DIAGNOSIS — J449 Chronic obstructive pulmonary disease, unspecified: Secondary | ICD-10-CM | POA: Diagnosis not present

## 2018-08-29 DIAGNOSIS — E785 Hyperlipidemia, unspecified: Secondary | ICD-10-CM | POA: Diagnosis not present

## 2018-08-29 DIAGNOSIS — I10 Essential (primary) hypertension: Secondary | ICD-10-CM | POA: Diagnosis not present

## 2018-09-08 DIAGNOSIS — B351 Tinea unguium: Secondary | ICD-10-CM | POA: Diagnosis not present

## 2018-09-08 DIAGNOSIS — I739 Peripheral vascular disease, unspecified: Secondary | ICD-10-CM | POA: Diagnosis not present

## 2018-09-08 DIAGNOSIS — L603 Nail dystrophy: Secondary | ICD-10-CM | POA: Diagnosis not present

## 2018-09-13 DIAGNOSIS — M6281 Muscle weakness (generalized): Secondary | ICD-10-CM | POA: Diagnosis not present

## 2018-09-13 DIAGNOSIS — Z993 Dependence on wheelchair: Secondary | ICD-10-CM | POA: Diagnosis not present

## 2018-09-14 DIAGNOSIS — J449 Chronic obstructive pulmonary disease, unspecified: Secondary | ICD-10-CM | POA: Diagnosis not present

## 2018-09-14 DIAGNOSIS — G8191 Hemiplegia, unspecified affecting right dominant side: Secondary | ICD-10-CM | POA: Diagnosis not present

## 2018-09-14 DIAGNOSIS — E785 Hyperlipidemia, unspecified: Secondary | ICD-10-CM | POA: Diagnosis not present

## 2018-09-14 DIAGNOSIS — I6521 Occlusion and stenosis of right carotid artery: Secondary | ICD-10-CM | POA: Diagnosis not present

## 2018-10-01 DIAGNOSIS — E785 Hyperlipidemia, unspecified: Secondary | ICD-10-CM | POA: Diagnosis not present

## 2018-10-01 DIAGNOSIS — G40909 Epilepsy, unspecified, not intractable, without status epilepticus: Secondary | ICD-10-CM | POA: Diagnosis not present

## 2018-10-01 DIAGNOSIS — E46 Unspecified protein-calorie malnutrition: Secondary | ICD-10-CM | POA: Diagnosis not present

## 2018-10-01 DIAGNOSIS — J449 Chronic obstructive pulmonary disease, unspecified: Secondary | ICD-10-CM | POA: Diagnosis not present

## 2018-10-05 DIAGNOSIS — J449 Chronic obstructive pulmonary disease, unspecified: Secondary | ICD-10-CM | POA: Diagnosis not present

## 2018-10-05 DIAGNOSIS — B9729 Other coronavirus as the cause of diseases classified elsewhere: Secondary | ICD-10-CM | POA: Diagnosis not present

## 2018-10-07 DIAGNOSIS — J22 Unspecified acute lower respiratory infection: Secondary | ICD-10-CM | POA: Diagnosis not present

## 2018-10-07 DIAGNOSIS — J069 Acute upper respiratory infection, unspecified: Secondary | ICD-10-CM | POA: Diagnosis not present

## 2018-10-07 DIAGNOSIS — E785 Hyperlipidemia, unspecified: Secondary | ICD-10-CM | POA: Diagnosis not present

## 2018-10-07 DIAGNOSIS — R7989 Other specified abnormal findings of blood chemistry: Secondary | ICD-10-CM | POA: Diagnosis not present

## 2018-10-07 DIAGNOSIS — D649 Anemia, unspecified: Secondary | ICD-10-CM | POA: Diagnosis not present

## 2018-10-07 DIAGNOSIS — E559 Vitamin D deficiency, unspecified: Secondary | ICD-10-CM | POA: Diagnosis not present

## 2018-10-12 DIAGNOSIS — E559 Vitamin D deficiency, unspecified: Secondary | ICD-10-CM | POA: Diagnosis not present

## 2018-10-12 DIAGNOSIS — R7989 Other specified abnormal findings of blood chemistry: Secondary | ICD-10-CM | POA: Diagnosis not present

## 2018-10-12 DIAGNOSIS — E785 Hyperlipidemia, unspecified: Secondary | ICD-10-CM | POA: Diagnosis not present

## 2018-10-12 DIAGNOSIS — J22 Unspecified acute lower respiratory infection: Secondary | ICD-10-CM | POA: Diagnosis not present

## 2018-10-12 DIAGNOSIS — D649 Anemia, unspecified: Secondary | ICD-10-CM | POA: Diagnosis not present

## 2018-10-12 DIAGNOSIS — J069 Acute upper respiratory infection, unspecified: Secondary | ICD-10-CM | POA: Diagnosis not present

## 2018-10-16 DIAGNOSIS — I6521 Occlusion and stenosis of right carotid artery: Secondary | ICD-10-CM | POA: Diagnosis not present

## 2018-10-16 DIAGNOSIS — G40909 Epilepsy, unspecified, not intractable, without status epilepticus: Secondary | ICD-10-CM | POA: Diagnosis not present

## 2018-10-16 DIAGNOSIS — G8191 Hemiplegia, unspecified affecting right dominant side: Secondary | ICD-10-CM | POA: Diagnosis not present

## 2018-10-16 DIAGNOSIS — B9729 Other coronavirus as the cause of diseases classified elsewhere: Secondary | ICD-10-CM | POA: Diagnosis not present

## 2018-10-19 DIAGNOSIS — J22 Unspecified acute lower respiratory infection: Secondary | ICD-10-CM | POA: Diagnosis not present

## 2018-10-19 DIAGNOSIS — R7989 Other specified abnormal findings of blood chemistry: Secondary | ICD-10-CM | POA: Diagnosis not present

## 2018-10-19 DIAGNOSIS — E785 Hyperlipidemia, unspecified: Secondary | ICD-10-CM | POA: Diagnosis not present

## 2018-10-19 DIAGNOSIS — D649 Anemia, unspecified: Secondary | ICD-10-CM | POA: Diagnosis not present

## 2018-10-19 DIAGNOSIS — E559 Vitamin D deficiency, unspecified: Secondary | ICD-10-CM | POA: Diagnosis not present

## 2018-10-19 DIAGNOSIS — J069 Acute upper respiratory infection, unspecified: Secondary | ICD-10-CM | POA: Diagnosis not present

## 2018-10-19 DIAGNOSIS — R1310 Dysphagia, unspecified: Secondary | ICD-10-CM | POA: Diagnosis not present

## 2018-10-21 DIAGNOSIS — G40909 Epilepsy, unspecified, not intractable, without status epilepticus: Secondary | ICD-10-CM | POA: Diagnosis not present

## 2018-10-21 DIAGNOSIS — E46 Unspecified protein-calorie malnutrition: Secondary | ICD-10-CM | POA: Diagnosis not present

## 2018-10-21 DIAGNOSIS — J449 Chronic obstructive pulmonary disease, unspecified: Secondary | ICD-10-CM | POA: Diagnosis not present

## 2018-10-21 DIAGNOSIS — E785 Hyperlipidemia, unspecified: Secondary | ICD-10-CM | POA: Diagnosis not present

## 2018-10-31 DIAGNOSIS — E785 Hyperlipidemia, unspecified: Secondary | ICD-10-CM | POA: Diagnosis not present

## 2018-10-31 DIAGNOSIS — J449 Chronic obstructive pulmonary disease, unspecified: Secondary | ICD-10-CM | POA: Diagnosis not present

## 2018-10-31 DIAGNOSIS — I10 Essential (primary) hypertension: Secondary | ICD-10-CM | POA: Diagnosis not present

## 2018-11-03 DIAGNOSIS — H40033 Anatomical narrow angle, bilateral: Secondary | ICD-10-CM | POA: Diagnosis not present

## 2018-11-03 DIAGNOSIS — H04123 Dry eye syndrome of bilateral lacrimal glands: Secondary | ICD-10-CM | POA: Diagnosis not present

## 2018-11-03 DIAGNOSIS — H25813 Combined forms of age-related cataract, bilateral: Secondary | ICD-10-CM | POA: Diagnosis not present

## 2018-11-03 DIAGNOSIS — H401134 Primary open-angle glaucoma, bilateral, indeterminate stage: Secondary | ICD-10-CM | POA: Diagnosis not present

## 2018-11-04 DIAGNOSIS — R296 Repeated falls: Secondary | ICD-10-CM | POA: Diagnosis not present

## 2018-11-04 DIAGNOSIS — R269 Unspecified abnormalities of gait and mobility: Secondary | ICD-10-CM | POA: Diagnosis not present

## 2018-11-07 DIAGNOSIS — Z9181 History of falling: Secondary | ICD-10-CM | POA: Diagnosis not present

## 2018-11-07 DIAGNOSIS — M6281 Muscle weakness (generalized): Secondary | ICD-10-CM | POA: Diagnosis not present

## 2018-11-16 DIAGNOSIS — G40909 Epilepsy, unspecified, not intractable, without status epilepticus: Secondary | ICD-10-CM | POA: Diagnosis not present

## 2018-11-16 DIAGNOSIS — G8191 Hemiplegia, unspecified affecting right dominant side: Secondary | ICD-10-CM | POA: Diagnosis not present

## 2018-11-16 DIAGNOSIS — J449 Chronic obstructive pulmonary disease, unspecified: Secondary | ICD-10-CM | POA: Diagnosis not present

## 2018-11-16 DIAGNOSIS — I6521 Occlusion and stenosis of right carotid artery: Secondary | ICD-10-CM | POA: Diagnosis not present

## 2018-11-19 DIAGNOSIS — E785 Hyperlipidemia, unspecified: Secondary | ICD-10-CM | POA: Diagnosis not present

## 2018-11-19 DIAGNOSIS — E559 Vitamin D deficiency, unspecified: Secondary | ICD-10-CM | POA: Diagnosis not present

## 2018-11-19 DIAGNOSIS — E46 Unspecified protein-calorie malnutrition: Secondary | ICD-10-CM | POA: Diagnosis not present

## 2018-11-19 DIAGNOSIS — J449 Chronic obstructive pulmonary disease, unspecified: Secondary | ICD-10-CM | POA: Diagnosis not present

## 2018-11-30 DIAGNOSIS — J449 Chronic obstructive pulmonary disease, unspecified: Secondary | ICD-10-CM | POA: Diagnosis not present

## 2018-11-30 DIAGNOSIS — E785 Hyperlipidemia, unspecified: Secondary | ICD-10-CM | POA: Diagnosis not present

## 2018-11-30 DIAGNOSIS — I10 Essential (primary) hypertension: Secondary | ICD-10-CM | POA: Diagnosis not present

## 2018-12-14 DIAGNOSIS — G8191 Hemiplegia, unspecified affecting right dominant side: Secondary | ICD-10-CM | POA: Diagnosis not present

## 2018-12-14 DIAGNOSIS — I6521 Occlusion and stenosis of right carotid artery: Secondary | ICD-10-CM | POA: Diagnosis not present

## 2018-12-14 DIAGNOSIS — J449 Chronic obstructive pulmonary disease, unspecified: Secondary | ICD-10-CM | POA: Diagnosis not present

## 2018-12-14 DIAGNOSIS — G40909 Epilepsy, unspecified, not intractable, without status epilepticus: Secondary | ICD-10-CM | POA: Diagnosis not present

## 2018-12-18 DIAGNOSIS — Z03818 Encounter for observation for suspected exposure to other biological agents ruled out: Secondary | ICD-10-CM | POA: Diagnosis not present

## 2018-12-25 DIAGNOSIS — Z03818 Encounter for observation for suspected exposure to other biological agents ruled out: Secondary | ICD-10-CM | POA: Diagnosis not present

## 2018-12-29 DIAGNOSIS — E785 Hyperlipidemia, unspecified: Secondary | ICD-10-CM | POA: Diagnosis not present

## 2018-12-29 DIAGNOSIS — J449 Chronic obstructive pulmonary disease, unspecified: Secondary | ICD-10-CM | POA: Diagnosis not present

## 2018-12-29 DIAGNOSIS — I10 Essential (primary) hypertension: Secondary | ICD-10-CM | POA: Diagnosis not present

## 2018-12-30 DIAGNOSIS — E46 Unspecified protein-calorie malnutrition: Secondary | ICD-10-CM | POA: Diagnosis not present

## 2018-12-30 DIAGNOSIS — J449 Chronic obstructive pulmonary disease, unspecified: Secondary | ICD-10-CM | POA: Diagnosis not present

## 2018-12-30 DIAGNOSIS — G40909 Epilepsy, unspecified, not intractable, without status epilepticus: Secondary | ICD-10-CM | POA: Diagnosis not present

## 2018-12-30 DIAGNOSIS — E785 Hyperlipidemia, unspecified: Secondary | ICD-10-CM | POA: Diagnosis not present

## 2019-01-01 DIAGNOSIS — Z03818 Encounter for observation for suspected exposure to other biological agents ruled out: Secondary | ICD-10-CM | POA: Diagnosis not present

## 2019-01-11 DIAGNOSIS — G8191 Hemiplegia, unspecified affecting right dominant side: Secondary | ICD-10-CM | POA: Diagnosis not present

## 2019-01-11 DIAGNOSIS — J449 Chronic obstructive pulmonary disease, unspecified: Secondary | ICD-10-CM | POA: Diagnosis not present

## 2019-01-11 DIAGNOSIS — G40909 Epilepsy, unspecified, not intractable, without status epilepticus: Secondary | ICD-10-CM | POA: Diagnosis not present

## 2019-01-11 DIAGNOSIS — I6521 Occlusion and stenosis of right carotid artery: Secondary | ICD-10-CM | POA: Diagnosis not present

## 2019-01-16 DIAGNOSIS — H40033 Anatomical narrow angle, bilateral: Secondary | ICD-10-CM | POA: Diagnosis not present

## 2019-01-16 DIAGNOSIS — H40053 Ocular hypertension, bilateral: Secondary | ICD-10-CM | POA: Diagnosis not present

## 2019-01-16 DIAGNOSIS — H25813 Combined forms of age-related cataract, bilateral: Secondary | ICD-10-CM | POA: Diagnosis not present

## 2019-01-16 DIAGNOSIS — H04123 Dry eye syndrome of bilateral lacrimal glands: Secondary | ICD-10-CM | POA: Diagnosis not present
